# Patient Record
Sex: Male | Born: 1955 | Race: White | Hispanic: No | Marital: Married | Smoking: Never smoker
Health system: Southern US, Community
[De-identification: ages and names within clinical notes are randomized; demographics above are authoritative.]

## PROBLEM LIST (undated history)

## (undated) DIAGNOSIS — I10 Essential (primary) hypertension: Secondary | ICD-10-CM

## (undated) DIAGNOSIS — K219 Gastro-esophageal reflux disease without esophagitis: Secondary | ICD-10-CM

## (undated) DIAGNOSIS — L89154 Pressure ulcer of sacral region, stage 4: Secondary | ICD-10-CM

## (undated) DIAGNOSIS — N185 Chronic kidney disease, stage 5: Secondary | ICD-10-CM

## (undated) DIAGNOSIS — Z992 Dependence on renal dialysis: Secondary | ICD-10-CM

## (undated) DIAGNOSIS — R600 Localized edema: Secondary | ICD-10-CM

## (undated) DIAGNOSIS — E669 Obesity, unspecified: Secondary | ICD-10-CM

## (undated) DIAGNOSIS — L0390 Cellulitis, unspecified: Secondary | ICD-10-CM

## (undated) DIAGNOSIS — E119 Type 2 diabetes mellitus without complications: Secondary | ICD-10-CM

## (undated) DIAGNOSIS — I340 Nonrheumatic mitral (valve) insufficiency: Secondary | ICD-10-CM

## (undated) DIAGNOSIS — E785 Hyperlipidemia, unspecified: Secondary | ICD-10-CM

## (undated) DIAGNOSIS — F419 Anxiety disorder, unspecified: Secondary | ICD-10-CM

## (undated) DIAGNOSIS — I5189 Other ill-defined heart diseases: Secondary | ICD-10-CM

## (undated) DIAGNOSIS — I2510 Atherosclerotic heart disease of native coronary artery without angina pectoris: Secondary | ICD-10-CM

## (undated) DIAGNOSIS — R200 Anesthesia of skin: Secondary | ICD-10-CM

## (undated) DIAGNOSIS — D649 Anemia, unspecified: Secondary | ICD-10-CM

## (undated) HISTORY — DX: Type 2 diabetes mellitus without complications: E11.9

## (undated) HISTORY — PX: OTHER SURGICAL HISTORY: SHX169

## (undated) HISTORY — DX: Essential (primary) hypertension: I10

## (undated) HISTORY — DX: Cellulitis, unspecified: L03.90

## (undated) HISTORY — PX: COLOSTOMY REVERSAL: SHX5782

## (undated) HISTORY — PX: COLOSTOMY: SHX63

---

## 2010-07-28 ENCOUNTER — Ambulatory Visit: Payer: Self-pay | Admitting: Internal Medicine

## 2011-04-07 ENCOUNTER — Ambulatory Visit: Payer: Self-pay | Admitting: Internal Medicine

## 2011-04-30 ENCOUNTER — Ambulatory Visit: Payer: Self-pay | Admitting: Internal Medicine

## 2014-04-27 ENCOUNTER — Ambulatory Visit: Payer: Self-pay | Admitting: Gastroenterology

## 2014-04-30 LAB — PATHOLOGY REPORT

## 2016-10-07 ENCOUNTER — Encounter: Payer: BLUE CROSS/BLUE SHIELD | Attending: Internal Medicine | Admitting: Internal Medicine

## 2016-10-07 DIAGNOSIS — E781 Pure hyperglyceridemia: Secondary | ICD-10-CM | POA: Insufficient documentation

## 2016-10-07 DIAGNOSIS — L97211 Non-pressure chronic ulcer of right calf limited to breakdown of skin: Secondary | ICD-10-CM | POA: Diagnosis not present

## 2016-10-07 DIAGNOSIS — Z7984 Long term (current) use of oral hypoglycemic drugs: Secondary | ICD-10-CM | POA: Diagnosis not present

## 2016-10-07 DIAGNOSIS — E11622 Type 2 diabetes mellitus with other skin ulcer: Secondary | ICD-10-CM | POA: Diagnosis present

## 2016-10-07 DIAGNOSIS — I10 Essential (primary) hypertension: Secondary | ICD-10-CM | POA: Insufficient documentation

## 2016-10-07 DIAGNOSIS — I87311 Chronic venous hypertension (idiopathic) with ulcer of right lower extremity: Secondary | ICD-10-CM | POA: Diagnosis not present

## 2016-10-07 DIAGNOSIS — M792 Neuralgia and neuritis, unspecified: Secondary | ICD-10-CM | POA: Diagnosis not present

## 2016-10-07 DIAGNOSIS — L97311 Non-pressure chronic ulcer of right ankle limited to breakdown of skin: Secondary | ICD-10-CM | POA: Diagnosis not present

## 2016-10-08 NOTE — Progress Notes (Signed)
Lee Bryant, Lee Bryant ([id number]) Visit Report for 10/07/2016 Abuse/Suicide Risk Screen Details Lee Bryant Date of Service: 10/07/2016 9:00 AM Patient Name: G. Patient Account Number: [id number] Medical Record Treating RN: Baruch Gouty RN, BSN, [name] [id number] Number: Other Clinician: Date of Birth/Sex: Oct 23, 1955 (60 y.o. Male) Treating ROBSON, MICHAEL Primary Care Physician/Extender: Katherina Right, Vishwanath Physician: Referring Physician: Tracie Harrier Weeks in Treatment: 0 Abuse/Suicide Risk Screen Items Answer ABUSE/SUICIDE RISK SCREEN: Has anyone close to you tried to hurt or harm you recentlyo No Do you feel uncomfortable with anyone in your familyo No Has anyone forced you do things that you didnot want to doo No Do you have any thoughts of harming yourselfo No Patient displays signs or symptoms of abuse and/or neglect. No Electronic Signature(s) Signed: 10/07/2016 5:17:40 PM By: Regan Lemming BSN, RN Entered By: Regan Lemming on 10/07/2016 09:22:25 Lee Bryant ([id number]) -------------------------------------------------------------------------------- Activities of Daily Living Details Lee Bryant Date of Service: 10/07/2016 9:00 AM Patient Name: G. Patient Account Number: [id number] Medical Record Treating RN: Baruch Gouty, RN, BSN, [name] [id number] Number: Other Clinician: Date of Birth/Sex: August 01, 1956 (60 y.o. Male) Treating ROBSON, MICHAEL Primary Care Physician/Extender: Katherina Right, Vishwanath Physician: Referring Physician: Metta Clines in Treatment: 0 Activities of Daily Living Items Answer Activities of Daily Living (Please select one for each item) Drive Automobile Completely Able Take Medications Completely Able Use Telephone Completely Able Care for Appearance Completely Able Use Toilet Completely Able Bath / Shower Completely Able Dress Self Completely Able Feed Self Completely Able Walk Completely Able Get In / Out Bed Completely  Able Housework Completely Able Prepare Meals Completely Arial for Self Completely Able Electronic Signature(s) Signed: 10/07/2016 5:17:40 PM By: Regan Lemming BSN, RN Entered By: Regan Lemming on 10/07/2016 09:22:47 Lee Bryant ([id number]) -------------------------------------------------------------------------------- Education Assessment Details Lee Bryant Date of Service: 10/07/2016 9:00 AM Patient Name: G. Patient Account Number: [id number] Medical Record Treating RN: Baruch Gouty, RN, BSN, [name] [id number] Number: Other Clinician: Date of Birth/Sex: 07/20/1956 (60 y.o. Male) Treating ROBSON, MICHAEL Primary Care Physician/Extender: Katherina Right, Vishwanath Physician: Referring Physician: Metta Clines in Treatment: 0 Primary Learner Assessed: Patient Learning Preferences/Education Level/Primary Language Learning Preference: Explanation Highest Education Level: High School Preferred Language: English Cognitive Barrier Assessment/Beliefs Language Barrier: No Physical Barrier Assessment Impaired Vision: Yes Glasses Impaired Hearing: No Decreased Hand dexterity: No Knowledge/Comprehension Assessment Knowledge Level: High Comprehension Level: High Ability to understand written High instructions: Ability to understand verbal High instructions: Motivation Assessment Anxiety Level: Calm Cooperation: Cooperative Education Importance: Acknowledges Need Interest in Health Problems: Asks Questions Perception: Coherent Willingness to Engage in Self- High Management Activities: Readiness to Engage in Self- High Management Activities: Electronic Signature(s) Signed: 10/07/2016 5:17:40 PM By: Regan Lemming BSN, RN Lee Bryant ([id number]) Entered By: Regan Lemming on 10/07/2016 09:23:07 Lee Bryant ([id number]) -------------------------------------------------------------------------------- Fall Risk Assessment  Details Lee Bryant Date of Service: 10/07/2016 9:00 AM Patient Name: G. Patient Account Number: [id number] Medical Record Treating RN: Baruch Gouty, RN, BSN, [name] [id number] Number: Other Clinician: Date of Birth/Sex: 23-Oct-1955 (60 y.o. Male) Treating ROBSON, MICHAEL Primary Care Physician/Extender: Katherina Right, Vishwanath Physician: Referring Physician: Metta Clines in Treatment: 0 Fall Risk Assessment Items Have you had 2 or more falls in the last 12 monthso 0 No Have you had any fall that resulted in injury in the last 12 monthso 0 No FALL RISK ASSESSMENT: History of falling - immediate or within 3 months 0 No Secondary diagnosis 0 No Ambulatory aid None/bed rest/wheelchair/nurse 0 Yes Crutches/cane/walker 0  No Furniture 0 No IV Access/Saline Lock 0 No Gait/Training Normal/bed rest/immobile 0 Yes Weak 0 No Impaired 0 No Mental Status Oriented to own ability 0 Yes Electronic Signature(s) Signed: 10/07/2016 5:17:40 PM By: Regan Lemming BSN, RN Entered By: Regan Lemming on 10/07/2016 09:23:18 Lee Bryant ([id number]) -------------------------------------------------------------------------------- Foot Assessment Details Lee Bryant Date of Service: 10/07/2016 9:00 AM Patient Name: G. Patient Account Number: [id number] Medical Record Treating RN: Baruch Gouty, RN, BSN, [name] [id number] Number: Other Clinician: Date of Birth/Sex: 09/12/1956 (60 y.o. Male) Treating ROBSON, MICHAEL Primary Care Physician/Extender: Katherina Right, Hughson Physician: Referring Physician: Metta Clines in Treatment: 0 Foot Assessment Items Site Locations + = Sensation present, - = Sensation absent, C = Callus, U = Ulcer R = Redness, W = Warmth, M = Maceration, PU = Pre-ulcerative lesion F = Fissure, S = Swelling, D = Dryness Assessment Right: Left: Other Deformity: No No Prior Foot Ulcer: No No Prior Amputation: No No Charcot Joint: No No Ambulatory Status: Ambulatory  Without Help Gait: Steady Electronic Signature(s) Signed: 10/07/2016 5:17:40 PM By: Regan Lemming BSN, RN Entered By: Regan Lemming on 10/07/2016 09:23:59 Lee Bryant ([id number]) Sandra Cockayne, Cyndia Diver ([id number]) -------------------------------------------------------------------------------- Nutrition Risk Assessment Details Lee Bryant Date of Service: 10/07/2016 9:00 AM Patient Name: G. Patient Account Number: [id number] Medical Record Treating RN: Baruch Gouty, RN, BSN, [name] [id number] Number: Other Clinician: Date of Birth/Sex: 06-May-1956 (60 y.o. Male) Treating ROBSON, MICHAEL Primary Care Physician/Extender: Katherina Right, Vishwanath Physician: Referring Physician: Tracie Harrier Weeks in Treatment: 0 Height (in): Weight (lbs): 273 Body Mass Index (BMI): Nutrition Risk Assessment Items NUTRITION RISK SCREEN: I have an illness or condition that made me change the kind and/or 0 No amount of food I eat I eat fewer than two meals per day 0 No I eat few fruits and vegetables, or milk products 0 No I have three or more drinks of beer, liquor or wine almost every day 0 No I have tooth or mouth problems that make it hard for me to eat 0 No I don't always have enough money to buy the food I need 0 No I eat alone most of the time 0 No I take three or more different prescribed or over-the-counter drugs a 0 No day Without wanting to, I have lost or gained 10 pounds in the last six 0 No months I am not always physically able to shop, cook and/or feed myself 0 No Nutrition Protocols Good Risk Protocol 0 No interventions needed Moderate Risk Protocol Electronic Signature(s) Signed: 10/07/2016 5:17:40 PM By: Regan Lemming BSN, RN Entered By: Regan Lemming on 10/07/2016 22:48:25

## 2016-10-08 NOTE — Progress Notes (Signed)
HARLOW, CARRIZALES ([id number]) Visit Report for 10/07/2016 Chief Complaint Document Details EKANSH, SHERK Date of Service: 10/07/2016 9:00 AM Patient Name: G. Patient Account Number: [id number] Medical Record Treating RN: Baruch Gouty RN, BSN, [name] [id number] Number: Other Clinician: Date of Birth/Sex: 03-Aug-1956 (60 y.o. Male) Treating Linton Ham Primary Care Physician/Extender: Katherina Right, Vishwanath Physician: Referring Physician: Metta Clines in Treatment: 0 Information Obtained from: Patient Chief Complaint 10/07/16; the patient is here for review of 2 wounds on the right anterior lower leg and the right medial ankle. Electronic Signature(s) Signed: 10/07/2016 5:40:41 PM By: Linton Ham MD Entered By: Linton Ham on 10/07/2016 11:07:18 [name] ([id number]) -------------------------------------------------------------------------------- Debridement Details Lee Bryant Date of Service: 10/07/2016 9:00 AM Patient Name: G. Patient Account Number: [id number] Medical Record Treating RN: Baruch Gouty, RN, BSN, [name] [id number] Number: Other Clinician: Date of Birth/Sex: 11-20-1955 (60 y.o. Male) Treating Laurian Edrington Primary Care Physician/Extender: Katherina Right, Vishwanath Physician: Referring Physician: Metta Clines in Treatment: 0 Debridement Performed for Wound #1 Right,Anterior Lower Leg Assessment: Performed By: Physician Ricard Dillon, MD Debridement: Debridement Pre-procedure Yes - 10:10 Verification/Time Out Taken: Start Time: 10:10 Pain Control: Lidocaine 4% Topical Solution Level: Skin/Subcutaneous Tissue Total Area Debrided (L x 3 (cm) x 1.5 (cm) = 4.5 (cm) W): Tissue and other Non-Viable, Exudate, Fibrin/Slough, Subcutaneous material debrided: Instrument: Curette Bleeding: Minimum Hemostasis Achieved: Pressure End Time: 10:14 Procedural Pain: 0 Post Procedural Pain: 0 Response to Treatment: Procedure was  tolerated well Post Debridement Measurements of Total Wound Length: (cm) 3 Width: (cm) 1.5 Depth: (cm) 0.4 Volume: (cm) 1.414 Character of Wound/Ulcer Post Requires Further Debridement Debridement: Severity of Tissue Post Debridement: Fat layer exposed Post Procedure Diagnosis Same as Pre-procedure Electronic Signature(s) Signed: 10/07/2016 5:17:40 PM By: Regan Lemming BSN, RN [name] ([id number]) Signed: 10/07/2016 5:40:41 PM By: Linton Ham MD Entered By: Linton Ham on 10/07/2016 11:06:34 [name] ([id number]) -------------------------------------------------------------------------------- HPI Details Lee Bryant Date of Service: 10/07/2016 9:00 AM Patient Name: G. Patient Account Number: [id number] Medical Record Treating RN: Baruch Gouty RN, BSN, [name] [id number] Number: Other Clinician: Date of Birth/Sex: 1956/02/21 (60 y.o. Male) Treating Linton Ham Primary Care Physician/Extender: Katherina Right, Vishwanath Physician: Referring Physician: Metta Clines in Treatment: 0 History of Present Illness HPI Description: 10/07/16; patient is a 60 year old male type II diabetic on oral agents. He was apparently cleaning out his pond and traumatized his right anterior leg on since late roughly 3-4 months ago. 3 days later he noted an open area on the right anterior leg. Exact course of this is not completely clear or resolve his primary physician on 12/15. A culture was done and according to the patient a culture was done and Cipro was prescribed at 500 mg twice a day. Do not have the exact culture results. Also his primary physician arranged arterial studies for after the holidays which seems reasonable. Within the last week or 2 secondary wound has come up on the right medial malleoli area. Again the cause of this is not completely clear he does not complain of trauma. ABIs in this clinic was 0.9 bilaterally. He does not give a history  of claudication. He has a history of a traumatic wound on the left anterior leg many years ago which healed. For the Cipro he appears to bring given a course of clindamycin. Recently his has been treating this with an ointment called Terrasil which we googled to find it is a combination of multiple products including silver, zinc oxide. They claim that that  is done a lot for helping improve this wound. In reviewing his primary [facility] notes occur [facility] from 12/15 he is listed as having type 2 diabetes without complication, essential hypertension, hypertriglyceridemia and neuralgia/neuritis unspecified Electronic Signature(s) Signed: 10/07/2016 5:40:41 PM By: Linton Ham MD Entered By: Linton Ham on 10/07/2016 11:12:55 [name] ([id number]) -------------------------------------------------------------------------------- Physical Exam Details Lee Bryant Date of Service: 10/07/2016 9:00 AM Patient Name: G. Patient Account Number: [id number] Medical Record Treating RN: Baruch Gouty RN, BSN, [name] [id number] Number: Other Clinician: Date of Birth/Sex: [birth year] (60 y.o. Male) Treating Turhan Chill Primary Care Physician/Extender: Katherina Right, Vishwanath Physician: Referring Physician: Tracie Harrier Weeks in Treatment: 0 Constitutional Sitting or standing Blood Pressure is within target range for patient.. Pulse regular and within target range for patient.Marland Kitchen Respirations regular, non-labored and within target range.. Temperature is normal and within the target range for the patient.. Patient's appearance is neat and clean. Appears in no acute distress. Well nourished and well developed.. Eyes Conjunctivae clear. No discharge.Marland Kitchen Respiratory Respiratory effort is easy and symmetric bilaterally. Rate is normal at rest and on room air.. Bilateral breath sounds are clear and equal in all lobes with no wheezes, rales or rhonchi.. Cardiovascular Heart rhythm and  rate regular, without murmur or gallop.. Femoral arteries without bruits and pulses strong.. Pedal pulses palpable and strong bilaterally.. Edema present in both extremities. this is mild. Some degree of venous insufficiency. Gastrointestinal (GI) Abdomen is soft and non-distended without masses or tenderness. Bowel sounds active in all quadrants.. Lymphatic Nonpalpable in the popliteal or inguinal area. Notes Wound exam; the patient has 2 small wounds on the right anterior leg. Both of the areas are covered in an adherent yellow surface slough that requires debridement of this and nonviable subcutaneous tissue. The larger wound actually cleans up better than the smaller one above it. There is no evidence of surrounding infection. oIn the medial right ankle there is a small area again with the same adherent yellow surface slough. The patient reiterates that this is a more recent wound perhaps in the last week or 2. Electronic Signature(s) Signed: 10/07/2016 5:40:41 PM By: Linton Ham MD Entered By: Linton Ham on 10/07/2016 11:15:16 [name] ([id number]) -------------------------------------------------------------------------------- Physician Orders Details Lee Bryant Date of Service: 10/07/2016 9:00 AM Patient Name: G. Patient Account Number: [id number] Medical Record Treating RN: Baruch Gouty RN, BSN, [name] [id number] Number: Other Clinician: Date of Birth/Sex: December 03, 1955 (60 y.o. Male) Treating Aneesah Hernan Primary Care Physician/Extender: Katherina Right, Vishwanath Physician: Referring Physician: Metta Clines in Treatment: 0 Verbal / Phone Orders: Yes Clinician: Afful, RN, BSN, Rita Read Back and Verified: Yes Diagnosis Coding Wound Cleansing Wound #1 Right,Anterior Lower Leg o Cleanse wound with mild soap and water o May Shower, gently pat wound dry prior to applying new dressing. Wound #2 Right,Medial Malleolus o Cleanse wound with mild  soap and water o May Shower, gently pat wound dry prior to applying new dressing. Anesthetic Wound #1 Right,Anterior Lower Leg o Topical Lidocaine 4% cream applied to wound bed prior to debridement Wound #2 Right,Medial Malleolus o Topical Lidocaine 4% cream applied to wound bed prior to debridement Primary Wound Dressing Wound #1 Right,Anterior Lower Leg o Santyl Ointment Wound #2 Right,Medial Malleolus o Santyl Ointment Secondary Dressing Wound #1 Right,Anterior Lower Leg o Dry Gauze - 2x2 o Other - Coverlette Wound #2 Right,Medial Malleolus o Dry Gauze - 2x2 o Other - Coverlette Dressing Change Frequency Dillinger, Tarez G. ([id number]) Wound #1 Right,Anterior Lower Leg o Change dressing every  day. Wound #2 Right,Medial Malleolus o Change dressing every day. Follow-up Appointments Wound #1 Right,Anterior Lower Leg o Return Appointment in 1 week. Wound #2 Right,Medial Malleolus o Return Appointment in 1 week. Edema Control Wound #1 Right,Anterior Lower Leg o 3 Layer Compression System - Right Lower Extremity o Patient to wear own Juxtalite/Juzo compression garment. - order Wound #2 Right,Medial Malleolus o 3 Layer Compression System - Right Lower Extremity o Patient to wear own Juxtalite/Juzo compression garment. - order Additional Orders / Instructions Wound #1 Right,Anterior Lower Leg o Increase protein intake. o Activity as tolerated Wound #2 Right,Medial Malleolus o Increase protein intake. o Activity as tolerated Electronic Signature(s) Signed: 10/07/2016 5:17:40 PM By: Regan Lemming BSN, RN Signed: 10/07/2016 5:40:41 PM By: Linton Ham MD Entered By: Regan Lemming on 10/07/2016 10:15:55 [name] ([id number]) -------------------------------------------------------------------------------- Problem List Details Lee Bryant Date of Service: 10/07/2016 9:00 AM Patient Name: G. Patient Account Number:  [id number] Medical Record Treating RN: Baruch Gouty RN, BSN, [name] [id number] Number: Other Clinician: Date of Birth/Sex: 08/23/1956 (60 y.o. 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 Patient Account Number: [id number] Medical Record Treating RN: Baruch Gouty RN, BSN, [name] [id number] Number: Other Clinician: Date of Birth/Sex: 1955-10-23 (60 y.o. Male) Treating Linton Ham Primary Care Physician/Extender: Katherina Right, Vishwanath Physician: Referring Physician: Metta Clines in Treatment: 0 Subjective Chief Complaint Information obtained from Patient 10/07/16; the patient is here for review of 2 wounds on the right anterior lower leg and the right medial ankle. History of Present Illness (HPI) 10/07/16; patient is a 60 year old male type II diabetic on oral agents. He was apparently cleaning out his pond and traumatized his right anterior leg on since late roughly 3-4 months ago. 3 days later he noted an open area on the right anterior leg. Exact course of this is not completely  clear or resolve his primary physician on 12/15. A culture was done and according to the patient a culture was done and Cipro was prescribed at 500 mg twice a day. Do not have the exact culture results. Also his primary physician arranged arterial studies for after the holidays which seems reasonable. Within the last week or 2 secondary wound has come up on the right medial malleoli area. Again the cause of this is not completely clear he does not complain of trauma. ABIs in this clinic was 0.9 bilaterally. He does not give a history of claudication. He has a history of a traumatic wound on the left anterior leg many years ago which healed. For the Cipro he appears to bring given a course of clindamycin. Recently his has been treating this with an ointment called Terrasil which we googled to find it is a combination of multiple products including silver, zinc oxide. They claim that that is done a lot for helping improve this wound. In reviewing his primary 74  notes occur [facility] from 12/15 he is listed as having type 2 diabetes without complication, essential hypertension, hypertriglyceridemia and neuralgia/neuritis unspecified Wound History Patient presents with 2 open wounds that have been present for approximately 3-51months. Patient has been treating wounds in the following manner: terrasil. Laboratory tests have not been performed in the last month. Patient reportedly has not tested positive for an antibiotic resistant organism. Patient reportedly has not tested positive for osteomyelitis. Patient reportedly has not had testing performed to evaluate circulation in the legs. Patient experiences the following problems associated with their wounds: infection, swelling. Patient History Information obtained from Patient. KHYREE, CARILLO ([id number]) Allergies Keflex Family History Cancer - Mother, Father, Diabetes - Siblings, Heart Disease - Father, Hypertension - Mother, No  family history of Hereditary Spherocytosis, Kidney Disease, Lung Disease, Seizures, Stroke, Thyroid Problems, Tuberculosis. Social History Never smoker, Marital Status - Married, Alcohol Use - Rarely, Drug Use - No History, Caffeine Use - Moderate. Medical History Ear/Nose/Mouth/Throat Denies history of Chronic sinus problems/congestion, Middle ear problems Hematologic/Lymphatic Denies history of Anemia, Hemophilia, Human Immunodeficiency Virus, Lymphedema, Sickle Cell Disease Respiratory Denies history of Aspiration, Asthma, Chronic Obstructive Pulmonary Disease (COPD), Pneumothorax, Sleep Apnea, Tuberculosis Cardiovascular Patient has history of Hypertension Gastrointestinal Denies history of Cirrhosis , Colitis, Crohn s, Hepatitis A, Hepatitis B, Hepatitis C Endocrine Patient has history of Type II Diabetes - 5 years Genitourinary Denies history of End Stage Renal Disease Immunological Denies history of Lupus Erythematosus, Raynaud s, Scleroderma Integumentary (Skin) Denies history of History of Burn, History of pressure wounds Musculoskeletal Denies history of Gout, Rheumatoid Arthritis, Osteoarthritis, Osteomyelitis Neurologic Denies history of Dementia, Neuropathy, Quadriplegia, Paraplegia, Seizure Disorder Oncologic Denies history of Received Chemotherapy, Received Radiation Psychiatric Denies history of Anorexia/bulimia, Confinement Anxiety Patient is treated with Oral Agents. Blood sugar is not tested. Blood sugar results noted at the following times: Breakfast - 167. Review of Systems (ROS) Constitutional Symptoms (General Health) The patient has no complaints or symptoms. Eyes Complains or has symptoms of Glasses / Contacts. Ear/Nose/Mouth/Throat [name] ([id number]) The patient has no complaints or symptoms. Hematologic/Lymphatic The patient has no complaints or symptoms. Respiratory The patient has no complaints or  symptoms. Cardiovascular Complains or has symptoms of LE edema. Gastrointestinal The patient has no complaints or symptoms. Endocrine The patient has no complaints or symptoms. Genitourinary The patient has no complaints or symptoms. Immunological The patient has no complaints or symptoms. Integumentary (Skin) Complains or has symptoms of Wounds, Swelling. Musculoskeletal The patient has no complaints or symptoms. Neurologic Complains or has symptoms of Numbness/parasthesias - fingers. Denies complaints or symptoms of Focal/Weakness. Oncologic The patient has no complaints or symptoms. Psychiatric The patient has no complaints or symptoms. Objective Constitutional Sitting or standing Blood Pressure is within target range for patient.. Pulse regular and within target range for patient.Marland Kitchen Respirations regular, non-labored and within target range.. Temperature is normal and within the target range for the patient.. Patient's appearance is neat and clean. Appears in no acute distress. Well nourished and well developed.. Vitals Time Taken: 9:20 AM, Source: Stated, Weight: 273 lbs, Source: Stated, Temperature: 97.6 F, Pulse: 103 bpm, Respiratory Rate: 20 breaths/min, Blood Pressure: 167/86 mmHg. Eyes Conjunctivae clear. No discharge.Marland Kitchen Respiratory Respiratory effort is easy and symmetric bilaterally. Rate is normal at rest and on room air.. Bilateral breath sounds are clear and equal in all lobes with no wheezes, rales or rhonchi.Marland Kitchen DAROL, CUSH ([id number]) Cardiovascular Heart rhythm and rate regular, without murmur or gallop.. Femoral arteries without bruits  and pulses strong.. Pedal pulses palpable and strong bilaterally.. Edema present in both extremities. this is mild. Some degree of venous insufficiency. Gastrointestinal (GI) Abdomen is soft and non-distended without masses or tenderness. Bowel sounds active in all quadrants.. Lymphatic Nonpalpable in the popliteal or  inguinal area. General Notes: Wound exam; the patient has 2 small wounds on the right anterior leg. Both of the areas are covered in an adherent yellow surface slough that requires debridement of this and nonviable subcutaneous tissue. The larger wound actually cleans up better than the smaller one above it. There is no evidence of surrounding infection. In the medial right ankle there is a small area again with the same adherent yellow surface slough. The patient reiterates that this is a more recent wound perhaps in the last week or 2. Integumentary (Hair, Skin) Wound #1 status is Open. Original cause of wound was Gradually Appeared. The wound is located on the Right,Anterior Lower Leg. The wound measures 3cm length x 1.5cm width x 0.4cm depth; 3.534cm^2 area and 1.414cm^3 volume. The wound is limited to skin breakdown. There is no tunneling or undermining noted. There is a medium amount of serosanguineous drainage noted. The wound margin is distinct with the outline attached to the wound base. There is no granulation within the wound bed. There is a large (67- 100%) amount of necrotic tissue within the wound bed including Adherent Slough. The periwound skin appearance exhibited: Localized Edema, Moist, Mottled. The periwound skin appearance did not exhibit: Callus, Crepitus, Excoriation, Fluctuance, Friable, Induration, Rash, Scarring, Dry/Scaly, Maceration, Atrophie Blanche, Cyanosis, Ecchymosis, Hemosiderin Staining, Pallor, Rubor, Erythema. Periwound temperature was noted as No Abnormality. The periwound has tenderness on palpation. Wound #2 status is Open. Original cause of wound was Gradually Appeared. The wound is located on the Right,Medial Malleolus. The wound measures 0.5cm length x 0.5cm width x 0.1cm depth; 0.196cm^2 area and 0.02cm^3 volume. The wound is limited to skin breakdown. There is no tunneling or undermining noted. There is a small amount of serous drainage noted. The  wound margin is distinct with the outline attached to the wound base. There is small (1-33%) pink, pale granulation within the wound bed. There is a medium (34-66%) amount of necrotic tissue within the wound bed including Adherent Slough. The periwound skin appearance exhibited: Localized Edema, Moist, Mottled. The periwound skin appearance did not exhibit: Callus, Crepitus, Excoriation, Fluctuance, Friable, Induration, Rash, Scarring, Dry/Scaly, Maceration, Atrophie Blanche, Cyanosis, Ecchymosis, Hemosiderin Staining, Pallor, Rubor, Erythema. Periwound temperature was noted as No Abnormality. The periwound has tenderness on palpation. 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 There was a Skin/Subcutaneous Tissue Debridement ([id number]) debridement with total area of 4.5 sq cm performed by Ricard Dillon, MD. with the following instrument(s): Curette to remove Non- Viable tissue/material including Exudate, Fibrin/Slough, and Subcutaneous after achieving pain control using Lidocaine 4% Topical Solution. A time out was conducted at 10:10, prior to the start of the procedure. A Minimum amount of bleeding was controlled with Pressure. The procedure was tolerated well with a pain level of 0 throughout and a pain level of 0 following the  procedure. Post Debridement Measurements: 3cm length x 1.5cm width x 0.4cm depth; 1.414cm^3 volume. Character of Wound/Ulcer Post Debridement requires further debridement. Severity of Tissue Post Debridement is: Fat  layer exposed. Post procedure Diagnosis Wound #1: Same as Pre-Procedure Plan Wound Cleansing: Wound #1 Right,Anterior Lower Leg: Cleanse wound with mild soap and water May Shower, gently pat wound dry prior to applying new dressing. Wound #2 Right,Medial Malleolus: Cleanse wound with mild soap and water May Shower, gently pat wound dry prior to applying new dressing. Anesthetic: Wound #1 Right,Anterior Lower Leg: Topical Lidocaine 4% cream applied to wound bed prior to debridement Wound #2 Right,Medial Malleolus: Topical Lidocaine 4% cream applied to wound bed prior to debridement Primary Wound Dressing: Wound #1 Right,Anterior Lower Leg: Santyl Ointment Wound #2 Right,Medial Malleolus: Santyl Ointment Secondary Dressing: NACHUM, DEROSSETT ([id number]) Wound #1 Right,Anterior Lower Leg: Dry Gauze - 2x2 Other - Coverlette Wound #2 Right,Medial Malleolus: Dry Gauze - 2x2 Other - Coverlette Dressing Change Frequency: Wound #1 Right,Anterior Lower Leg: Change dressing every day. Wound #2 Right,Medial Malleolus: Change dressing every day. Follow-up Appointments: Wound #1 Right,Anterior Lower Leg: Return Appointment in 1 week. Wound #2 Right,Medial Malleolus: Return Appointment in 1 week. Edema Control: Wound #1 Right,Anterior Lower Leg: 3 Layer Compression System - Right Lower Extremity Patient to wear own Juxtalite/Juzo compression garment. - order Wound #2 Right,Medial Malleolus: 3 Layer Compression System - Right Lower Extremity Patient to wear own Juxtalite/Juzo compression garment. - order Additional Orders / Instructions: Wound #1 Right,Anterior Lower Leg: Increase protein intake. Activity as tolerated Wound #2 Right,Medial Malleolus: Increase protein intake. Activity as tolerated #1 after some discussion we elected to place Santyl and all the wounds, gauze with a Profore light wrap. #2 both arterial and venous studies seem reasonable in this patient apparently  his primary physician is already arranging days. #3 follow-up in one week #4 exact pathogenesis of these areas is not completely clear. These are small punched out areas which would be atypical for. Venous wounds. The screening for arterial insufficiency seems reasonable although his peripheral pulses are easily palpable and his capillary refill time seems normal. The patient gives a history of the areas on the right anterior leg being traumatic although they seem to actually occur 3-4 days after the actual injury ARLO, BUTT ([id number]) Electronic Signature(s) Signed: 10/07/2016 5:40:41 PM By: Linton Ham MD Entered By: Linton Ham on 10/07/2016 11:17:50 [name] ([id number]) -------------------------------------------------------------------------------- ROS/PFSH Details Lee Bryant Date of Service: 10/07/2016 9:00 AM Patient Name: G. Patient Account Number: [id number] Medical Record Treating RN: Baruch Gouty, RN, BSN, [name] [id number] Number: Other Clinician: Date of Birth/Sex: [birth year] (60 y.o. Male) Treating Marisa Hufstetler Primary Care Physician/Extender: Katherina Right, Vishwanath Physician: Referring Physician: Tracie Harrier Weeks in Treatment: 0 Information Obtained From Patient Wound History Do you currently have one or more open woundso Yes How many open wounds do you currently haveo 2 Approximately how long have you had your woundso 3-61months How have you been treating your wound(s) until nowo terrasil Has your wound(s) ever healed and then re-openedo No Have you had any lab work done in the past montho No Have you tested positive for an antibiotic resistant organism (MRSA, VRE)o No Have you tested positive for osteomyelitis (bone infection)o No Have you had any tests for circulation on your legso No Have you had other problems associated with your woundso Infection, Swelling Eyes Complaints and Symptoms: Positive for: Glasses /  Contacts Cardiovascular Complaints and Symptoms: Positive for: LE edema Medical History: Positive  for: Hypertension Integumentary (Skin) Complaints and Symptoms: Positive for: Wounds; Swelling Medical History: Negative for: History of Burn; History of pressure wounds Neurologic Complaints and Symptoms: Positive for: Numbness/parasthesias - fingers Lusty, Jejuan G. ([id number]) Negative for: Focal/Weakness Medical History: Negative for: Dementia; Neuropathy; Quadriplegia; Paraplegia; Seizure Disorder Constitutional Symptoms (General Health) Complaints and Symptoms: No Complaints or Symptoms Ear/Nose/Mouth/Throat Complaints and Symptoms: No Complaints or Symptoms Medical History: Negative for: Chronic sinus problems/congestion; Middle ear problems Hematologic/Lymphatic Complaints and Symptoms: No Complaints or Symptoms Medical History: Negative for: Anemia; Hemophilia; Human Immunodeficiency Virus; Lymphedema; Sickle Cell Disease Respiratory Complaints and Symptoms: No Complaints or Symptoms Medical History: Negative for: Aspiration; Asthma; Chronic Obstructive Pulmonary Disease (COPD); Pneumothorax; Sleep Apnea; Tuberculosis Gastrointestinal Complaints and Symptoms: No Complaints or Symptoms Medical History: Negative for: Cirrhosis ; Colitis; Crohnos; Hepatitis A; Hepatitis B; Hepatitis C Endocrine Complaints and Symptoms: No Complaints or Symptoms Medical History: Positive for: Type II Diabetes - 5 years Time with diabetes: 5 years AZUL, BRUMETT ([id number]) Treated with: Oral agents Blood sugar tested every day: No Blood sugar testing results: Breakfast: 167 Genitourinary Complaints and Symptoms: No Complaints or Symptoms Medical History: Negative for: End Stage Renal Disease Immunological Complaints and Symptoms: No Complaints or Symptoms Medical History: Negative for: Lupus Erythematosus; Raynaudos; Scleroderma Musculoskeletal Complaints and  Symptoms: No Complaints or Symptoms Medical History: Negative for: Gout; Rheumatoid Arthritis; Osteoarthritis; Osteomyelitis Oncologic Complaints and Symptoms: No Complaints or Symptoms Medical History: Negative for: Received Chemotherapy; Received Radiation Psychiatric Complaints and Symptoms: No Complaints or Symptoms Medical History: Negative for: Anorexia/bulimia; Confinement Anxiety Immunizations Pneumococcal Vaccine: Received Pneumococcal Vaccination: No Family and Social History LOYAL, HOLZHEIMER ([id number]) Cancer: Yes - Mother, Father; Diabetes: Yes - Siblings; Heart Disease: Yes - Father; Hereditary Spherocytosis: No; Hypertension: Yes - Mother; Kidney Disease: No; Lung Disease: No; Seizures: No; Stroke: No; Thyroid Problems: No; Tuberculosis: No; Never smoker; Marital Status - Married; Alcohol Use: Rarely; Drug Use: No History; Caffeine Use: Moderate; Financial Concerns: No; Food, Clothing or Shelter Needs: No; Support System Lacking: No; Transportation Concerns: No; Advanced Directives: No; Living Will: No Electronic Signature(s) Signed: 10/07/2016 5:17:40 PM By: Regan Lemming BSN, RN Signed: 10/07/2016 5:40:41 PM By: Linton Ham MD Entered By: Regan Lemming on 10/07/2016 09:31:27 [name] ([id number]) -------------------------------------------------------------------------------- SuperBill Details Lee Bryant Date of Service: 10/07/2016 Patient Name: G. Patient Account Number: [id number] Medical Record Treating RN: Baruch Gouty RN, BSN, [name] [id number] Number: Other Clinician: Date of Birth/Sex: 04/17/1956 (60 y.o. 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 (idiopathic) with ulcer of right lower extremity Facility Procedures CPT4 Code Description: [id number] 99213 - WOUND CARE VISIT-LEV 3 EST PT Modifier: Quantity: 1 CPT4 Code Description: [id number] 11042 - DEB SUBQ TISSUE 20 SQ CM/< ICD-10 Description Diagnosis L97.211 Non-pressure chronic ulcer of right calf limited to 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 to Modifier: breakdown of breakdown of Quantity: 1 skin skin Physician Procedures CPT4 Code Description: [id number] WC PHYS LEVEL 3 o NEW PT ICD-10 Description Diagnosis L97.211 Non-pressure chronic ulcer of right calf limited to E11.622 Type 2 diabetes mellitus with other skin ulcer Modifier: 25 breakdown of Quantity: 1 skin CPT4 Code Description: [id number] 11042 - WC PHYS SUBQ TISS  20 SQ CM ICD-10 Description Diagnosis L97.211 Non-pressure chronic ulcer of right calf limited to 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

## 2016-10-08 NOTE — Progress Notes (Signed)
TYLIN, FORCE ([id number]) Visit Report for 10/07/2016 Allergy List Details ALGIS, LEHENBAUER Date of Service: 10/07/2016 9:00 AM Patient Name: G. Patient Account Number: [id number] Medical Record Treating RN: Baruch Gouty RN, BSN, [name] [id number] Number: Other Clinician: Date of Birth/Sex: 1956/05/12 (60 y.o. Male) Treating Linton Ham Primary Care Physician: Tracie Harrier Physician/Extender: G Referring Physician: Tracie Harrier Weeks in Treatment: 0 Allergies Active Allergies Keflex Allergy Notes Electronic Signature(s) Signed: 10/07/2016 5:17:40 PM By: Regan Lemming BSN, RN Entered By: Regan Lemming on 10/07/2016 09:24:40 [name] ([id number]) -------------------------------------------------------------------------------- Arrival Information Details Lee Bryant Date of Service: 10/07/2016 9:00 AM Patient Name: G. Patient Account Number: [id number] Medical Record Treating RN: Baruch Gouty RN, BSN, [name] [id number] Number: Other Clinician: Date of Birth/Sex: August 10, 1956 (60 y.o. Male) Treating Linton Ham Primary Care Physician: Tracie Harrier Physician/Extender: G Referring Physician: Metta Clines in Treatment: 0 Visit Information Patient Arrived: Ambulatory Arrival Time: 09:18 Accompanied By: wife Transfer Assistance: None Patient Identification Verified: Yes Secondary Verification Process Yes Completed: Patient Requires Transmission-Based No Precautions: Patient Has Alerts: No Electronic Signature(s) Signed: 10/07/2016 5:17:40 PM By: Regan Lemming BSN, RN Entered By: Regan Lemming on 10/07/2016 09:19:48 [name] ([id number]) -------------------------------------------------------------------------------- Clinic Level of Care Assessment Details Lee Bryant Date of Service: 10/07/2016 9:00 AM Patient Name: G. Patient Account Number: [id number] Medical Record Treating RN: Baruch Gouty, RN, BSN, [name] [id number] Number: Other  Clinician: Date of Birth/Sex: 1956/06/07 (60 y.o. Male) Treating Linton Ham Primary Care Physician: Tracie Harrier Physician/Extender: G Referring Physician: Metta Clines in Treatment: 0 Clinic Level of Care Assessment Items TOOL 1 Quantity Score []  - Use when EandM and Procedure is performed on INITIAL visit 0 ASSESSMENTS - Nursing Assessment / Reassessment X - General Physical Exam (combine w/ comprehensive assessment (listed just 1 20 below) when performed on new pt. evals) X - Comprehensive Assessment (HX, ROS, Risk Assessments, Wounds Hx, etc.) 1 25 ASSESSMENTS - Wound and Skin Assessment / Reassessment []  - Dermatologic / Skin Assessment (not related to wound area) 0 ASSESSMENTS - Ostomy and/or Continence Assessment and Care []  - Incontinence Assessment and Management 0 []  - Ostomy Care Assessment and Management (repouching, etc.) 0 PROCESS - Coordination of Care X - Simple Patient / Family Education for ongoing care 1 15 []  - Complex (extensive) Patient / Family Education for ongoing care 0 X - Staff obtains Programmer, systems, Records, Test Results / Process Orders 1 10 []  - Staff telephones HHA, Nursing Homes / Clarify orders / etc 0 []  - Routine Transfer to another Facility (non-emergent condition) 0 []  - Routine Hospital Admission (non-emergent condition) 0 X - New Admissions / Biomedical engineer / Ordering NPWT, Apligraf, etc. 1 15 []  - Emergency Hospital Admission (emergent condition) 0 PROCESS - Special Needs []  - Pediatric / Minor Patient Management 0 JALIEN, WEAKLAND. ([id number]) []  - Isolation Patient Management 0 []  - Hearing / Language / Visual special needs 0 []  - Assessment of Community assistance (transportation, D/C planning, etc.) 0 []  - Additional assistance / Altered mentation 0 []  - Support Surface(s) Assessment (bed, cushion, seat, etc.) 0 INTERVENTIONS - Miscellaneous []  - External ear exam 0 []  - Patient Transfer (multiple staff /  Civil Service fast streamer / Similar devices) 0 []  - Simple Staple / Suture removal (25 or less) 0 []  - Complex Staple / Suture removal (26 or more) 0 []  - Hypo/Hyperglycemic Management (do not check if billed separately) 0 X - Ankle / Brachial Index (ABI) - do not check if billed separately 1 15 Has the patient been  seen at the hospital within the last three years: Yes Total Score: 100 Level Of Care: New/Established - Level 3 Electronic Signature(s) Signed: 10/07/2016 5:17:40 PM By: Regan Lemming BSN, RN Entered By: Regan Lemming on 10/07/2016 10:16:20 [name] ([id number]) -------------------------------------------------------------------------------- Encounter Discharge Information Details Lee Bryant Date of Service: 10/07/2016 9:00 AM Patient Name: G. Patient Account Number: [id number] Medical Record Treating RN: Baruch Gouty, RN, BSN, [name] [id number] Number: Other Clinician: Date of Birth/Sex: April 30, 1956 (60 y.o. Male) Treating Linton Ham Primary Care Physician: Tracie Harrier Physician/Extender: G Referring Physician: Metta Clines in Treatment: 0 Encounter Discharge Information Items Discharge Pain Level: 0 Discharge Condition: Stable Ambulatory Status: Ambulatory Discharge Destination: Home Private Transportation: Auto Accompanied By: wife Schedule Follow-up Appointment: No Medication Reconciliation completed and No provided to Patient/Care Reizel Calzada: Clinical Summary of Care: Electronic Signature(s) Signed: 10/07/2016 5:17:40 PM By: Regan Lemming BSN, RN Previous Signature: 10/07/2016 10:33:29 AM Version By: Ruthine Dose Entered By: Regan Lemming on 10/07/2016 10:36:48 [name] ([id number]) -------------------------------------------------------------------------------- Lower Extremity Assessment Details Lee Bryant Date of Service: 10/07/2016 9:00 AM Patient Name: G. Patient Account Number: [id number] Medical Record Treating RN: Baruch Gouty, RN,  BSN, [name] [id number] Number: Other Clinician: Date of Birth/Sex: [birth year] (60 y.o. Male) Treating ROBSON, [place] Primary Care Physician: Tracie Harrier Physician/Extender: G Referring Physician: Tracie Harrier Weeks in Treatment: 0 Edema Assessment Assessed: [Left: No] [Right: No] E[Left: dema] [Right: :] Calf Left: Right: Point of Measurement: 37 cm From Medial Instep 42.5 cm 42.6 cm Ankle Left: Right: Point of Measurement: 9 cm From Medial Instep 28.2 cm 28.6 cm Vascular Assessment Claudication: Claudication Assessment [Left:None] [Right:None] Pulses: Dorsalis Pedis Palpable: [Left:Yes] [Right:Yes] Posterior Tibial Extremity colors, hair growth, and conditions: Extremity Color: [Left:Mottled] [Right:Mottled] Hair Growth on Extremity: [Left:Yes] [Right:Yes] Temperature of Extremity: [Left:Warm] [Right:Warm] Capillary Refill: [Left:< 3 seconds] [Right:< 3 seconds] Blood Pressure: Brachial: [Left:168] [Right:167] Dorsalis Pedis: 160 [Left:Dorsalis Pedis: 158] Ankle: Posterior Tibial: [Left:Posterior Tibial: 0.95] [Right:0.94] Toe Nail Assessment Left: Right: Thick: No No Discolored: No No Deformed: No No Krasner, Von G. ([id number]) Improper Length and Hygiene: No No Electronic Signature(s) Signed: 10/07/2016 5:17:40 PM By: Regan Lemming BSN, RN Entered By: Regan Lemming on 10/07/2016 09:50:01 [name] ([id number]) -------------------------------------------------------------------------------- Multi Wound Chart Details Lee Bryant Date of Service: 10/07/2016 9:00 AM Patient Name: G. Patient Account Number: [id number] Medical Record Treating RN: Baruch Gouty, RN, BSN, [name] [id number] Number: Other Clinician: Date of Birth/Sex: [birth year] (60 y.o. 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] [2:the Lower Extremity] [N/A:N/A] Comorbid History: [1:Hypertension, Type II Diabetes] [2:Hypertension, Type II Diabetes] [N/A:N/A] Date Acquired: [1:08/17/2016] [2:08/17/2016] [N/A:N/A] Weeks of Treatment: [1:0] [2:0] [N/A:N/A] Wound Status: [1:Open] [2:Open] [N/A:N/A] Measurements L x W x D 3x1.5x0.4 [2:0.5x0.5x0.1] [N/A:N/A] (cm) Area (cm) : [1:3.534] [2:0.196] [N/A:N/A] Volume (cm) : [1:1.414] [2:0.02] [N/A:N/A] Classification: [1:Grade 1] [2:Grade 0] [N/A:N/A] Exudate Amount: [1:Medium] [2:Small] [N/A:N/A] Exudate Type: [1:Serosanguineous] [2:Serous] [N/A:N/A] Exudate Color: [1:red, brown] [2:amber] [N/A:N/A] Wound Margin: [1:Distinct, outline attached Distinct, outline attached] [N/A:N/A] Granulation Amount: [1:None Present (0%)] [2:Small (1-33%)] [N/A:N/A] Granulation Quality: [1:N/A] [2:Pink, Pale] [N/A:N/A] Necrotic Amount: [1:Large (67-100%)] [2:Medium (34-66%)] [N/A:N/A] Exposed Structures: [1:Fascia: No  Fat: No Tendon: No Muscle: No Joint: No Bone: No] [2:Fascia: No Fat: No Tendon: No Muscle: No Joint: No Bone: No] [N/A:N/A] Limited to Skin Limited to Skin Breakdown Breakdown Epithelialization: None Medium (34-66%) N/A Debridement: Debridement ([id number]- N/A N/A 11047) Pre-procedure 10:10 N/A N/A Verification/Time Out Taken: Pain Control: Lidocaine 4% Topical N/A N/A Solution Tissue Debrided: Fibrin/Slough, Exudates, N/A N/A Subcutaneous Level: Skin/Subcutaneous N/A N/A Tissue Debridement Area (sq 4.5 N/A N/A cm): Instrument: Curette N/A  N/A Bleeding: Minimum N/A N/A Hemostasis Achieved: Pressure N/A N/A Procedural Pain: 0 N/A N/A Post Procedural Pain: 0 N/A N/A Debridement Treatment Procedure was tolerated N/A N/A Response: well Post Debridement 3x1.5x0.4 N/A N/A Measurements L x W x D (cm) Post Debridement 1.414 N/A N/A Volume: (cm) Periwound Skin Texture: Edema: Yes Edema: Yes N/A Excoriation: No Excoriation: No Induration: No Induration: No Callus: No Callus: No Crepitus: No Crepitus: No Fluctuance: No Fluctuance: No Friable: No Friable: No Rash: No Rash: No Scarring: No Scarring: No Periwound Skin Moist: Yes Moist: Yes N/A Moisture: Maceration: No Maceration: No Dry/Scaly: No Dry/Scaly: No Periwound Skin Color: Mottled: Yes Mottled: Yes N/A Atrophie Blanche: No Atrophie Blanche: No Cyanosis: No Cyanosis: No Ecchymosis: No Ecchymosis: No Erythema: No Erythema: No Hemosiderin Staining: No Hemosiderin Staining: No Pallor: No Pallor: No Rubor: No Rubor: No Temperature: No Abnormality No Abnormality N/A Yes Yes N/A Bruneau, Ebrima G. ([id number]) Tenderness on Palpation: Wound Preparation: Ulcer Cleansing: Ulcer Cleansing: N/A Rinsed/Irrigated with Rinsed/Irrigated with Saline Saline Topical Anesthetic Topical Anesthetic Applied: Other: Lidocaine Applied: Other: Lidocaine 4% 4% Procedures Performed: Debridement N/A N/A Treatment Notes Wound #1 (Right, Anterior Lower Leg) 1. Cleansed with: Clean wound with Normal Saline 4. Dressing Applied: Santyl Ointment 5. Secondary Dressing Applied ABD Pad 7. Secured with 3 Layer Compression System - Right Lower Extremity Wound #2 (Right, Medial Malleolus) 1. Cleansed with: Clean wound with Normal Saline 4. Dressing Applied: Santyl Ointment 5. Secondary Dressing Applied ABD Pad 7. Secured with 3 Layer Compression System - Right Lower Extremity Electronic Signature(s) Signed: 10/07/2016 5:40:41 PM By: Linton Ham MD Entered  By: Linton Ham on 10/07/2016 11:06:18 [name] ([id number]) -------------------------------------------------------------------------------- [place] Details Lee Bryant Date of Service: 10/07/2016 9:00 AM Patient Name: G. Patient Account Number: [id number] Medical Record Treating RN: Baruch Gouty, RN, BSN, [name] [id number] Number: Other Clinician: Date of Birth/Sex: 28-Sep-1956 (60 y.o. Male) Treating Linton Ham Primary Care Physician: Tracie Harrier Physician/Extender: G Referring Physician: Metta Clines in Treatment: 0 Active Inactive Orientation to the Wound Care Program Nursing Diagnoses: Knowledge deficit related to the wound healing center program Goals: Patient/caregiver will verbalize understanding of the [place] Program Date Initiated: 10/07/2016 Goal Status: Active Interventions: Provide education on orientation to the wound center Notes: Wound/Skin Impairment Nursing Diagnoses: Impaired tissue integrity Knowledge deficit related to ulceration/compromised skin integrity Goals: Patient/caregiver will verbalize understanding of skin care regimen Date Initiated: 10/07/2016 Goal Status: Active Ulcer/skin breakdown will have a volume reduction of 30% by week 4 Date Initiated: 10/07/2016 Goal Status: Active Ulcer/skin breakdown will have a volume reduction of 50% by week 8 Date Initiated: 10/07/2016 Goal Status: Active Ulcer/skin breakdown will have a volume reduction of 80% by week 12 Date Initiated: 10/07/2016 Goal Status: Active Ulcer/skin breakdown will heal within 14 weeks XZAVIER, SWINGER ([id number]) Date Initiated: 10/07/2016 Goal Status: Active Interventions: Assess patient/caregiver ability to obtain necessary supplies Assess patient/caregiver ability to perform ulcer/skin care regimen upon admission and as needed Assess ulceration(s) every visit Provide education on ulcer and skin  care Treatment Activities: Referred to DME Anh Bigos for dressing supplies : 10/07/2016 Skin care regimen initiated : 10/07/2016 Topical wound management initiated : 10/07/2016 Notes: Electronic Signature(s) Signed: 10/07/2016 5:17:40 PM By: Regan Lemming BSN, RN Entered By: Regan Lemming on 10/07/2016 10:05:20 [name] ([id number]) -------------------------------------------------------------------------------- Pain Assessment Details Lee Bryant Date of Service: 10/07/2016 9:00 AM Patient Name: G. Patient Account Number: [id number] Medical Record Treating RN: Baruch Gouty, RN, BSN, [name] [id number] Number: Other Clinician: Date of Birth/Sex: [birth year] (60 y.o. Male) Treating ROBSON, MICHAEL Primary Care Physician: Tracie Harrier Physician/Extender: G Referring Physician: Metta Clines in Treatment: 0 Active Problems Location of Pain Severity and Description of Pain Patient Has Paino No Site Locations With Dressing Change: No Pain Management and Medication Current Pain Management: Electronic Signature(s) Signed: 10/07/2016 5:17:40 PM By: Regan Lemming BSN, RN Entered By: Regan Lemming on 10/07/2016 09:20:02 [name] ([id number]) -------------------------------------------------------------------------------- Patient/Caregiver Education Details Lee Bryant Date of Service: 10/07/2016 9:00 AM Patient Name: G. Patient Account Number: [id number] Medical Record Treating RN: Baruch Gouty, RN, BSN, [name] [id number] Number: Other Clinician: Date of Birth/Gender: Oct 29, 1955 (60 y.o. Male) Treating ROBSON, [place] Primary Care Physician: Tracie Harrier Physician/Extender: G Referring Physician: Metta Clines in Treatment: 0 Education Assessment Education Provided To: Patient Education Topics Provided Basic Hygiene: Methods: Explain/Verbal Responses: State content correctly Welcome To [facility] [place]: Methods: Explain/Verbal Responses:  State content correctly Wound/Skin Impairment: Methods: Explain/Verbal Responses: State content correctly Electronic Signature(s) Signed: 10/07/2016 5:17:40 PM By: Regan Lemming BSN, RN Entered By: Regan Lemming on 10/07/2016 10:37:03 [name] ([id number]) -------------------------------------------------------------------------------- Wound Assessment Details Lee Bryant Date of Service: 10/07/2016 9:00 AM Patient Name: G. Patient Account Number: [id number] Medical Record Treating RN: Baruch Gouty, RN, BSN, [name] [id number] Number: Other Clinician: Date of Birth/Sex: 1956/02/05 (60 y.o. Male) Treating ROBSON, [place] Primary Care Physician: Tracie Harrier Physician/Extender: G Referring Physician: Tracie Harrier Weeks in Treatment: 0 Wound Status Wound Number: 1 Primary Diabetic Wound/Ulcer of the Lower Etiology: Extremity Wound Location: Right Lower Leg - Anterior Wound Status: Open Wounding Event: Gradually Appeared Comorbid Hypertension, Type II Diabetes Date Acquired: 08/17/2016 History: Weeks Of Treatment: 0 Clustered Wound: No Photos Photo Uploaded By: Regan Lemming on 10/07/2016 16:43:44 Wound Measurements Length: (cm) 3 Width: (cm) 1.5 Depth: (cm) 0.4 Area: (cm) 3.534 Volume: (cm) 1.414 % Reduction in Area: % Reduction in Volume: Epithelialization: None Tunneling: No Undermining: No Wound Description Classification: Grade 1 Wound Margin: Distinct, outline attached Exudate Amount: Medium Cassell, Demontray G. ([id number]) Foul Odor After Cleansing: No Exudate Type: Serosanguineous Exudate Color: red, brown Wound Bed Granulation Amount: None Present (0%) Exposed Structure Necrotic Amount: Large (67-100%) Fascia Exposed: No Necrotic Quality: Adherent Slough Fat Layer Exposed: No Tendon Exposed: No Muscle Exposed: No Joint Exposed: No Bone Exposed: No Limited to Skin Breakdown Periwound Skin Texture Texture Color No Abnormalities Noted:  No No Abnormalities Noted: No Callus: No Atrophie Blanche: No Crepitus: No Cyanosis: No Excoriation: No Ecchymosis: No Fluctuance: No Erythema: No Friable: No Hemosiderin Staining: No Induration: No Mottled: Yes Localized Edema: Yes Pallor: No Rash: No Rubor: No Scarring: No Temperature / Pain Moisture Temperature: No Abnormality No Abnormalities Noted: No Tenderness on Palpation: Yes Dry / Scaly: No Maceration: No Moist: Yes Wound Preparation Ulcer Cleansing: Rinsed/Irrigated with Saline Topical Anesthetic Applied: Other: Lidocaine 4%, Treatment Notes Wound #1 (Right, Anterior Lower Leg) 1. Cleansed with: Clean wound with Normal Saline 4. Dressing Applied: Santyl Ointment 5. Secondary Dressing Applied ABD Pad 7. Secured with 3 Layer Compression System - Right Lower  Extremity Electronic Signature(s) FESTUS, PURSEL ([id number]) Signed: 10/07/2016 5:17:40 PM By: Regan Lemming BSN, RN Entered By: Regan Lemming on 10/07/2016 09:34:34 [name] ([id number]) -------------------------------------------------------------------------------- Wound Assessment Details Lee Bryant Date of Service: 10/07/2016 9:00 AM Patient Name: G. Patient Account Number: [id number] Medical Record Treating RN: Baruch Gouty, RN, BSN, [name] [id number] Number: Other Clinician: Date of Birth/Sex: 03-15-1956 (60 y.o. Male) Treating ROBSON, [place] Primary Care Physician: Tracie Harrier Physician/Extender: G Referring Physician: Tracie Harrier Weeks in Treatment: 0 Wound Status Wound Number: 2 Primary Diabetic Wound/Ulcer of the Lower Etiology: Extremity Wound Location: Right Malleolus - Medial Wound Status: Open Wounding Event: Gradually Appeared Comorbid Hypertension, Type II Diabetes Date Acquired: 08/17/2016 History: Weeks Of Treatment: 0 Clustered Wound: No Photos Photo Uploaded By: Regan Lemming on 10/07/2016 16:44:29 Wound Measurements Length: (cm) 0.5 Width: (cm)  0.5 Depth: (cm) 0.1 Area: (cm) 0.196 Volume: (cm) 0.02 % Reduction in Area: % Reduction in Volume: Epithelialization: Medium (34-66%) Tunneling: No Undermining: No Wound Description Classification: Grade 0 Wound Margin: Distinct, outline attached Exudate Amount: Small Storlie, Deangelo G. ([id number]) Foul Odor After Cleansing: No Exudate Type: Serous Exudate Color: amber Wound Bed Granulation Amount: Small (1-33%) Exposed Structure Granulation Quality: Pink, Pale Fascia Exposed: No Necrotic Amount: Medium (34-66%) Fat Layer Exposed: No Necrotic Quality: Adherent Slough Tendon Exposed: No Muscle Exposed: No Joint Exposed: No Bone Exposed: No Limited to Skin Breakdown Periwound Skin Texture Texture Color No Abnormalities Noted: No No Abnormalities Noted: No Callus: No Atrophie Blanche: No Crepitus: No Cyanosis: No Excoriation: No Ecchymosis: No Fluctuance: No Erythema: No Friable: No Hemosiderin Staining: No Induration: No Mottled: Yes Localized Edema: Yes Pallor: No Rash: No Rubor: No Scarring: No Temperature / Pain Moisture Temperature: No Abnormality No Abnormalities Noted: No Tenderness on Palpation: Yes Dry / Scaly: No Maceration: No Moist: Yes Wound Preparation Ulcer Cleansing: Rinsed/Irrigated with Saline Topical Anesthetic Applied: Other: Lidocaine 4%, Treatment Notes Wound #2 (Right, Medial Malleolus) 1. Cleansed with: Clean wound with Normal Saline 4. Dressing Applied: Santyl Ointment 5. Secondary Dressing Applied ABD Pad 7. Secured with 3 Layer Compression System - Right Lower Extremity Electronic Signature(s) CHIP, CANEPA ([id number]) Signed: 10/07/2016 5:17:40 PM By: Regan Lemming BSN, RN Entered By: Regan Lemming on 10/07/2016 09:37:00 [name] ([id number]) -------------------------------------------------------------------------------- Vitals Details Lee Bryant Date of Service: 10/07/2016 9:00 AM Patient  Name: G. Patient Account Number: [id number] Medical Record Treating RN: Baruch Gouty, RN, BSN, [name] [id number] Number: Other Clinician: Date of Birth/Sex: Jun 23, 1956 (60 y.o. Male) Treating ROBSON, [place] Primary Care Physician: Tracie Harrier Physician/Extender: G Referring Physician: Tracie Harrier Weeks in Treatment: 0 Vital Signs Time Taken: 09:20 Temperature (F): 97.6 Source: Stated Pulse (bpm): 103 Weight (lbs): 273 Respiratory Rate (breaths/min): 20 Source: Stated Blood Pressure (mmHg): 167/86 Reference Range: 80 - 120 mg / dl Electronic Signature(s) Signed: 10/07/2016 5:17:40 PM By: Regan Lemming BSN, RN Entered By: Regan Lemming on 10/07/2016 09:50:48

## 2016-10-14 ENCOUNTER — Encounter: Payer: BLUE CROSS/BLUE SHIELD | Admitting: Internal Medicine

## 2016-10-14 DIAGNOSIS — E11622 Type 2 diabetes mellitus with other skin ulcer: Secondary | ICD-10-CM | POA: Diagnosis not present

## 2016-10-15 ENCOUNTER — Other Ambulatory Visit: Payer: Self-pay | Admitting: Internal Medicine

## 2016-10-15 DIAGNOSIS — S81809A Unspecified open wound, unspecified lower leg, initial encounter: Secondary | ICD-10-CM

## 2016-10-15 NOTE — Progress Notes (Signed)
RIOT, WATERWORTH ([id number]) Visit Report for 10/14/2016 Chief Complaint Document Details CLEBURN, MAIOLO Date of Service: 10/14/2016 10:15 AM Patient Name: G. Patient Account Number: [id number] Medical Record Treating RN: Cornell Barman [id number] Number: Other Clinician: Date of Birth/Sex: August 01, 1956 (60 y.o. Male) Treating Linton Ham Primary Care Physician/Extender: Katherina Right, Vishwanath Physician: Referring Physician: Metta Clines in Treatment: 1 Information Obtained from: Patient Chief Complaint 10/07/16; the patient is here for review of 2 wounds on the right anterior lower leg and the right medial ankle. Electronic Signature(s) Signed: 10/14/2016 5:22:07 PM By: Linton Ham MD Entered By: Linton Ham on 10/14/2016 11:36:41 [name] ([id number]) -------------------------------------------------------------------------------- Debridement Details Lee Bryant Date of Service: 10/14/2016 10:15 AM Patient Name: G. Patient Account Number: [id number] Medical Record Treating RN: Cornell Barman [id number] Number: Other Clinician: Date of Birth/Sex: January 11, 1956 (60 y.o. Male) Treating Raizel Wesolowski Primary Care Physician/Extender: Katherina Right, Vishwanath Physician: Referring Physician: Metta Clines in Treatment: 1 Debridement Performed for Wound #1 Right,Anterior Lower Leg Assessment: Performed By: Physician Ricard Dillon, MD Debridement: Debridement Pre-procedure Yes - 11:13 Verification/Time Out Taken: Start Time: 11:14 Pain Control: Other : lidocaine 4% Level: Skin/Subcutaneous Tissue Total Area Debrided (L x 2.6 (cm) x 1.2 (cm) = 3.12 (cm) W): Tissue and other Viable, Fibrin/Slough, Subcutaneous material debrided: Instrument: Curette Bleeding: Minimum Hemostasis Achieved: Pressure End Time: 11:17 Procedural Pain: 5 Post Procedural Pain: 3 Response to Treatment: Procedure was tolerated well Post Debridement  Measurements of Total Wound Length: (cm) 2.6 Width: (cm) 1.2 Depth: (cm) 0.3 Volume: (cm) 0.735 Character of Wound/Ulcer Post Requires Further Debridement Debridement: Severity of Tissue Post Debridement: Fat layer exposed Post Procedure Diagnosis Same as Pre-procedure Electronic Signature(s) Signed: 10/14/2016 5:22:07 PM By: Linton Ham MD [name] ([id number]) Signed: 10/14/2016 6:06:27 PM By: Gretta Cool, RN, BSN, Kim RN, BSN Entered By: Gretta Cool, RN, BSN, Kim on 10/14/2016 11:15:54 [name] ([id number]) -------------------------------------------------------------------------------- HPI Details Lee Bryant Date of Service: 10/14/2016 10:15 AM Patient Name: G. Patient Account Number: [id number] Medical Record Treating RN: Cornell Barman [id number] Number: Other Clinician: Date of Birth/Sex: February 01, 1956 (60 y.o. Male) Treating Linton Ham Primary Care Physician/Extender: Katherina Right, Vishwanath Physician: Referring Physician: Metta Clines in Treatment: 1 History of Present Illness HPI Description: 10/07/16; patient is a 60 year old male type II diabetic on oral agents. He was apparently cleaning out his pond and traumatized his right anterior leg on since late roughly 3-4 months ago. 3 days later he noted an open area on the right anterior leg. Exact course of this is not completely clear or resolve his primary physician on 12/15. A culture was done and according to the patient a culture was done and Cipro was prescribed at 500 mg twice a day. Do not have the exact culture results. Also his primary physician arranged arterial studies for after the holidays which seems reasonable. Within the last week or 2 secondary wound has come up on the right medial malleoli area. Again the cause of this is not completely clear he does not complain of trauma. ABIs in this clinic was 0.9 bilaterally. He does not give a history of claudication. He has a history of  a traumatic wound on the left anterior leg many years ago which healed. For the Cipro he appears to bring given a course of clindamycin. Recently his has been treating this with an ointment called Terrasil which we googled to find it is a combination of multiple products including silver, zinc oxide. They claim that that is done a  lot for helping improve this wound. In reviewing his primary 73 notes occur [facility] from 12/15 he is listed as having type 2 diabetes without complication, essential hypertension, hypertriglyceridemia and neuralgia/neuritis unspecified 10/14/16; patient has 3 reasonably small punched out wounds on the right leg. 2 on the anterior part of the calf and one on the medial right ankle. He had told me that his primary physician have arranged for arterial studies last week. As it turns out I think these were studies that were going to be done in his clinic. The patient would not allow him to take the dressing off. I would rather formal arterial studies in this setting in any case. ABIs in our clinic were 0.9 bilaterally. I would like to see arterial Dopplers as well as ABIs Electronic Signature(s) Signed: 10/14/2016 5:22:07 PM By: Linton Ham MD Entered By: Linton Ham on 10/14/2016 11:38:02 [name] ([id number]) -------------------------------------------------------------------------------- Physical Exam Details Lee Bryant Date of Service: 10/14/2016 10:15 AM Patient Name: G. Patient Account Number: [id number] Medical Record Treating RN: Cornell Barman [id number] Number: Other Clinician: Date of Birth/Sex: 1956/03/01 (60 y.o. Male) Treating Kashlyn Salinas Primary Care Physician/Extender: Katherina Right, Vishwanath Physician: Referring Physician: Tracie Harrier Weeks in Treatment: 1 Constitutional Patient is hypertensive.. Pulse regular and within target range for patient.Marland Kitchen Respirations regular, non-labored and within target range..  Temperature is normal and within the target range for the patient.. Patient's appearance is neat and clean. Appears in no acute distress. Well nourished and well developed.Marland Kitchen Respiratory Respiratory effort is easy and symmetric bilaterally. Rate is normal at rest and on room air.. Cardiovascular Dorsalis pedis pulses are palpable. Notes Wound exam; the patient has 2 small wounds on the right anterior leg. The larger one is inferior. He also has an area on his right medial ankle just below the medial malleolus. All of these are covered in surface slough and require debridement with a #3 curet. There is no problem with bleeding here however the complains of a lot of pain. Electronic Signature(s) Signed: 10/14/2016 5:22:07 PM By: Linton Ham MD Entered By: Linton Ham on 10/14/2016 11:41:33 [name] ([id number]) -------------------------------------------------------------------------------- Physician Orders Details Lee Bryant Date of Service: 10/14/2016 10:15 AM Patient Name: G. Patient Account Number: [id number] Medical Record Treating RN: Cornell Barman [id number] Number: Other Clinician: Date of Birth/Sex: 07-28-1956 (60 y.o. Male) Treating Lanai Conlee Primary Care Physician/Extender: Katherina Right, Vishwanath Physician: Referring Physician: Metta Clines in Treatment: 1 Verbal / Phone Orders: Yes Clinician: Cornell Barman Read Back and Verified: Yes Diagnosis Coding Wound Cleansing Wound #1 Right,Anterior Lower Leg o Cleanse wound with mild soap and water o May Shower, gently pat wound dry prior to applying new dressing. Wound #2 Right,Medial Malleolus o Cleanse wound with mild soap and water o May Shower, gently pat wound dry prior to applying new dressing. Anesthetic Wound #1 Right,Anterior Lower Leg o Topical Lidocaine 4% cream applied to wound bed prior to debridement Wound #2 Right,Medial Malleolus o Topical Lidocaine 4% cream  applied to wound bed prior to debridement Primary Wound Dressing Wound #1 Right,Anterior Lower Leg o Santyl Ointment Wound #2 Right,Medial Malleolus o Santyl Ointment Secondary Dressing Wound #1 Right,Anterior Lower Leg o Dry Gauze - 2x2 Wound #2 Right,Medial Malleolus o Dry Gauze - 2x2 Dressing Change Frequency Wound #1 Right,Anterior Lower Leg o Change dressing every week Breunig, Tosh G. ([id number]) Wound #2 Right,Medial Malleolus o Change dressing every week Follow-up Appointments Wound #1 Right,Anterior Lower Leg o Return Appointment in 1 week. Wound #2  Right,Medial Malleolus o Return Appointment in 1 week. Edema Control Wound #1 Right,Anterior Lower Leg o 3 Layer Compression System - Right Lower Extremity o Patient to wear own Juxtalite/Juzo compression garment. - order Wound #2 Right,Medial Malleolus o 3 Layer Compression System - Right Lower Extremity o Patient to wear own Juxtalite/Juzo compression garment. - order Additional Orders / Instructions Wound #1 Right,Anterior Lower Leg o Increase protein intake. o Activity as tolerated Wound #2 Right,Medial Malleolus o Increase protein intake. o Activity as tolerated Services and Therapies o Arterial Studies- Unilateral - Right Electronic Signature(s) Signed: 10/14/2016 5:22:07 PM By: Linton Ham MD Signed: 10/14/2016 6:06:27 PM By: Gretta Cool RN, BSN, Kim RN, BSN Entered By: Gretta Cool, RN, BSN, Kim on 10/14/2016 11:28:09 [name] ([id number]) -------------------------------------------------------------------------------- Problem List Details Lee Bryant Date of Service: 10/14/2016 10:15 AM Patient Name: G. Patient Account Number: [id number] Medical Record Treating RN: Cornell Barman [id number] Number: Other Clinician: Date of Birth/Sex: 04-May-1956 (60 y.o. 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 Patient Account Number: [id number] Medical Record Treating RN: Cornell Barman [id number] Number: Other Clinician: Date of Birth/Sex: 1956/01/20 (60 y.o. Male) Treating Linton Ham Primary Care Physician/Extender: Katherina Right, Vishwanath Physician: Referring Physician: Metta Clines in Treatment: 1 Subjective Chief Complaint Information obtained from Patient 10/07/16; the patient is here for review of 2 wounds on the right anterior lower leg and the right medial ankle. History of Present Illness (HPI) 10/07/16; patient is a 60 year old male type II diabetic on oral agents. He was apparently cleaning out his pond and traumatized his right anterior leg on since late roughly 3-4 months ago. 3 days later he noted an open area on the right anterior leg. Exact course of this is not completely clear or resolve his primary physician on 12/15. A culture was done and according to the patient a culture was done and Cipro was prescribed at 500 mg twice a day. Do not have the exact culture results. Also his primary physician arranged arterial  studies for after the holidays which seems reasonable. Within the last week or 2 secondary wound has come up on the right medial malleoli area. Again the cause of this is not completely clear he does not complain of trauma. ABIs in this clinic was 0.9 bilaterally. He does not give a history of claudication. He has a history of a traumatic wound on the left anterior leg many years ago which healed. For the Cipro he appears to bring given a course of clindamycin. Recently his has been treating this with an ointment called Terrasil which we googled to find it is a combination of multiple products including silver, zinc oxide. They claim that that is done a lot for helping improve this wound. In reviewing his primary 62 notes occur [facility] from 12/15 he is listed as having type 2 diabetes without complication,  essential hypertension, hypertriglyceridemia and neuralgia/neuritis unspecified 10/14/16; patient has 3 reasonably small punched out wounds on the right leg. 2 on the anterior part of the calf and one on the medial right ankle. He had told me that his primary physician have arranged for arterial studies last week. As it turns out I think these were studies that were going to be done in his clinic. The patient would not allow him to take the dressing off. I would rather formal arterial studies in this setting in any case. ABIs in our clinic were 0.9 bilaterally. I would like to see arterial Dopplers as well as ABIs Objective Laroche, Erma G. ([id number]) Constitutional Patient is hypertensive.. Pulse regular and within target range for patient.Marland Kitchen Respirations regular, non-labored and within target range.. Temperature is normal and within the target range for the patient.. Patient's appearance is neat and clean. Appears in no acute distress. Well nourished and well developed.. Vitals Time Taken: 10:54 AM, Height: 73 in, Source: Stated, Weight: 273 lbs, Source: Measured, BMI:  36, Temperature: 98.2 F, Pulse: 95 bpm, Respiratory Rate: 16 breaths/min, Blood Pressure: 164/91 mmHg. Respiratory Respiratory effort is easy and symmetric bilaterally. Rate is normal at rest and on room air.. Cardiovascular Dorsalis pedis pulses are palpable. General Notes: Wound exam; the patient has 2 small wounds on the right anterior leg. The larger one is inferior. He also has an area on his right medial ankle just below the medial malleolus. All of these are covered in surface slough and require debridement with a #3 curet. There is no problem with bleeding here however the complains of a lot of pain. Integumentary (Hair, Skin) Wound #1 status is Open. Original cause of wound was Gradually Appeared. The wound is located on the Right,Anterior Lower Leg. The wound measures 2.6cm length x 1.2cm width x 0.3cm depth; 2.45cm^2 area and 0.735cm^3 volume. There is fat exposed. There is no tunneling or undermining noted. There is a medium amount of serosanguineous drainage noted. The wound margin is distinct with the outline attached to the wound base. There is no granulation within the wound bed. There is a large (67-100%) amount of necrotic tissue within the wound bed including Adherent Slough. The periwound skin appearance exhibited: Localized Edema, Scarring, Moist, Mottled. The periwound skin appearance did not exhibit: Callus, Crepitus, Excoriation, Fluctuance, Friable, Induration, Rash, Dry/Scaly, Maceration, Atrophie Blanche, Cyanosis, Ecchymosis, Hemosiderin Staining, Pallor, Rubor, Erythema. Periwound temperature was noted as No Abnormality. The periwound has tenderness on palpation. Wound #2 status is Open. Original cause of wound was Gradually Appeared. The wound is located on the Right,Medial Malleolus. The wound measures 0.3cm length x 0.5cm width x 0.1cm depth; 0.118cm^2 area and 0.012cm^3 volume. The wound is limited to skin breakdown. There is no tunneling or  undermining noted. There is a none present amount of drainage noted. The wound margin is distinct with the outline attached to the wound base. There is no granulation within the wound bed. There is a large (67-100%) amount of necrotic tissue within the wound bed including Eschar. The periwound skin appearance exhibited: Localized Edema, Moist, Mottled. The periwound skin appearance did not exhibit: Callus, Crepitus, Excoriation, Fluctuance, Friable, Induration, Rash, Scarring, Dry/Scaly, Maceration, Atrophie Blanche, Cyanosis, Ecchymosis, Hemosiderin Staining, Pallor, Rubor, Erythema. Periwound temperature was noted as No Abnormality. The periwound has tenderness on palpation. 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 There was a Skin/Subcutaneous Tissue Debridement ([id number]) debridement with total area of 3.12 sq cm performed by Ricard Dillon, MD. with the following instrument(s): Curette to remove Viable tissue/material including Fibrin/Slough and Subcutaneous after achieving pain control using Other (lidocaine 4%). A time out was conducted at 11:13, prior to the start of the procedure. A Minimum amount of bleeding was controlled with Pressure. The procedure was tolerated well with a pain level of 5 throughout and a pain level of 3 following the procedure. Post Debridement Measurements: 2.6cm length x 1.2cm width x 0.3cm depth; 0.735cm^3 volume. Character of Wound/Ulcer Post Debridement requires further debridement. Severity of Tissue  Post Debridement is: Fat layer exposed. Post procedure Diagnosis Wound #1: Same as Pre-Procedure Plan Wound Cleansing: Wound #1 Right,Anterior Lower Leg: Cleanse wound with mild soap and water May Shower, gently pat wound dry prior to applying new dressing. Wound #2 Right,Medial Malleolus: Cleanse wound with mild soap and water May Shower, gently pat wound dry prior to applying new dressing. Anesthetic: Wound #1 Right,Anterior Lower Leg: Topical Lidocaine 4% cream applied to wound bed prior to debridement Wound #2 Right,Medial Malleolus: Topical Lidocaine 4% cream applied to wound bed prior to debridement Primary Wound Dressing: Wound #1 Right,Anterior Lower Leg: Santyl Ointment ASLAN, HIMES. ([id number]) Wound #2 Right,Medial Malleolus: Santyl Ointment Secondary Dressing: Wound #1 Right,Anterior Lower Leg: Dry Gauze - 2x2 Wound #2 Right,Medial Malleolus: Dry Gauze - 2x2 Dressing Change Frequency: Wound #1 Right,Anterior Lower Leg: Change dressing every week Wound #2 Right,Medial Malleolus: Change dressing every week Follow-up Appointments: Wound #1 Right,Anterior Lower Leg: Return Appointment in 1 week. Wound #2 Right,Medial Malleolus: Return Appointment in 1 week. Edema Control: Wound #1 Right,Anterior Lower Leg: 3 Layer Compression System - Right Lower Extremity Patient to wear own Juxtalite/Juzo compression garment. - order Wound #2 Right,Medial Malleolus: 3 Layer Compression System - Right Lower Extremity Patient to wear own Juxtalite/Juzo compression garment. - order Additional Orders / Instructions: Wound #1 Right,Anterior Lower Leg: Increase protein intake. Activity as tolerated Wound #2 Right,Medial Malleolus: Increase protein intake. Activity as tolerated Services and Therapies ordered were: Arterial Studies- Unilateral - Right His right leg wounds will be continued to be dressed with Santyl I would like formal arterial studies including arterial  Dopplers. Although these wounds were initially trauma they are small punched-out areas that are not healing. Electronic Signature(s) Signed: 10/14/2016 5:22:07 PM By: Linton Ham MD Entered By: Linton Ham on 10/14/2016 11:45:06 [name] ([id number]) [name] ([id number]) -------------------------------------------------------------------------------- SuperBill Details Lee Bryant Date of Service: 10/14/2016 Patient Name: G. Patient Account Number: [id number] Medical Record Treating RN: Cornell Barman [id number] Number: Other Clinician: Date of Birth/Sex: [birth year] (60 y.o. 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 Chronic venous hypertension (idiopathic) with ulcer of right lower extremity Facility Procedures CPT4 Code Description: [id number] - DEB SUBQ TISSUE 20 SQ CM/< ICD-10 Description Diagnosis L97.211 Non-pressure chronic ulcer of right calf limited to L97.311 Non-pressure chronic  ulcer of right ankle limited to E11.622 Type 2 diabetes mellitus with  other skin ulcer Modifier: breakdown of breakdown o Quantity: 1 skin f skin Physician Procedures CPT4 Code Description: [id number] [id number] - WC PHYS SUBQ TISS 20 SQ CM ICD-10 Description Diagnosis L97.211 Non-pressure chronic ulcer of right calf limited to 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

## 2016-10-15 NOTE — Progress Notes (Signed)
Lee, Bryant ([id number]) Visit Report for 10/14/2016 Arrival Information Details Lee, Bryant Date of Service: 10/14/2016 10:15 AM Patient Name: G. Patient Account Number: [id number] Medical Record Treating RN: Cornell Barman [id number] Number: Other Clinician: Date of Birth/Sex: 1955-12-19 (60 y.o. Male) Treating Linton Ham Primary Care Physician: Tracie Harrier Physician/Extender: G Referring Physician: Metta Clines in Treatment: 1 Visit Information History Since Last Visit Added or deleted any medications: No Patient Arrived: Ambulatory Any new allergies or adverse reactions: No Arrival Time: 10:53 Had a fall or experienced change in No Accompanied By: wife activities of daily living that may affect Transfer Assistance: None risk of falls: Patient Identification Verified: Yes Signs or symptoms of abuse/neglect since last No Secondary Verification Process Yes visito Completed: Hospitalized since last visit: No Patient Requires Transmission- No Has Dressing in Place as Prescribed: Yes Based Precautions: Pain Present Now: No Patient Has Alerts: Yes Patient Alerts: Patient on Blood Thinner Type II Diabetic 81mg  Aspirin Electronic Signature(s) Signed: 10/14/2016 6:06:27 PM By: Gretta Cool, RN, BSN, Kim RN, BSN Entered By: Gretta Cool, RN, BSN, Kim on 10/14/2016 10:56:01 Lee Bryant ([id number]) -------------------------------------------------------------------------------- Encounter Discharge Information Details Lee Bryant Date of Service: 10/14/2016 10:15 AM Patient Name: G. Patient Account Number: [id number] Medical Record Treating RN: Cornell Barman [id number] Number: Other Clinician: Date of Birth/Sex: 1956-05-12 (61 y.o. Male) Treating Linton Ham Primary Care Physician: Tracie Harrier Physician/Extender: G Referring Physician: Metta Clines in Treatment: 1 Encounter Discharge Information Items Discharge Pain Level:  0 Discharge Condition: Stable Ambulatory Status: Ambulatory Discharge Destination: Home Transportation: Private Auto Accompanied By: wife Schedule Follow-up Appointment: Yes Medication Reconciliation completed and provided to Patient/Care Yes Lee Bryant: Provided on Clinical Summary of Care: 10/14/2016 Form Type Recipient Paper Patient EM Electronic Signature(s) Signed: 10/14/2016 6:06:27 PM By: Gretta Cool, RN, BSN, Kim RN, BSN Previous Signature: 10/14/2016 11:27:31 AM Version By: Ruthine Dose Entered By: Gretta Cool RN, BSN, Kim on 10/14/2016 11:31:42 Lee Bryant ([id number]) -------------------------------------------------------------------------------- Lower Extremity Assessment Details Lee Bryant Date of Service: 10/14/2016 10:15 AM Patient Name: G. Patient Account Number: [id number] Medical Record Treating RN: Cornell Barman [id number] Number: Other Clinician: Date of Birth/Sex: 02/25/1956 (60 y.o. Male) Treating Linton Ham Primary Care Physician: Tracie Harrier Physician/Extender: G Referring Physician: Tracie Harrier Weeks in Treatment: 1 Edema Assessment Assessed: [Left: No] [Right: No] E[Left: dema] [Right: :] Calf Left: Right: Point of Measurement: 37 cm From Medial Instep cm 41 cm Ankle Left: Right: Point of Measurement: 9 cm From Medial Instep cm 26.2 cm Vascular Assessment Claudication: Claudication Assessment [Right:None] Pulses: Dorsalis Pedis Palpable: [Right:Yes] Posterior Tibial Palpable: [Right:Yes] Extremity colors, hair growth, and conditions: Extremity Color: [Right:Hyperpigmented] Hair Growth on Extremity: [Right:No] Temperature of Extremity: [Right:Warm] Capillary Refill: [Right:< 3 seconds] Dependent Rubor: [Right:No] Blanched when Elevated: [Right:No] Lipodermatosclerosis: [Right:No] Toe Nail Assessment Left: Right: Thick: No Discolored: No Deformed: No Lee, Kharson Bryant Level ([id number]) Improper Length and Hygiene:  No Electronic Signature(s) Signed: 10/14/2016 6:06:27 PM By: Gretta Cool, RN, BSN, Kim RN, BSN Entered By: Gretta Cool, RN, BSN, Kim on 10/14/2016 11:04:31 Lee Bryant ([id number]) -------------------------------------------------------------------------------- Multi Wound Chart Details Lee Bryant Date of Service: 10/14/2016 10:15 AM Patient Name: G. Patient Account Number: [id number] Medical Record Treating RN: Cornell Barman [id number] Number: Other Clinician: Date of Birth/Sex: 06-26-1956 (60 y.o. Male) Treating Linton Ham Primary Care Physician: Tracie Harrier Physician/Extender: G Referring Physician: Tracie Harrier Weeks in Treatment: 1 Vital Signs Height(in): 73 Pulse(bpm): 95 Weight(lbs): 273 Blood Pressure 164/91 (mmHg): Body Mass Index(BMI): 36 Temperature(F): 98.2 Respiratory Rate 16 (breaths/min): Photos: [N/A:N/A] Wound Location:  Right Lower Leg - Anterior Right Malleolus - Medial N/A Wounding Event: Gradually Appeared Gradually Appeared N/A Primary Etiology: Diabetic Wound/Ulcer of Diabetic Wound/Ulcer of N/A the Lower Extremity the Lower Extremity Comorbid History: Hypertension, Type II Hypertension, Type II N/A Diabetes Diabetes Date Acquired: 08/17/2016 08/17/2016 N/A Weeks of Treatment: 1 1 N/A Wound Status: Open Open N/A Measurements L x W x D 2.6x1.2x0.3 0.3x0.5x0.1 N/A (cm) Area (cm) : 2.45 0.118 N/A Volume (cm) : 0.735 0.012 N/A % Reduction in Area: 30.70% 39.80% N/A % Reduction in Volume: 48.00% 40.00% N/A Classification: Grade 2 Grade 1 N/A Exudate Amount: Medium None Present N/A Exudate Type: Serosanguineous N/A N/A Exudate Color: red, brown N/A N/A Wound Margin: Distinct, outline attached Distinct, outline attached N/A Granulation Amount: None Present (0%) None Present (0%) N/A Necrotic Amount: Large (67-100%) Large (67-100%) N/A Lee Bryant, Lee G. ([id number]) Necrotic Tissue: Adherent Slough Eschar N/A Exposed  Structures: Fat: Yes Fascia: No N/A Fascia: No Fat: No Tendon: No Tendon: No Muscle: No Muscle: No Joint: No Joint: No Bone: No Bone: No Limited to Skin Breakdown Epithelialization: None None N/A Debridement: Debridement (14431- N/A N/A 11047) Pre-procedure 11:13 N/A N/A Verification/Time Out Taken: Pain Control: Other N/A N/A Tissue Debrided: Fibrin/Slough, N/A N/A Subcutaneous Level: Skin/Subcutaneous N/A N/A Tissue Debridement Area (sq 3.12 N/A N/A cm): Instrument: Curette N/A N/A Bleeding: Minimum N/A N/A Hemostasis Achieved: Pressure N/A N/A Procedural Pain: 5 N/A N/A Post Procedural Pain: 3 N/A N/A Debridement Treatment Procedure was tolerated N/A N/A Response: well Post Debridement 2.6x1.2x0.3 N/A N/A Measurements L x W x D (cm) Post Debridement 0.735 N/A N/A Volume: (cm) Periwound Skin Texture: Edema: Yes Edema: Yes N/A Scarring: Yes Excoriation: No Excoriation: No Induration: No Induration: No Callus: No Callus: No Crepitus: No Crepitus: No Fluctuance: No Fluctuance: No Friable: No Friable: No Rash: No Rash: No Scarring: No Periwound Skin Moist: Yes Moist: Yes N/A Moisture: Maceration: No Maceration: No Dry/Scaly: No Dry/Scaly: No Periwound Skin Color: Mottled: Yes Mottled: Yes N/A Atrophie Blanche: No Atrophie Blanche: No Cyanosis: No Cyanosis: No Ecchymosis: No Ecchymosis: No Lee Bryant, Lee G. ([id number]) Erythema: No Erythema: No Hemosiderin Staining: No Hemosiderin Staining: No Pallor: No Pallor: No Rubor: No Rubor: No Temperature: No Abnormality No Abnormality N/A Tenderness on Yes Yes N/A Palpation: Wound Preparation: Ulcer Cleansing: Ulcer Cleansing: N/A Rinsed/Irrigated with Rinsed/Irrigated with Saline Saline Topical Anesthetic Topical Anesthetic Applied: Other: Lidocaine Applied: Other: Lidocaine 4% 4% Procedures Performed: Debridement N/A N/A Treatment Notes Wound #1 (Right, Anterior Lower Leg) 1.  Cleansed with: Clean wound with Normal Saline 2. Anesthetic Topical Lidocaine 4% cream to wound bed prior to debridement 4. Dressing Applied: Santyl Ointment 5. Secondary Dressing Applied Dry Gauze 7. Secured with 3 Layer Compression System - Right Lower Extremity Wound #2 (Right, Medial Malleolus) 1. Cleansed with: Clean wound with Normal Saline 2. Anesthetic Topical Lidocaine 4% cream to wound bed prior to debridement 4. Dressing Applied: Santyl Ointment 5. Secondary Dressing Applied Dry Gauze 7. Secured with 3 Layer Compression System - Right Lower Extremity Electronic Signature(s) Signed: 10/14/2016 5:22:07 PM By: Linton Ham MD Entered By: Linton Ham on 10/14/2016 11:36:16 Lee Bryant ([id number]) -------------------------------------------------------------------------------- Multi-Disciplinary Care Plan Details Lee Bryant Date of Service: 10/14/2016 10:15 AM Patient Name: G. Patient Account Number: [id number] Medical Record Treating RN: Cornell Barman [id number] Number: Other Clinician: Date of Birth/Sex: 1956-05-22 (60 y.o. Male) Treating Linton Ham Primary Care Physician: Tracie Harrier Physician/Extender: G Referring Physician: Metta Clines in Treatment: 1 Active Inactive Orientation to the Wound Care  Program Nursing Diagnoses: Knowledge deficit related to the wound healing center program Goals: Patient/caregiver will verbalize understanding of the [facility] Program Date Initiated: 10/07/2016 Goal Status: Active Interventions: Provide education on orientation to the wound center Notes: Wound/Skin Impairment Nursing Diagnoses: Impaired tissue integrity Knowledge deficit related to ulceration/compromised skin integrity Goals: Patient/caregiver will verbalize understanding of skin care regimen Date Initiated: 10/07/2016 Goal Status: Active Ulcer/skin breakdown will have a volume reduction of 30% by week  4 Date Initiated: 10/07/2016 Goal Status: Active Ulcer/skin breakdown will have a volume reduction of 50% by week 8 Date Initiated: 10/07/2016 Goal Status: Active Ulcer/skin breakdown will have a volume reduction of 80% by week 12 Date Initiated: 10/07/2016 Goal Status: Active Ulcer/skin breakdown will heal within 14 weeks Lee Bryant, Lee Bryant ([id number]) Date Initiated: 10/07/2016 Goal Status: Active Interventions: Assess patient/caregiver ability to obtain necessary supplies Assess patient/caregiver ability to perform ulcer/skin care regimen upon admission and as needed Assess ulceration(s) every visit Provide education on ulcer and skin care Treatment Activities: Referred to DME Abdul [facility]irne for dressing supplies : 10/07/2016 Skin care regimen initiated : 10/07/2016 Topical wound management initiated : 10/07/2016 Notes: Electronic Signature(s) Signed: 10/14/2016 6:06:27 PM By: Gretta Cool, RN, BSN, Kim RN, BSN Entered By: Gretta Cool, RN, BSN, Kim on 10/14/2016 11:09:30 Lee Bryant ([id number]) -------------------------------------------------------------------------------- Pain Assessment Details Lee Bryant Date of Service: 10/14/2016 10:15 AM Patient Name: G. Patient Account Number: [id number] Medical Record Treating RN: Cornell Barman [id number] Number: Other Clinician: Date of Birth/Sex: 11-06-1955 (60 y.o. Male) Treating Linton Ham Primary Care Physician: Tracie Harrier Physician/Extender: G Referring Physician: Tracie Harrier Weeks in Treatment: 1 Active Problems Location of Pain Severity and Description of Pain Patient Has Paino No Site Locations With Dressing Change: No Pain Management and Medication Current Pain Management: Electronic Signature(s) Signed: 10/14/2016 6:06:27 PM By: Gretta Cool, RN, BSN, Kim RN, BSN Entered By: Gretta Cool, RN, BSN, Kim on 10/14/2016 10:54:40 Lee Bryant  ([id number]) -------------------------------------------------------------------------------- Patient/Caregiver Education Details Lee Bryant Date of Service: 10/14/2016 10:15 AM Patient Name: G. Patient Account Number: [id number] Medical Record Treating RN: Cornell Barman [id number] Number: Other Clinician: Date of Birth/Gender: 1955-11-25 (60 y.o. Male) Treating Linton Ham Primary Care Physician: Tracie Harrier Physician/Extender: G Referring Physician: Metta Clines in Treatment: 1 Education Assessment Education Provided To: Patient Education Topics Provided Venous: Handouts: Controlling Swelling with Multilayered Compression Wraps Methods: Demonstration, Explain/Verbal Responses: State content correctly Electronic Signature(s) Signed: 10/14/2016 6:06:27 PM By: Gretta Cool, RN, BSN, Kim RN, BSN Entered By: Gretta Cool, RN, BSN, Kim on 10/14/2016 11:32:03 Lee Bryant ([id number]) -------------------------------------------------------------------------------- Wound Assessment Details Lee Bryant Date of Service: 10/14/2016 10:15 AM Patient Name: G. Patient Account Number: [id number] Medical Record Treating RN: Cornell Barman [id number] Number: Other Clinician: Date of Birth/Sex: 05-Oct-1956 (60 y.o. Male) Treating ROBSON, [place] Primary Care Physician: Tracie Harrier Physician/Extender: G Referring Physician: Tracie Harrier Weeks in Treatment: 1 Wound Status Wound Number: 1 Primary Diabetic Wound/Ulcer of the Lower Etiology: Extremity Wound Location: Right Lower Leg - Anterior Wound Status: Open Wounding Event: Gradually Appeared Comorbid Hypertension, Type II Diabetes Date Acquired: 08/17/2016 History: Weeks Of Treatment: 1 Clustered Wound: No Photos Wound Measurements Length: (cm) 2.6 Width: (cm) 1.2 Depth: (cm) 0.3 Area: (cm) 2.45 Volume: (cm) 0.735 % Reduction in Area: 30.7% % Reduction in Volume: 48% Epithelialization:  None Tunneling: No Undermining: No Wound Description Classification: Grade 2 Wound Margin: Distinct, outline attached Exudate Amount: Medium Exudate Type: Serosanguineous Exudate Color: red, brown Foul Odor After Cleansing: No Wound Bed Granulation Amount: None Present (0%) Exposed Structure  Necrotic Amount: Large (67-100%) Fascia Exposed: No Necrotic Quality: Adherent Slough Fat Layer Exposed: Yes Tendon Exposed: No Muscle Exposed: No Joint Exposed: No Barrington, Talon G. ([id number]) Bone Exposed: No Periwound Skin Texture Texture Color No Abnormalities Noted: No No Abnormalities Noted: No Callus: No Atrophie Blanche: No Crepitus: No Cyanosis: No Excoriation: No Ecchymosis: No Fluctuance: No Erythema: No Friable: No Hemosiderin Staining: No Induration: No Mottled: Yes Localized Edema: Yes Pallor: No Rash: No Rubor: No Scarring: Yes Temperature / Pain Moisture Temperature: No Abnormality No Abnormalities Noted: No Tenderness on Palpation: Yes Dry / Scaly: No Maceration: No Moist: Yes Wound Preparation Ulcer Cleansing: Rinsed/Irrigated with Saline Topical Anesthetic Applied: Other: Lidocaine 4%, Treatment Notes Wound #1 (Right, Anterior Lower Leg) 1. Cleansed with: Clean wound with Normal Saline 2. Anesthetic Topical Lidocaine 4% cream to wound bed prior to debridement 4. Dressing Applied: Santyl Ointment 5. Secondary Dressing Applied Dry Gauze 7. Secured with 3 Layer Compression System - Right Lower Extremity Electronic Signature(s) Signed: 10/14/2016 6:06:27 PM By: Gretta Cool, RN, BSN, Kim RN, BSN Entered By: Gretta Cool, RN, BSN, Kim on 10/14/2016 11:08:15 Lee Bryant ([id number]) -------------------------------------------------------------------------------- Wound Assessment Details Lee Bryant Date of Service: 10/14/2016 10:15 AM Patient Name: G. Patient Account Number: [id number] Medical Record Treating RN: Cornell Barman [id number] Number: Other Clinician: Date of Birth/Sex: 08-May-1956 (60 y.o. Male) Treating ROBSON, [place] Primary Care Physician: Tracie Harrier Physician/Extender: G Referring Physician: Tracie Harrier Weeks in Treatment: 1 Wound Status Wound Number: 2 Primary Diabetic Wound/Ulcer of the Lower Etiology: Extremity Wound Location: Right Malleolus - Medial Wound Status: Open Wounding Event: Gradually Appeared Comorbid Hypertension, Type II Diabetes Date Acquired: 08/17/2016 History: Weeks Of Treatment: 1 Clustered Wound: No Photos Wound Measurements Length: (cm) 0.3 Width: (cm) 0.5 Depth: (cm) 0.1 Area: (cm) 0.118 Volume: (cm) 0.012 % Reduction in Area: 39.8% % Reduction in Volume: 40% Epithelialization: None Tunneling: No Undermining: No Wound Description Classification: Grade 1 Wound Margin: Distinct, outline attached Exudate Amount: None Present Foul Odor After Cleansing: No Wound Bed Granulation Amount: None Present (0%) Exposed Structure Necrotic Amount: Large (67-100%) Fascia Exposed: No Necrotic Quality: Eschar Fat Layer Exposed: No Tendon Exposed: No Muscle Exposed: No Joint Exposed: No Bone Exposed: No Limited to Skin Breakdown Ide, Marsha G. ([id number]) Periwound Skin Texture Texture Color No Abnormalities Noted: No No Abnormalities Noted: No Callus: No Atrophie Blanche: No Crepitus: No Cyanosis: No Excoriation: No Ecchymosis: No Fluctuance: No Erythema: No Friable: No Hemosiderin Staining: No Induration: No Mottled: Yes Localized Edema: Yes Pallor: No Rash: No Rubor: No Scarring: No Temperature / Pain Moisture Temperature: No Abnormality No Abnormalities Noted: No Tenderness on Palpation: Yes Dry / Scaly: No Maceration: No Moist: Yes Wound Preparation Ulcer Cleansing: Rinsed/Irrigated with Saline Topical Anesthetic Applied: Other: Lidocaine 4%, Treatment Notes Wound #2 (Right, Medial Malleolus) 1. Cleansed  with: Clean wound with Normal Saline 2. Anesthetic Topical Lidocaine 4% cream to wound bed prior to debridement 4. Dressing Applied: Santyl Ointment 5. Secondary Dressing Applied Dry Gauze 7. Secured with 3 Layer Compression System - Right Lower Extremity Electronic Signature(s) Signed: 10/14/2016 6:06:27 PM By: Gretta Cool, RN, BSN, Kim RN, BSN Entered By: Gretta Cool, RN, BSN, Kim on 10/14/2016 11:09:21 Lee Bryant ([id number]) -------------------------------------------------------------------------------- Vitals Details Lee Bryant Date of Service: 10/14/2016 10:15 AM Patient Name: G. Patient Account Number: [id number] Medical Record Treating RN: Cornell Barman [id number] Number: Other Clinician: Date of Birth/Sex: 08/16/1956 (60 y.o. Male) Treating Linton Ham Primary Care Physician: Tracie Harrier Physician/Extender: G Referring Physician: Tracie Harrier Weeks in Treatment:  1 Vital Signs Time Taken: 10:54 Temperature (F): 98.2 Height (in): 73 Pulse (bpm): 95 Source: Stated Respiratory Rate (breaths/min): 16 Weight (lbs): 273 Blood Pressure (mmHg): 164/91 Source: Measured Reference Range: 80 - 120 mg / dl Body Mass Index (BMI): 36 Electronic Signature(s) Signed: 10/14/2016 6:06:27 PM By: Gretta Cool, RN, BSN, Kim RN, BSN Entered By: Gretta Cool, RN, BSN, Kim on 10/14/2016 10:56:58

## 2016-10-19 HISTORY — PX: OTHER SURGICAL HISTORY: SHX169

## 2016-10-21 ENCOUNTER — Ambulatory Visit (INDEPENDENT_AMBULATORY_CARE_PROVIDER_SITE_OTHER): Payer: BLUE CROSS/BLUE SHIELD

## 2016-10-21 ENCOUNTER — Encounter: Payer: BLUE CROSS/BLUE SHIELD | Attending: Internal Medicine | Admitting: Internal Medicine

## 2016-10-21 DIAGNOSIS — E11622 Type 2 diabetes mellitus with other skin ulcer: Secondary | ICD-10-CM | POA: Insufficient documentation

## 2016-10-21 DIAGNOSIS — I739 Peripheral vascular disease, unspecified: Secondary | ICD-10-CM

## 2016-10-21 DIAGNOSIS — I10 Essential (primary) hypertension: Secondary | ICD-10-CM | POA: Diagnosis not present

## 2016-10-21 DIAGNOSIS — S81809A Unspecified open wound, unspecified lower leg, initial encounter: Secondary | ICD-10-CM

## 2016-10-21 DIAGNOSIS — L97311 Non-pressure chronic ulcer of right ankle limited to breakdown of skin: Secondary | ICD-10-CM | POA: Insufficient documentation

## 2016-10-21 DIAGNOSIS — E781 Pure hyperglyceridemia: Secondary | ICD-10-CM | POA: Diagnosis not present

## 2016-10-21 DIAGNOSIS — Z7984 Long term (current) use of oral hypoglycemic drugs: Secondary | ICD-10-CM | POA: Diagnosis not present

## 2016-10-21 DIAGNOSIS — M792 Neuralgia and neuritis, unspecified: Secondary | ICD-10-CM | POA: Insufficient documentation

## 2016-10-21 DIAGNOSIS — I87311 Chronic venous hypertension (idiopathic) with ulcer of right lower extremity: Secondary | ICD-10-CM | POA: Insufficient documentation

## 2016-10-21 DIAGNOSIS — L97211 Non-pressure chronic ulcer of right calf limited to breakdown of skin: Secondary | ICD-10-CM | POA: Diagnosis not present

## 2016-10-21 NOTE — Progress Notes (Addendum)
Lee Bryant, Lee Bryant ([id number]) Visit Report for 10/21/2016 Arrival Information Details Lee Bryant, Lee Bryant Date of Service: 10/21/2016 8:15 AM Patient Name: Lee Bryant. Patient Account Number: [id number] Medical Record Treating RN: Baruch Gouty, RN, BSN, [name] [id number] Number: Other Clinician: Date of Birth/Sex: [birth year] (61 y.o. Male) Treating Linton Ham Primary Care Physician: Tracie Harrier Physician/Extender: Lee Bryant Referring Physician: Metta Clines in Treatment: 2 Visit Information History Since Last Visit All ordered tests and consults were completed: No Patient Arrived: Ambulatory Added or deleted any medications: No Arrival Time: 08:12 Any new allergies or adverse reactions: No Accompanied By: wife Had a fall or experienced change in No Transfer Assistance: None activities of daily living that may affect Patient Identification Verified: Yes risk of falls: Secondary Verification Process Yes Signs or symptoms of abuse/neglect since last No Completed: visito Patient Requires Transmission- No Hospitalized since last visit: No Based Precautions: Has Dressing in Place as Prescribed: Yes Patient Has Alerts: Yes Has Compression in Place as Prescribed: Yes Patient Alerts: Patient on Blood Pain Present Now: No Thinner Type II Diabetic 81mg  Aspirin Electronic Signature(s) Signed: 10/21/2016 8:12:53 AM By: Regan Lemming BSN, RN Entered By: Regan Lemming on 10/21/2016 08:12:53 Lee Bryant ([id number]) -------------------------------------------------------------------------------- Clinic Level of Care Assessment Details [name] Date of Service: 10/21/2016 8:15 AM Patient Name: Lee Bryant. Patient Account Number: [id number] Medical Record Treating RN: Baruch Gouty, RN, BSN, [name] [id number] Number: Other Clinician: Date of Birth/Sex: [birth year] (61 y.o. Male) Treating ROBSON, [place] Primary Care Physician: Tracie Harrier Physician/Extender: Lee Bryant Referring Physician: Metta Clines in Treatment: 2 Clinic Level of Care Assessment Items TOOL 4 Quantity Score []  - Use when only an EandM is performed on FOLLOW-UP visit 0 ASSESSMENTS - Nursing Assessment / Reassessment X - Reassessment of Co-morbidities (includes updates in patient status) 1 10 X - Reassessment of Adherence to Treatment Plan 1 5 ASSESSMENTS - Wound and Skin Assessment / Reassessment []  - Simple Wound Assessment / Reassessment - one wound 0 X - Complex Wound Assessment / Reassessment - multiple wounds 2 5 []  - Dermatologic / Skin Assessment (not related to wound area) 0 ASSESSMENTS - Focused Assessment []  - Circumferential Edema Measurements - multi extremities 0 []  - Nutritional Assessment / Counseling / Intervention 0 X - Lower Extremity Assessment (monofilament, tuning fork, pulses) 1 5 []  - Peripheral Arterial Disease Assessment (using hand held doppler) 0 ASSESSMENTS - Ostomy and/or Continence Assessment and Care []  - Incontinence Assessment and Management 0 []  - Ostomy Care Assessment and Management (repouching, etc.) 0 PROCESS - Coordination of Care X - Simple Patient / Family Education for ongoing care 1 15 []  - Complex (extensive) Patient / Family Education for ongoing care 0 []  - Staff obtains Programmer, systems, Records, Test Results / Process Orders 0 []  - Staff telephones HHA, Nursing Homes / Clarify orders / etc 0 Lee Bryant, Lee Lee Bryant. ([id number]) []  - Routine Transfer to another Facility (non-emergent condition) 0 []  - Routine Hospital Admission (non-emergent condition) 0 []  - New Admissions / Biomedical engineer / Ordering NPWT, Apligraf, etc. 0 []  - Emergency Hospital Admission (emergent condition) 0 []  - Simple Discharge Coordination 0 []  - Complex (extensive) Discharge Coordination 0 PROCESS - Special Needs []  - Pediatric / Minor Patient Management 0 []  - Isolation Patient Management 0 []  - Hearing / Language / Visual special needs 0 []  - Assessment of Community  assistance (transportation, D/C planning, etc.) 0 []  - Additional assistance / Altered mentation 0 []  - Support Surface(s) Assessment (bed, cushion, seat, etc.) 0 INTERVENTIONS - Wound Cleansing /  Measurement []  - Simple Wound Cleansing - one wound 0 X - Complex Wound Cleansing - multiple wounds 2 5 X - Wound Imaging (photographs - any number of wounds) 1 5 []  - Wound Tracing (instead of photographs) 0 []  - Simple Wound Measurement - one wound 0 X - Complex Wound Measurement - multiple wounds 2 5 INTERVENTIONS - Wound Dressings X - Small Wound Dressing one or multiple wounds 2 10 []  - Medium Wound Dressing one or multiple wounds 0 []  - Large Wound Dressing one or multiple wounds 0 []  - Application of Medications - topical 0 []  - Application of Medications - injection 0 Lee Bryant, Lee Lee Bryant. ([id number]) INTERVENTIONS - Miscellaneous []  - External ear exam 0 []  - Specimen Collection (cultures, biopsies, blood, body fluids, etc.) 0 []  - Specimen(s) / Culture(s) sent or taken to Lab for analysis 0 []  - Patient Transfer (multiple staff / Harrel Lemon Lift / Similar devices) 0 []  - Simple Staple / Suture removal (25 or less) 0 []  - Complex Staple / Suture removal (26 or more) 0 []  - Hypo / Hyperglycemic Management (close monitor of Blood Glucose) 0 []  - Ankle / Brachial Index (ABI) - do not check if billed separately 0 X - Vital Signs 1 5 Has the patient been seen at the hospital within the last three years: Yes Total Score: 95 Level Of Care: New/Established - Level 3 Electronic Signature(s) Signed: 10/21/2016 4:04:09 PM By: Regan Lemming BSN, RN Entered By: Regan Lemming on 10/21/2016 15:56:35 Lee Bryant ([id number]) -------------------------------------------------------------------------------- Encounter Discharge Information Details [name] Date of Service: 10/21/2016 8:15 AM Patient Name: Lee Bryant. Patient Account Number: [id number] Medical Record Treating RN: Baruch Gouty, RN, BSN,  [name] [id number] Number: Other Clinician: Date of Birth/Sex: 09-20-1956 (60 y.o. Male) Treating Linton Ham Primary Care Physician: Tracie Harrier Physician/Extender: Lee Bryant Referring Physician: Metta Clines in Treatment: 2 Encounter Discharge Information Items Discharge Pain Level: 0 Discharge Condition: Stable Ambulatory Status: Ambulatory Discharge Destination: Home Transportation: Private Auto Accompanied By: self Schedule Follow-up Appointment: No Medication Reconciliation completed and provided to Patient/Care No Lee Bryant: Provided on Clinical Summary of Care: 10/21/2016 Form Type Recipient Paper Patient EM Electronic Signature(s) Signed: 10/21/2016 3:55:31 PM By: Regan Lemming BSN, RN Previous Signature: 10/21/2016 8:59:57 AM Version By: Ruthine Dose Entered By: Regan Lemming on 10/21/2016 15:55:31 Lee Bryant ([id number]) -------------------------------------------------------------------------------- Lower Extremity Assessment Details [name] Date of Service: 10/21/2016 8:15 AM Patient Name: Lee Bryant. Patient Account Number: [id number] Medical Record Treating RN: Baruch Gouty, RN, BSN, [name] [id number] Number: Other Clinician: Date of Birth/Sex: 08/15/1956 (60 y.o. Male) Treating Linton Ham Primary Care Physician: Tracie Harrier Physician/Extender: Lee Bryant Referring Physician: Tracie Harrier Weeks in Treatment: 2 Edema Assessment Assessed: [Left: No] [Right: No] E[Left: dema] [Right: :] Calf Left: Right: Point of Measurement: 37 cm From Medial Instep cm 40 cm Ankle Left: Right: Point of Measurement: 9 cm From Medial Instep cm 26 cm Vascular Assessment Claudication: Claudication Assessment [Right:None] Pulses: Dorsalis Pedis Palpable: [Right:Yes] Posterior Tibial Extremity colors, hair growth, and conditions: Extremity Color: [Right:Mottled] Hair Growth on Extremity: [Right:No] Temperature of Extremity: [Right:Warm] Capillary Refill:  [Right:< 3 seconds] Toe Nail Assessment Left: Right: Thick: No Discolored: No Deformed: No Improper Length and Hygiene: No Electronic Signature(s) Signed: 10/21/2016 4:04:09 PM By: Regan Lemming BSN, RN Lee Bryant ([id number]) Entered By: Regan Lemming on 10/21/2016 08:21:29 Lee Bryant ([id number]) -------------------------------------------------------------------------------- Multi Wound Chart Details [name] Date of Service: 10/21/2016 8:15 AM Patient Name: Lee Bryant. Patient Account Number: [id number] Medical Record Treating RN: Baruch Gouty, RN,  BSN, [name] [id number] Number: Other Clinician: Date of Birth/Sex: 29-Oct-1955 (61 y.o. 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] [2:the Lower Extremity] [N/A:N/A] Comorbid History: [1:Hypertension, Type II Diabetes] [2:Hypertension, Type II Diabetes] [N/A:N/A] Date Acquired: [1:08/17/2016] [2:08/17/2016] [N/A:N/A] Weeks of Treatment: [1:2] [2:2] [N/A:N/A] Wound Status: [1:Open] [2:Open] [N/A:N/A] Measurements L x W x D 3.5x1.5x0.3 [2:0.5x1x0.1] [N/A:N/A] (cm) Area (cm) : [1:4.123] [2:0.393] [N/A:N/A] Volume (cm) : [1:1.237] [2:0.039] [N/A:N/A] % Reduction in Area: [1:-16.70%] [2:-100.50%] [N/A:N/A] % Reduction in Volume: 12.50% [2:-95.00%] [N/A:N/A] Classification: [1:Grade 2] [2:Grade 1] [N/A:N/A] Exudate Amount: [1:Medium] [2:Medium]  [N/A:N/A] Exudate Type: [1:Serosanguineous] [2:Serosanguineous] [N/A:N/A] Exudate Color: [1:red, brown] [2:red, brown] [N/A:N/A] Wound Margin: [1:Distinct, outline attached Distinct, outline attached] [N/A:N/A] Granulation Amount: [1:None Present (0%)] [2:Medium (34-66%)] [N/A:N/A] Granulation Quality: [1:N/A] [2:Pink, Pale] [N/A:N/A] Necrotic Amount: [1:Large (67-100%)] [2:Medium (34-66%)] [N/A:N/A] Necrotic Tissue: [1:Adherent Slough] [2:Eschar] [N/A:N/A] Exposed Structures: [1:Fat: Yes Fascia: No Tendon: No] [2:Fascia: No Fat: No Tendon: No] [N/A:N/A] Muscle: No Muscle: No Joint: No Joint: No Bone: No Bone: No Limited to Skin Breakdown Epithelialization: None None N/A Periwound Skin Texture: Edema: Yes Edema: Yes N/A Scarring: Yes Excoriation: No Excoriation: No Induration: No Induration: No Callus: No Callus: No Crepitus: No Crepitus: No Fluctuance: No Fluctuance: No Friable: No Friable: No Rash: No Rash: No Scarring: No Periwound Skin Moist: Yes Moist: Yes N/A Moisture: Maceration: No Maceration: No Dry/Scaly: No Dry/Scaly: No Periwound Skin Color: Mottled: Yes Mottled: Yes N/A Atrophie Blanche: No Atrophie Blanche: No Cyanosis: No Cyanosis: No Ecchymosis: No Ecchymosis: No Erythema: No Erythema: No Hemosiderin Staining: No Hemosiderin Staining: No Pallor: No Pallor: No Rubor: No Rubor: No Temperature: No Abnormality No Abnormality N/A Tenderness on Yes Yes N/A Palpation: Wound Preparation: Ulcer Cleansing: Ulcer Cleansing: N/A Rinsed/Irrigated with Rinsed/Irrigated with Saline Saline, Other: surg scrub and water Topical Anesthetic Applied: Other: Lidocaine Topical Anesthetic 4% Applied: Other: Lidocaine 4% Treatment Notes Wound #1 (Right, Anterior Lower Leg) 1. Cleansed with: Cleanse wound with antibacterial soap and water 4. Dressing Applied: Santyl Ointment 5. Secondary [place] Wound #2 (Right, Medial  Malleolus) 1. Cleansed with: KORION, CUEVAS. ([id number]) Cleanse wound with antibacterial soap and water 4. Dressing Applied: Santyl Ointment 5. Secondary [place] Signature(s) Signed: 10/21/2016 5:37:12 PM By: Linton Ham MD Entered By: Linton Ham on 10/21/2016 08:55:33 Lee Bryant ([id number]) -------------------------------------------------------------------------------- Multi-Disciplinary Care Plan Details [name] Date of Service: 10/21/2016 8:15 AM Patient Name: Lee Bryant. Patient Account Number: [id number] Medical Record Treating RN: Baruch Gouty, RN, BSN, [name] [id number] Number: Other Clinician: Date of Birth/Sex: 09/16/1956 (61 y.o. Male) Treating Linton Ham Primary Care Physician: Tracie Harrier Physician/Extender: Lee Bryant Referring Physician: Metta Clines in Treatment: 2 Active Inactive Orientation to the Wound Care Program Nursing Diagnoses: Knowledge deficit related to the wound healing center program Goals: Patient/caregiver will verbalize understanding of the [place] Program Date Initiated: 10/07/2016 Goal Status: Active Interventions: Provide education on orientation to the wound center Notes: Wound/Skin Impairment Nursing Diagnoses: Impaired tissue integrity Knowledge deficit related to ulceration/compromised skin integrity Goals: Patient/caregiver will verbalize understanding of skin care regimen Date Initiated: 10/07/2016 Goal  Status: Active Ulcer/skin breakdown will have a volume reduction of 30% by week 4 Date Initiated: 10/07/2016 Goal Status: Active Ulcer/skin breakdown will have a volume reduction of 50% by week 8 Date Initiated: 10/07/2016 Goal Status: Active Ulcer/skin breakdown will have a volume reduction of 80% by week 12 Date Initiated: 10/07/2016 Goal Status: Active Ulcer/skin breakdown will heal within 14 weeks Lee Bryant, Lee Bryant ([id number]) Date Initiated:  10/07/2016 Goal Status: Active Interventions: Assess patient/caregiver ability to obtain necessary supplies Assess patient/caregiver ability to perform ulcer/skin care regimen upon admission and as needed Assess ulceration(s) every visit Provide education on ulcer and skin care Treatment Activities: Referred to DME Kasheena Sambrano for dressing supplies : 10/07/2016 Skin care regimen initiated : 10/07/2016 Topical wound management initiated : 10/07/2016 Notes: Electronic Signature(s) Signed: 10/21/2016 4:04:09 PM By: Regan Lemming BSN, RN Entered By: Regan Lemming on 10/21/2016 62:70:35 Lee Bryant ([id number]) -------------------------------------------------------------------------------- Pain Assessment Details [name] Date of Service: 10/21/2016 8:15 AM Patient Name: Lee Bryant. Patient Account Number: [id number] Medical Record Treating RN: Baruch Gouty, RN, BSN, [name] [id number] Number: Other Clinician: Date of Birth/Sex: 1956-02-04 (61 y.o. Male) Treating Linton Ham Primary Care Physician: Tracie Harrier Physician/Extender: Lee Bryant Referring Physician: Tracie Harrier Weeks in Treatment: 2 Active Problems Location of Pain Severity and Description of Pain Patient Has Paino No Site Locations With Dressing Change: No Pain Management and Medication Current Pain Management: Electronic Signature(s) Signed: 10/21/2016 4:04:09 PM By: Regan Lemming BSN, RN Entered By: Regan Lemming on 10/21/2016 08:17:20 Lee Bryant ([id number]) -------------------------------------------------------------------------------- Patient/Caregiver Education Details [name] Date of Service: 10/21/2016 8:15 AM Patient Name: Lee Bryant. Patient Account Number: [id number] Medical Record Treating RN: Baruch Gouty, RN, BSN, [name] [id number] Number: Other Clinician: Date of Birth/Gender: 09/25/1956 (60 y.o. Male) Treating ROBSON, [place] Primary Care Physician: Tracie Harrier Physician/Extender: Lee Bryant Referring Physician:  Metta Clines in Treatment: 2 Education Assessment Education Provided To: Patient Education Topics Provided Welcome To [facility]: Methods: Explain/Verbal Responses: State content correctly Wound/Skin Impairment: Methods: Explain/Verbal Responses: State content correctly Electronic Signature(s) Signed: 10/21/2016 4:04:09 PM By: Regan Lemming BSN, RN Entered By: Regan Lemming on 10/21/2016 15:55:44 Lee Bryant ([id number]) -------------------------------------------------------------------------------- Wound Assessment Details [name] Date of Service: 10/21/2016 8:15 AM Patient Name: Lee Bryant. Patient Account Number: [id number] Medical Record Treating RN: Baruch Gouty, RN, BSN, [name] [id number] Number: Other Clinician: Date of Birth/Sex: [birth year] (60 y.o. Male) Treating ROBSON, [place] Primary Care Physician: Tracie Harrier Physician/Extender: Lee Bryant Referring Physician: Tracie Harrier Weeks in Treatment: 2 Wound Status Wound Number: 1 Primary Diabetic Wound/Ulcer of the Lower Etiology: Extremity Wound Location: Right Lower Leg - Anterior Wound Status: Open Wounding Event: Gradually Appeared Comorbid Hypertension, Type II Diabetes Date Acquired: 08/17/2016 History: Weeks Of Treatment: 2 Clustered Wound: No Wound Measurements Length: (cm) 3.5 Width: (cm) 1.5 Depth: (cm) 0.3 Area: (cm) 4.123 Volume: (cm) 1.237 % Reduction in Area: -16.7% % Reduction in Volume: 12.5% Epithelialization: None Tunneling: No Undermining: No Wound Description Classification: Grade 2 Wound Margin: Distinct, outline attached Exudate Amount: Medium Exudate Type: Serosanguineous Exudate Color: red, brown Foul Odor After Cleansing: No Wound Bed Granulation Amount: None Present (0%) Exposed Structure Necrotic Amount: Large (67-100%) Fascia Exposed: No Necrotic Quality: Adherent Slough Fat Layer Exposed: Yes Tendon Exposed: No Muscle Exposed: No Joint Exposed:  No Bone Exposed: No Periwound Skin Texture Texture Color No Abnormalities Noted: No No Abnormalities Noted: No Callus: No Atrophie Blanche: No Crepitus: No Cyanosis: No Excoriation: No Ecchymosis: No Lee Bryant, Lee Lee Bryant. ([id number]) Fluctuance: No Erythema: No Friable: No Hemosiderin Staining: No Induration: No  Mottled: Yes Localized Edema: Yes Pallor: No Rash: No Rubor: No Scarring: Yes Temperature / Pain Moisture Temperature: No Abnormality No Abnormalities Noted: No Tenderness on Palpation: Yes Dry / Scaly: No Maceration: No Moist: Yes Wound Preparation Ulcer Cleansing: Rinsed/Irrigated with Saline Topical Anesthetic Applied: Other: Lidocaine 4%, Treatment Notes Wound #1 (Right, Anterior Lower Leg) 1. Cleansed with: Cleanse wound with antibacterial soap and water 4. Dressing Applied: Santyl Ointment 5. Secondary [place] Signature(s) Signed: 10/21/2016 4:04:09 PM By: Regan Lemming BSN, RN Entered By: Regan Lemming on 10/21/2016 08:25:56 Lee Bryant ([id number]) -------------------------------------------------------------------------------- Wound Assessment Details [name] Date of Service: 10/21/2016 8:15 AM Patient Name: Lee Bryant. Patient Account Number: [id number] Medical Record Treating RN: Baruch Gouty, RN, BSN, [name] [id number] Number: Other Clinician: Date of Birth/Sex: 1956-06-29 (60 y.o. Male) Treating ROBSON, [place] Primary Care Physician: Tracie Harrier Physician/Extender: Lee Bryant Referring Physician: Tracie Harrier Weeks in Treatment: 2 Wound Status Wound Number: 2 Primary Diabetic Wound/Ulcer of the Lower Etiology: Extremity Wound Location: Right Malleolus - Medial Wound Status: Open Wounding Event: Gradually Appeared Comorbid Hypertension, Type II Diabetes Date Acquired: 08/17/2016 History: Weeks Of Treatment: 2 Clustered Wound: No Wound Measurements Length: (cm) 0.5 Width: (cm) 1 Depth: (cm) 0.1 Area:  (cm) 0.393 Volume: (cm) 0.039 % Reduction in Area: -100.5% % Reduction in Volume: -95% Epithelialization: None Tunneling: No Undermining: No Wound Description Classification: Grade 1 Wound Margin: Distinct, outline attached Exudate Amount: Medium Exudate Type: Serosanguineous Exudate Color: red, brown Foul Odor After Cleansing: No Wound Bed Granulation Amount: Medium (34-66%) Exposed Structure Granulation Quality: Pink, Pale Fascia Exposed: No Necrotic Amount: Medium (34-66%) Fat Layer Exposed: No Necrotic Quality: Eschar Tendon Exposed: No Muscle Exposed: No Joint Exposed: No Bone Exposed: No Limited to Skin Breakdown Periwound Skin Texture Texture Color No Abnormalities Noted: No No Abnormalities Noted: No Callus: No Atrophie Blanche: No Crepitus: No Cyanosis: No Lee Bryant, Lee Lee Bryant. ([id number]) Excoriation: No Ecchymosis: No Fluctuance: No Erythema: No Friable: No Hemosiderin Staining: No Induration: No Mottled: Yes Localized Edema: Yes Pallor: No Rash: No Rubor: No Scarring: No Temperature / Pain Moisture Temperature: No Abnormality No Abnormalities Noted: No Tenderness on Palpation: Yes Dry / Scaly: No Maceration: No Moist: Yes Wound Preparation Ulcer Cleansing: Rinsed/Irrigated with Saline, Other: surg scrub and water, Topical Anesthetic Applied: Other: Lidocaine 4%, Treatment Notes Wound #2 (Right, Medial Malleolus) 1. Cleansed with: Cleanse wound with antibacterial soap and water 4. Dressing Applied: Santyl Ointment 5. [place] Signature(s) Signed: 10/21/2016 4:04:09 PM By: Regan Lemming BSN, RN Entered By: Regan Lemming on 10/21/2016 08:65:78 Lee Bryant ([id number]) -------------------------------------------------------------------------------- Vitals Details [name] Date of Service: 10/21/2016 8:15 AM Patient Name: Lee Bryant. Patient Account Number: [id number] Medical Record Treating  RN: Baruch Gouty, RN, BSN, [name] [id number] Number: Other Clinician: Date of Birth/Sex: 09/28/1956 (60 y.o. Male) Treating ROBSON, [place] Primary Care Physician: Tracie Harrier Physician/Extender: Lee Bryant Referring Physician: Tracie Harrier Weeks in Treatment: 2 Vital Signs Time Taken: 08:17 Temperature (F): 98.1 Height (in): 73 Pulse (bpm): 100 Weight (lbs): 273 Respiratory Rate (breaths/min): 16 Body Mass Index (BMI): 36 Blood Pressure (mmHg): 170/100 Reference Range: 80 - 120 mg / dl Electronic Signature(s) Signed: 10/21/2016 4:04:09 PM By: Regan Lemming BSN, RN Entered By: Regan Lemming on 10/21/2016 08:17:45

## 2016-10-22 NOTE — Progress Notes (Signed)
[name], [name] ([id number]) Visit Report for 10/21/2016 Chief Complaint Document Details Lee Bryant Date of Service: 10/21/2016 8:15 AM Patient Name: G. Patient Account Number: [id number] Medical Record Treating RN: Lee Gouty RN, BSN, [name] [id number] Number: Other Clinician: Date of Birth/Sex: [birth year] (60 y.o. Male) Treating Linton Ham Primary Care Physician/Extender: Lee Bryant, Lee Bryant Physician: Referring Physician: Metta Bryant in Treatment: 2 Information Obtained from: Patient Chief Complaint 10/07/16; the patient is here for review of 2 wounds on the Bryant anterior lower leg and the Bryant medial ankle. Electronic Signature(s) Signed: 10/21/2016 5:37:12 PM By: Linton Ham MD Entered By: Linton Ham on 10/21/2016 08:56:03 Lee Bryant ([id number]) -------------------------------------------------------------------------------- HPI Details Lee Bryant Date of Service: 10/21/2016 8:15 AM Patient Name: G. Patient Account Number: [id number] Medical Record Treating RN: Lee Gouty RN, BSN, [name] [id number] Number: Other Clinician: Date of Birth/Sex: 24-Jun-1956 (60 y.o. Male) Treating Linton Ham Primary Care Physician/Extender: Lee Bryant, Lee Bryant Physician: Referring Physician: Metta Bryant in Treatment: 2 History of Present Illness HPI Description: 10/07/16; patient is a 61 year old male type II diabetic on oral agents. He was apparently cleaning out his pond and traumatized his Bryant anterior leg on since late roughly 3-4 months ago. 3 days later he noted an open area on the Bryant anterior leg. Exact course of this is not completely clear or resolve his primary physician on 12/15. A culture was done and according to the patient a culture was done and Cipro was prescribed at 500 mg twice a day. Do not have the exact culture results. Also his primary physician arranged arterial studies for after the holidays which seems  reasonable. Within the last week or 2 secondary wound has come up on the Bryant medial malleoli area. Again the cause of this is not completely clear he does not complain of trauma. ABIs in this clinic was 0.9 bilaterally. He does not give a history of claudication. He has a history of a traumatic wound on the left anterior leg many years ago which healed. For the Cipro he appears to bring given a course of clindamycin. Recently his has been treating this with an ointment called Terrasil which we googled to find it is a combination of multiple products including silver, zinc oxide. They claim that that is done a lot for helping improve this wound. In reviewing his primary 81 notes occur [place] [facility] from 12/15 he is listed as having type 2 diabetes without complication, essential hypertension, hypertriglyceridemia and neuralgia/neuritis unspecified 10/14/16; patient has 3 reasonably small punched out wounds on the Bryant leg. 2 on the anterior part of the calf and one on the medial Bryant ankle. He had told me that his primary physician have arranged for arterial studies last week. As it turns out I think these were studies that were going to be done in his clinic. The patient would not allow him to take the dressing off. I would rather formal arterial studies in this setting in any case. ABIs in our clinic were 0.9 bilaterally. I would like to see arterial Dopplers as well as ABIs 10/21/16; the patient has 2 small punched out areas on the Bryant anterior lower leg and one on the Bryant medial ankle. As it turns out he took his wrap off over the weekend because there was too much pain at night. We're using Profore lites ABIs in this clinic were 0.9 bilaterally. He has formal arterial studies this afternoon Electronic Signature(s) Signed: 10/21/2016 5:37:12 PM By: Linton Ham MD Entered By: Linton Ham on 10/21/2016  08:57:41 Lee Bryant, Lee Bryant  ([id number]) -------------------------------------------------------------------------------- Physical Exam Details Lee Bryant Date of Service: 10/21/2016 8:15 AM Patient Name: G. Patient Account Number: [id number] Medical Record Treating RN: Lee Gouty RN, BSN, [name] [id number] Number: Other Clinician: Date of Birth/Sex: [birth year] (61 y.o. Male) Treating Linton Ham Primary Care Physician/Extender: Lee Bryant, Lee Bryant Physician: Referring Physician: Metta Bryant in Treatment: 2 Constitutional Patient is hypertensive.. Pulse regular and within target range for patient.Marland Kitchen Respirations regular, non-labored and within target range.. Temperature is normal and within the target range for the patient.. Patient's appearance is neat and clean. Appears in no acute distress. Well nourished and well developed.. Eyes Conjunctivae clear. No discharge.Marland Kitchen Respiratory Respiratory effort is easy and symmetric bilaterally. Rate is normal at rest and on room air.. Cardiovascular Heart rhythm and rate regular, without murmur or gallop.. Femoral pulses are reduced. Rosales pedis pulses are reduced. Minimal edema. Signs of chronic venous since insufficiency. Lymphatic None palpable in the popliteal or inguinal area. Psychiatric No evidence of depression, anxiety, or agitation. Calm, cooperative, and communicative. Appropriate interactions and affect.. Notes Wound exam; the patient has 2 small wounds on the Bryant anterior leg and one on the Bryant medial ankle below the Bryant medial malleolus. All of these have surface slough. No debridement was done today as the patient is going for his arterial studies this afternoon Electronic Signature(s) Signed: 10/21/2016 5:37:12 PM By: Linton Ham MD Entered By: Linton Ham on 10/21/2016 09:00:58 Lee Bryant ([id number]) -------------------------------------------------------------------------------- Physician Orders Details Lee Bryant Date of Service: 10/21/2016 8:15 AM Patient Name: G. Patient Account Number: [id number] Medical Record Treating RN: Lee Gouty RN, BSN, [name] [id number] Number: Other Clinician: Date of Birth/Sex: March 29, 1956 (60 y.o. Male) Treating Astin Sayre Primary Care Physician/Extender: Lee Bryant, Lee Bryant Physician: Referring Physician: Metta Bryant in Treatment: 2 Verbal / Phone Orders: Yes Clinician: Afful, RN, BSN, Rita Read Back and Verified: Yes Diagnosis Coding Wound Cleansing Wound #1 Bryant,Anterior Lower Leg o Cleanse wound with mild soap and water o May Shower, gently pat wound dry prior to applying new dressing. Wound #2 Bryant,Medial Malleolus o Cleanse wound with mild soap and water o May Shower, gently pat wound dry prior to applying new dressing. Anesthetic Wound #1 Bryant,Anterior Lower Leg o Topical Lidocaine 4% cream applied to wound bed prior to debridement Wound #2 Bryant,Medial Malleolus o Topical Lidocaine 4% cream applied to wound bed prior to debridement Primary Wound Dressing Wound #1 Bryant,Anterior Lower Leg o Santyl Ointment Wound #2 Bryant,Medial Malleolus o Santyl Ointment Secondary Dressing Wound #1 Bryant,Anterior Lower Leg o Dry Gauze - 2x2 Wound #2 Bryant,Medial Malleolus o Dry Gauze - 2x2 Dressing Change Frequency Wound #1 Bryant,Anterior Lower Leg o Change dressing every week Klingensmith, Candido G. ([id number]) Wound #2 Bryant,Medial Malleolus o Change dressing every week Follow-up Appointments Wound #1 Bryant,Anterior Lower Leg o Return Appointment in 1 week. Wound #2 Bryant,Medial Malleolus o Return Appointment in 1 week. Edema Control Wound #1 Bryant,Anterior Lower Leg o Patient to wear own Juxtalite/Juzo compression garment. - order/patient stated he has already ordered himself one Wound #2 Bryant,Medial Malleolus o Patient to wear own Juxtalite/Juzo compression garment. - order/patient stated he has  already ordered himself one Additional Orders / Instructions Wound #1 Bryant,Anterior Lower Leg o Increase protein intake. o Activity as tolerated Wound #2 Bryant,Medial Malleolus o Increase protein intake. o Activity as tolerated Electronic Signature(s) Signed: 10/21/2016 4:04:09 PM By: Regan Lemming BSN, RN Signed: 10/21/2016 5:37:12 PM By: Linton Ham MD Entered By: Regan Lemming on 10/21/2016 08:53:15 Salvo,  KAGE WILLMANN ([id number]) -------------------------------------------------------------------------------- Problem List Details Lee Bryant Date of Service: 10/21/2016 8:15 AM Patient Name: G. Patient Account Number: [id number] Medical Record Treating RN: Lee Gouty RN, BSN, [name] [id number] Number: Other Clinician: Date of Birth/Sex: 1956/10/09 (60 y.o. 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 Patient Account Number: [id number] Medical Record Treating RN: Lee Gouty RN, BSN, [name] [id number] Number: Other Clinician: Date of Birth/Sex: 07-Apr-1956 (60 y.o. Male) Treating  Linton Ham Primary Care Physician/Extender: Lee Bryant, Lee Bryant Physician: Referring Physician: Metta Bryant in Treatment: 2 Subjective Chief Complaint Information obtained from Patient 10/07/16; the patient is here for review of 2 wounds on the Bryant anterior lower leg and the Bryant medial ankle. History of Present Illness (HPI) 10/07/16; patient is a 61 year old male type II diabetic on oral agents. He was apparently cleaning out his pond and traumatized his Bryant anterior leg on since late roughly 3-4 months ago. 3 days later he noted an open area on the Bryant anterior leg. Exact course of this is not completely clear or resolve his primary physician on 12/15. A culture was done and according to the patient a culture was done and Cipro was prescribed at 500 mg twice a day. Do not have the exact culture results. Also his primary physician arranged arterial studies for after the holidays which seems reasonable. Within the last week or 2 secondary wound has come up on the Bryant medial malleoli area. Again the cause of this is not completely clear he does not complain of trauma. ABIs in this clinic was 0.9 bilaterally. He does not give a history of claudication. He has a history of a traumatic wound on the left anterior leg many years ago which healed. For the Cipro he appears to bring given a course of clindamycin. Recently his has been treating this with an ointment called Terrasil which we googled to find it is a combination of multiple products including silver, zinc oxide. They claim that that is done a lot for helping improve this wound. In reviewing his primary 70 notes occur [place] clinic from 12/15 he is listed as having type 2 diabetes without complication, essential hypertension, hypertriglyceridemia and neuralgia/neuritis unspecified 10/14/16; patient has 3 reasonably small punched out wounds on the Bryant leg. 2 on the anterior part of the calf and one on  the medial Bryant ankle. He had told me that his primary physician have arranged for arterial studies last week. As it turns out I think these were studies that were going to be done in his clinic. The patient would not allow him to take the dressing off. I would rather formal arterial studies in this setting in any case. ABIs in our clinic were 0.9 bilaterally. I would like to see arterial Dopplers as well as ABIs 10/21/16; the patient has 2 small punched out areas on the Bryant anterior lower  leg and one on the Bryant medial ankle. As it turns out he took his wrap off over the weekend because there was too much pain at night. We're using Profore lites ABIs in this clinic were 0.9 bilaterally. He has formal arterial studies this afternoon Lee Bryant, Lee G. ([id number]) Objective Constitutional Patient is hypertensive.. Pulse regular and within target range for patient.Marland Kitchen Respirations regular, non-labored and within target range.. Temperature is normal and within the target range for the patient.. Patient's appearance is neat and clean. Appears in no acute distress. Well nourished and well developed.. Vitals Time Taken: 8:17 AM, Height: 73 in, Weight: 273 lbs, BMI: 36, Temperature: 98.1 F, Pulse: 100 bpm, Respiratory Rate: 16 breaths/min, Blood Pressure: 170/100 mmHg. Eyes Conjunctivae clear. No discharge.Marland Kitchen Respiratory Respiratory effort is easy and symmetric bilaterally. Rate is normal at rest and on room air.. Cardiovascular Heart rhythm and rate regular, without murmur or gallop.. Femoral pulses are reduced. Rosales pedis pulses are reduced. Minimal edema. Signs of chronic venous since insufficiency. Lymphatic None palpable in the popliteal or inguinal area. Psychiatric No evidence of depression, anxiety, or agitation. Calm, cooperative, and communicative. Appropriate interactions and affect.. General Notes: Wound exam; the patient has 2 small wounds on the Bryant anterior leg and one on  the Bryant medial ankle below the Bryant medial malleolus. All of these have surface slough. No debridement was done today as the patient is going for his arterial studies this afternoon Integumentary (Hair, Skin) Wound #1 status is Open. Original cause of wound was Gradually Appeared. The wound is located on the Bryant,Anterior Lower Leg. The wound measures 3.5cm length x 1.5cm width x 0.3cm depth; 4.123cm^2 area and 1.237cm^3 volume. There is fat exposed. There is no tunneling or undermining noted. There is a medium amount of serosanguineous drainage noted. The wound margin is distinct with the outline attached to the wound base. There is no granulation within the wound bed. There is a large (67-100%) amount of necrotic tissue within the wound bed including Adherent Slough. The periwound skin appearance exhibited: Localized Edema, Scarring, Moist, Mottled. The periwound skin appearance did not exhibit: Callus, Crepitus, Excoriation, Fluctuance, Friable, Induration, Rash, Dry/Scaly, Maceration, Atrophie Blanche, Cyanosis, Ecchymosis, Hemosiderin Staining, Pallor, Rubor, Erythema. Periwound temperature was noted as No Abnormality. The periwound has tenderness on palpation. Wound #2 status is Open. Original cause of wound was Gradually Appeared. The wound is located on the Bryant,Medial Malleolus. The wound measures 0.5cm length x 1cm width x 0.1cm depth; 0.393cm^2 area Boatman, Tremar G. ([id number]) and 0.039cm^3 volume. The wound is limited to skin breakdown. There is no tunneling or undermining noted. There is a medium amount of serosanguineous drainage noted. The wound margin is distinct with the outline attached to the wound base. There is medium (34-66%) pink, pale granulation within the wound bed. There is a medium (34-66%) amount of necrotic tissue within the wound bed including Eschar. The periwound skin appearance exhibited: Localized Edema, Moist, Mottled. The periwound skin  appearance did not exhibit: Callus, Crepitus, Excoriation, Fluctuance, Friable, Induration, Rash, Scarring, Dry/Scaly, Maceration, Atrophie Blanche, Cyanosis, Ecchymosis, Hemosiderin Staining, Pallor, Rubor, Erythema. Periwound temperature was noted as No Abnormality. The periwound has tenderness on palpation. 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 Wound #2 Bryant,Medial Malleolus: Cleanse wound with mild soap and water May Shower, gently pat wound dry prior to applying new dressing. Anesthetic: Wound #1 Bryant,Anterior Lower Leg: Topical Lidocaine 4% cream applied to wound bed prior to debridement Wound #2 Bryant,Medial Malleolus: Topical Lidocaine 4% cream applied to wound bed prior to debridement Primary Wound Dressing: Wound #1 Bryant,Anterior Lower Leg: Santyl Ointment Wound #2 Bryant,Medial Malleolus: Santyl Ointment Secondary Dressing: Wound #1 Bryant,Anterior Lower Leg: Dry Gauze - 2x2 Wound #2 Bryant,Medial Malleolus: KRITHIK, MAPEL. ([id number]) Dry Gauze - 2x2 Dressing Change Frequency: Wound #1 Bryant,Anterior Lower Leg: Change dressing every week Wound #2 Bryant,Medial Malleolus: Change dressing every week Follow-up Appointments: Wound #1 Bryant,Anterior Lower Leg: Return Appointment in 1 week. Wound #2 Bryant,Medial Malleolus: Return Appointment in 1 week. Edema Control: Wound #1 Bryant,Anterior Lower Leg: Patient to wear own Juxtalite/Juzo compression garment. - order/patient stated he has already  ordered himself one Wound #2 Bryant,Medial Malleolus: Patient to wear own Juxtalite/Juzo compression garment. - order/patient stated he has already ordered himself one Additional Orders / Instructions: Wound #1 Bryant,Anterior Lower Leg: Increase protein intake. Activity as tolerated Wound #2 Bryant,Medial Malleolus: Increase protein intake. Activity as tolerated #1 it doesn't appear that the patient actually had anything on the wound since last weekend. He found the Profore light wraps painful. It is possible that he has underlying significant arterial disease as his wounds actually look more arterial and venous. He is going for formal vascular/arterial studies this afternoon. I have written him a prescription for Santyl and asked him to use a thick Band-Aid to cover the wounds change daily. #2 he has purchased compression stockings online however I'm not exactly sure what he is purchased and exactly how much compression. We have asked them to bring them in next week. #3 I written a prescription for Santyl with a thick Band-Aid or foam-based cover he is to change this daily. We should have the arterial studies to review next week Electronic Signature(s) Signed: 10/21/2016 5:37:12 PM By: Linton Ham MD Entered By: Linton Ham on 10/21/2016 09:04:48 Lee Bryant ([id number]) -------------------------------------------------------------------------------- SuperBill Details Lee Bryant Date of Service: 10/21/2016 Patient Name: G. Patient Account Number: [id number] Medical Record Treating RN: Lee Gouty, RN, BSN, [name] [id number] Number: Other Clinician: Date of Birth/Sex: 04/23/1956 (60 y.o. 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 I87.311 Chronic venous hypertension (idiopathic) with ulcer of Bryant lower extremity Facility Procedures CPT4 Code: [id number] Description: 99213 - WOUND CARE VISIT-LEV 3 EST PT Modifier: Quantity: 1 Physician Procedures CPT4 Code Description: [id number] 73532 - WC PHYS LEVEL 3 - EST PT ICD-10 Description Diagnosis E11.622 Type 2 diabetes mellitus with other skin ulcer 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

## 2016-10-27 ENCOUNTER — Encounter: Payer: Self-pay | Admitting: Cardiovascular Disease

## 2016-10-27 ENCOUNTER — Ambulatory Visit (INDEPENDENT_AMBULATORY_CARE_PROVIDER_SITE_OTHER): Payer: BLUE CROSS/BLUE SHIELD | Admitting: Cardiovascular Disease

## 2016-10-27 VITALS — BP 166/90 | HR 94 | Ht 73.0 in | Wt 281.5 lb

## 2016-10-27 DIAGNOSIS — I10 Essential (primary) hypertension: Secondary | ICD-10-CM | POA: Diagnosis not present

## 2016-10-27 DIAGNOSIS — Z136 Encounter for screening for cardiovascular disorders: Secondary | ICD-10-CM

## 2016-10-27 DIAGNOSIS — E785 Hyperlipidemia, unspecified: Secondary | ICD-10-CM

## 2016-10-27 NOTE — Patient Instructions (Signed)
Medication Instructions: No change.   Labwork: None.   Procedures/Testing: None.   Follow-Up: As needed with Dr. Arida.   Any Additional Special Instructions Will Be Listed Below (If Applicable).     If you need a refill on your cardiac medications before your next appointment, please call your pharmacy.   

## 2016-10-27 NOTE — Progress Notes (Signed)
Cardiology Office Note   Date:  10/27/2016   ID:  Lee Bryant., DOB 1956/08/05, MRN [id number]  PCP:  Tracie Harrier, MD  Cardiologist:   Kathlyn Sacramento, MD   Chief Complaint  Patient presents with  . other     New Patient. Referred by Dr.Hande for wound care fu. Pt states he is doing well other than his wound. Reviewed meds with pt verbally.      History of Present Illness: Lee Bryant. is a 61 y.o. male who was referred by Dr. Dellia Nims for evaluation of possible peripheral gel disease given the slow healing of right leg wound. The patient has no prior cardiac history and no prior history of peripheral arterial disease. He has known history of type 2 diabetes, hypertension, hyperlipidemia and obesity. He is not a smoker. He does have family history of coronary artery disease. He injured his right shin on a rock about 5 months ago and developed an open wound in that area. It has not healed since then. His blood sugar was running high which might have contributed. He reports significant improvement in the wound over the last 2-3 weeks. He has no leg claudication. He underwent noninvasive vascular evaluation in our office which showed normal ABI on the right with triphasic waveforms throughout. Left ABI was borderline at 0.94. The patient has no cardiac symptoms. He denies any chest pain or significant dyspnea.    Past Medical History:  Diagnosis Date  . Diabetes mellitus ([place])    type II    History reviewed. No pertinent surgical history.   Current Outpatient Prescriptions  Medication Sig Dispense Refill  . amLODipine (NORVASC) 10 MG tablet Take 10 mg by mouth daily.    Marland Kitchen aspirin EC 81 MG tablet Take 81 mg by mouth daily.    . diclofenac sodium (VOLTAREN) 1 % GEL Apply topically daily as needed.    . fenofibrate (TRICOR) 48 MG tablet Take 48 mg by mouth daily.    Marland Kitchen gabapentin (NEURONTIN) 100 MG capsule Take 100 mg by mouth at bedtime.    Marland Kitchen glimepiride  (AMARYL) 4 MG tablet Take 4 mg by mouth 2 (two) times daily.    . hydrALAZINE (APRESOLINE) 25 MG tablet Take 25 mg by mouth daily.    Marland Kitchen HYDROcodone-acetaminophen (NORCO/VICODIN) 5-325 MG tablet Take 5-325 tablets by mouth daily as needed.    Marland Kitchen ibuprofen (ADVIL,MOTRIN) 800 MG tablet Take 800 mg by mouth daily as needed.    . metFORMIN (GLUCOPHAGE) 1000 MG tablet Take 1,000 mg by mouth 2 (two) times daily.    . valsartan-hydrochlorothiazide (DIOVAN-HCT) 320-12.5 MG tablet TAKE ONE (1) TABLET EACH DAY     No current facility-administered medications for this visit.     Allergies:   Cephalexin    Social History:  The patient  reports that he has quit smoking. He has never used smokeless tobacco. He reports that he drinks alcohol. He reports that he uses drugs, including Marijuana.   Family History:  The patient's family history includes Cancer in his father and mother; Diabetes in his brother; Heart attack in his father; Hypertension in his mother; Kidney failure in his brother; Stroke in his sister.    ROS:  Please see the history of present illness.   Otherwise, review of systems are positive for none.   All other systems are reviewed and negative.    PHYSICAL EXAM: VS:  BP (!) 166/90 (BP Location: Right Arm, Patient Position: Sitting, Cuff Size:  Large)   Pulse 94   Ht 6\' 1"  (1.854 m)   Wt 281 lb 8 oz (127.7 kg)   BMI 37.14 kg/m  , BMI Body mass index is 37.14 kg/m. GEN: Well nourished, well developed, in no acute distress  HEENT: normal  Neck: no JVD, carotid bruits, or masses Cardiac: RRR; no murmurs, rubs, or gallops, mild leg edema  Respiratory:  clear to auscultation bilaterally, normal work of breathing GI: soft, nontender, nondistended, + BS MS: no deformity or atrophy  Skin: warm and dry, no rash Neuro:  Strength and sensation are intact Psych: euthymic mood, full affect Vascular: Femoral pulses are normal. Distal pulses are diminished but palpable with edema likely  interfering with the exam.   EKG:  EKG is ordered today. The ekg ordered today demonstrates normal sinus rhythm with borderline first-degree AV block, PACs and nonspecific T wave changes.   Recent Labs: No results found for requested labs within last 8760 hours.    Lipid Panel No results found for: CHOL, TRIG, HDL, CHOLHDL, VLDL, LDLCALC, LDLDIRECT    Wt Readings from Last 3 Encounters:  10/27/16 281 lb 8 oz (127.7 kg)       PAD Screen 10/27/2016  Previous PAD dx? No  Previous surgical procedure? No  Pain with walking? No  Feet/toe relief with dangling? No  Painful, non-healing ulcers? Yes  Extremities discolored? No      ASSESSMENT AND PLAN:  1.  Mild peripheral arterial disease: His PAD is overall mild and I do not think this is interfering with slowly healing of his right leg wounds. His ABI on the right was normal with triphasic waveforms. Duplex showed mild diffuse nonobstructive disease. The patient has no claudication . Continue wound care and optimal diabetes control. Recommend treatment of risk factors of atherosclerosis.  2. Essential hypertension: Blood pressure is mildly elevated today continue to monitor and consider increasing hydrochlorothiazide.  3. Hyperlipidemia: He is currently on TriCor and fish oil. Treatment with a statin should be considered based on his lipid profile given that he is diabetic and has mild peripheral arterial disease.    Disposition:   FU with me as needed  Signed,  Kathlyn Sacramento, MD  10/27/2016 2:26 PM    [place]

## 2016-10-28 ENCOUNTER — Encounter: Payer: BLUE CROSS/BLUE SHIELD | Admitting: Internal Medicine

## 2016-10-28 DIAGNOSIS — E11622 Type 2 diabetes mellitus with other skin ulcer: Secondary | ICD-10-CM | POA: Diagnosis not present

## 2016-10-29 NOTE — Progress Notes (Addendum)
Lee, Bryant ([id number]) Visit Report for 10/28/2016 Arrival Information Details Lee, Bryant Date of Service: 10/28/2016 10:15 AM Patient Name: G. Patient Account Number: [id number] Medical Record Treating RN: Baruch Gouty, RN, BSN, [name] [id number] Number: Other Clinician: Date of Birth/Sex: June 22, 1956 (61 y.o. Male) Treating Linton Ham Primary Care Physician: Tracie Harrier Physician/Extender: G Referring Physician: Metta Clines in Treatment: 3 Visit Information History Since Last Visit All ordered tests and consults were completed: No Patient Arrived: Ambulatory Added or deleted any medications: No Arrival Time: 10:18 Any new allergies or adverse reactions: No Accompanied By: SELF Had a fall or experienced change in No Transfer Assistance: None activities of daily living that may affect Patient Identification Verified: Yes risk of falls: Secondary Verification Process Yes Signs or symptoms of abuse/neglect since last No Completed: visito Patient Requires Transmission- No Hospitalized since last visit: No Based Precautions: Has Dressing in Place as Prescribed: Yes Patient Has Alerts: Yes Has Compression in Place as Prescribed: Yes Patient Alerts: Patient on Blood Pain Present Now: No Thinner Type II Diabetic 81mg  Aspirin Electronic Signature(s) Signed: 10/28/2016 4:54:43 PM By: Regan Lemming BSN, RN Entered By: Regan Lemming on 10/28/2016 10:18:38 [name] ([id number]) -------------------------------------------------------------------------------- Encounter Discharge Information Details Lee Bryant Date of Service: 10/28/2016 10:15 AM Patient Name: G. Patient Account Number: [id number] Medical Record Treating RN: Baruch Gouty, RN, BSN, [name] [id number] Number: Other Clinician: Date of Birth/Sex: 1956/09/29 (61 y.o. Male) Treating Linton Ham Primary Care Physician: Tracie Harrier Physician/Extender: G Referring Physician: Metta Clines in Treatment: 3 Encounter Discharge Information Items Discharge Pain Level: 0 Discharge Condition: Stable Ambulatory Status: Ambulatory Discharge Destination: Home Transportation: Private Auto Accompanied By: self Schedule Follow-up Appointment: No Medication Reconciliation completed and provided to Patient/Care No Crystalyn Delia: Provided on Clinical Summary of Care: 10/28/2016 Form Type Recipient Paper Patient EM Electronic Signature(s) Signed: 10/28/2016 11:05:11 AM By: Ruthine Dose Entered By: Ruthine Dose on 10/28/2016 11:05:10 [name] ([id number]) -------------------------------------------------------------------------------- Lower Extremity Assessment Details Lee Bryant Date of Service: 10/28/2016 10:15 AM Patient Name: G. Patient Account Number: [id number] Medical Record Treating RN: Baruch Gouty, RN, BSN, [name] [id number] Number: Other Clinician: Date of Birth/Sex: Aug 15, 1956 (60 y.o. Male) Treating Linton Ham Primary Care Physician: Tracie Harrier Physician/Extender: G Referring Physician: Tracie Harrier Weeks in Treatment: 3 Edema Assessment Assessed: [Left: No] [Right: No] E[Left: dema] [Right: :] Calf Left: Right: Point of Measurement: 37 cm From Medial Instep cm 40.1 cm Ankle Left: Right: Point of Measurement: 9 cm From Medial Instep cm 26.1 cm Vascular Assessment Claudication: Claudication Assessment [Right:None] Pulses: Dorsalis Pedis Palpable: [Right:Yes] Posterior Tibial Extremity colors, hair growth, and conditions: Extremity Color: [Right:Mottled] Hair Growth on Extremity: [Right:Yes] Temperature of Extremity: [Right:Warm] Capillary Refill: [Right:< 3 seconds] Electronic Signature(s) Signed: 10/28/2016 4:54:43 PM By: Regan Lemming BSN, RN Entered By: Regan Lemming on 10/28/2016 10:21:42 [name] ([id number]) -------------------------------------------------------------------------------- Multi Wound  Chart Details Lee Bryant Date of Service: 10/28/2016 10:15 AM Patient Name: G. Patient Account Number: [id number] Medical Record Treating RN: Baruch Gouty, RN, BSN, [name] [id number] Number: Other Clinician: Date of Birth/Sex: [birth year] (60 y.o. 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] [2:the Lower Extremity] [N/A:N/A] Comorbid History: [1:Hypertension, Type II Diabetes] [2:Hypertension, Type II Diabetes] [N/A:N/A] Date Acquired: [1:08/17/2016] [2:08/17/2016] [N/A:N/A] Weeks of Treatment: [1:3] [2:3] [N/A:N/A] Wound Status: [1:Open] [2:Open] [N/A:N/A] Measurements L x W x D 3.4x1.3x0.3 [2:0.5x1x0.2] [N/A:N/A] (cm) Area (cm) : [1:3.471] [2:0.393] [N/A:N/A] Volume (cm) : [1:1.041] [2:0.079] [N/A:N/A] % Reduction in Area: [1:1.80%] [2:-100.50%] [N/A:N/A] % Reduction in Volume: 26.40% [2:-295.00%] [N/A:N/A] Classification: [1:Grade 2] [2:Grade 1] [N/A:N/A] Exudate Amount: [1:Medium] [2:Medium] [N/A:N/A] Exudate Type: [1:Serosanguineous] [2:Serosanguineous] [N/A:N/A] Exudate Color: [1:red, brown] [2:red, brown] [N/A:N/A] Wound Margin: [1:Distinct, outline attached Distinct, outline attached] [N/A:N/A] Granulation Amount: [1:None Present (0%)] [2:Medium (34-66%)] [N/A:N/A] Granulation Quality: [1:N/A] [2:Pink, Pale] [N/A:N/A] Necrotic Amount: [1:Large (67-100%)] [2:Medium (34-66%)] [N/A:N/A] Necrotic Tissue:  [1:Adherent Slough] [2:Eschar] [N/A:N/A] Exposed Structures: [1:Fat: Yes Fascia: No Tendon: No] [2:Fascia: No Fat: No Tendon: No] [N/A:N/A] Muscle: No Muscle: No Joint: No Joint: No Bone: No Bone: No Limited to Skin Breakdown Epithelialization: None None N/A Debridement: Debridement (25366- N/A N/A 11047) Pre-procedure 10:47 N/A N/A Verification/Time Out Taken: Pain Control: Lidocaine 4% Topical N/A N/A Solution Tissue Debrided: Fibrin/Slough, N/A N/A Subcutaneous Level: Skin/Subcutaneous N/A N/A Tissue Debridement Area (sq 4.42 N/A N/A cm): Instrument: Curette N/A N/A Bleeding: Minimum N/A N/A Hemostasis Achieved: Pressure N/A N/A Procedural Pain: 0 N/A N/A Post Procedural Pain: 0 N/A N/A Debridement Treatment Procedure was tolerated N/A N/A Response: well Post Debridement 3.4x1.3x0.3 N/A N/A Measurements L x W x D (cm) Post Debridement 1.041 N/A N/A Volume: (cm) Periwound Skin Texture: Edema: Yes Edema: Yes N/A Scarring: Yes Excoriation: No Excoriation: No Induration: No Induration: No Callus: No Callus: No Crepitus: No Crepitus: No Fluctuance: No Fluctuance: No Friable: No Friable: No Rash: No Rash: No Scarring: No Periwound Skin Moist: Yes Moist: Yes N/A Moisture: Maceration: No Maceration: No Dry/Scaly: No Dry/Scaly: No Periwound Skin Color: Mottled: Yes Mottled: Yes N/A Atrophie Blanche: No Atrophie Blanche: No Cyanosis: No Cyanosis: No Ecchymosis: No Ecchymosis: No Erythema: No Erythema: No Hemosiderin Staining: No Hemosiderin Staining: No Salsbury, Janoah G. ([id number]) Pallor: No Pallor: No Rubor: No Rubor: No Temperature: No Abnormality No Abnormality N/A Tenderness on Yes Yes N/A Palpation: Wound Preparation: Ulcer Cleansing: Ulcer Cleansing: N/A Rinsed/Irrigated with Rinsed/Irrigated with Saline Saline, Other: surg scrub and water Topical Anesthetic Applied: Other: Lidocaine Topical Anesthetic 4% Applied: Other:  Lidocaine 4% Procedures Performed: Debridement N/A N/A Treatment Notes Wound #1 (Right, Anterior Lower Leg) 4. Dressing Applied: Santyl Ointment 5. Secondary Dressing Applied ABD Pad 7. Secured with 3 Layer Compression System - Right Lower Extremity Wound #2 (Right, Medial Malleolus) 4. Dressing Applied: Santyl Ointment 5. Secondary Dressing Applied ABD Pad 7. Secured with 3 Layer Compression System - Right Lower Extremity Electronic Signature(s) Signed: 10/28/2016 6:05:53 PM By: Linton Ham MD Entered By: Linton Ham on 10/28/2016 12:25:31 [name] ([id number]) -------------------------------------------------------------------------------- Multi-Disciplinary Care Plan Details Lee Bryant Date of Service: 10/28/2016 10:15 AM Patient Name: G. Patient Account Number: [id number] Medical Record Treating RN: Baruch Gouty RN, BSN, [name] [id number] Number: Other Clinician: Date of Birth/Sex: [birth year] (60 y.o. Male) Treating Linton Ham Primary Care Robert Sperl: Tracie Harrier Sharon Rubis/Extender: G Referring Mackie Goon: Metta Clines in Treatment: 3 Active Inactive Electronic Signature(s) Signed: 11/18/2016 1:35:07 PM By: Gretta Cool RN, BSN, Kim RN, BSN Signed: 02/16/2017 4:23:42 PM By: Regan Lemming BSN, RN Previous Signature: 10/28/2016 4:54:43 PM Version By: Regan Lemming BSN, RN Entered By: Gretta Cool, RN, BSN, Kim on 11/18/2016 13:35:07 [name] ([id number]) -------------------------------------------------------------------------------- Pain Assessment Details Lee Bryant Date of Service: 10/28/2016 10:15 AM Patient Name: G.  Patient Account Number: [id number] Medical Record Treating RN: Baruch Gouty, RN, BSN, [name] [id number] Number: Other Clinician: Date of Birth/Sex: Nov 22, 1955 (61 y.o. Male) Treating ROBSON, [place] Primary Care Physician: Tracie Harrier Physician/Extender: G Referring Physician: Metta Clines in Treatment: 3 Active  Problems Location of Pain Severity and Description of Pain Patient Has Paino No Site Locations With Dressing Change: No Pain Management and Medication Current Pain Management: Electronic Signature(s) Signed: 10/28/2016 4:54:43 PM By: Regan Lemming BSN, RN Entered By: Regan Lemming on 10/28/2016 10:18:46 [name] ([id number]) -------------------------------------------------------------------------------- Patient/Caregiver Education Details Lee Bryant Date of Service: 10/28/2016 10:15 AM Patient Name: G. Patient Account Number: [id number] Medical Record Treating RN: Baruch Gouty, RN, BSN, [name] [id number] Number: Other Clinician: Date of Birth/Gender: 1956-10-06 (60 y.o. Male) Treating ROBSON, [place] Primary Care Physician: Tracie Harrier Physician/Extender: G Referring Physician: Metta Clines in Treatment: 3 Education Assessment Education Provided To: Patient Education Topics Provided Welcome To [facility]: Methods: Explain/Verbal Responses: State content correctly Wound/Skin Impairment: Methods: Explain/Verbal Responses: State content correctly Electronic Signature(s) Signed: 10/28/2016 4:54:43 PM By: Regan Lemming BSN, RN Entered By: Regan Lemming on 10/28/2016 10:52:00 [name] ([id number]) -------------------------------------------------------------------------------- Wound Assessment Details Lee Bryant Date of Service: 10/28/2016 10:15 AM Patient Name: G. Patient Account Number: [id number] Medical Record Treating RN: Baruch Gouty, RN, BSN, [name] [id number] Number: Other Clinician: Date of Birth/Sex: 1955-11-15 (61 y.o. Male) Treating ROBSON, [place] Primary Care Physician: Tracie Harrier Physician/Extender: G Referring Physician: Tracie Harrier Weeks in Treatment: 3 Wound Status Wound Number: 1 Primary Diabetic Wound/Ulcer of the Lower Etiology: Extremity Wound Location: Right Lower Leg - Anterior Wound Status:  Open Wounding Event: Gradually Appeared Comorbid Hypertension, Type II Diabetes Date Acquired: 08/17/2016 History: Weeks Of Treatment: 3 Clustered Wound: No Photos Photo Uploaded By: Regan Lemming on 10/28/2016 17:12:23 Wound Measurements Length: (cm) 3.4 Width: (cm) 1.3 Depth: (cm) 0.3 Area: (cm) 3.471 Volume: (cm) 1.041 % Reduction in Area: 1.8% % Reduction in Volume: 26.4% Epithelialization: None Tunneling: No Undermining: No Wound Description Classification: Grade 2 Wound Margin: Distinct, outline attached Exudate Amount: Medium Exudate Type: Serosanguineous Exudate Color: red, brown Foul Odor After Cleansing: No Wound Bed Granulation Amount: None Present (0%) Exposed Structure Necrotic Amount: Large (67-100%) Fascia Exposed: No Necrotic Quality: Adherent Slough Fat Layer Exposed: Yes Valverde, Airon G. ([id number]) Tendon Exposed: No Muscle Exposed: No Joint Exposed: No Bone Exposed: No Periwound Skin Texture Texture Color No Abnormalities Noted: No No Abnormalities Noted: No Callus: No Atrophie Blanche: No Crepitus: No Cyanosis: No Excoriation: No Ecchymosis: No Fluctuance: No Erythema: No Friable: No Hemosiderin Staining: No Induration: No Mottled: Yes Localized Edema: Yes Pallor: No Rash: No Rubor: No Scarring: Yes Temperature / Pain Moisture Temperature: No Abnormality No Abnormalities Noted: No Tenderness on Palpation: Yes Dry / Scaly: No Maceration: No Moist: Yes Wound Preparation Ulcer Cleansing: Rinsed/Irrigated with Saline Topical Anesthetic Applied: Other: Lidocaine 4%, Electronic Signature(s) Signed: 10/28/2016 4:54:43 PM By: Regan Lemming BSN, RN Entered By: Regan Lemming on 10/28/2016 10:29:09 [name] ([id number]) -------------------------------------------------------------------------------- Wound Assessment Details Lee Bryant Date of Service: 10/28/2016 10:15 AM Patient Name: G. Patient Account Number:  [id number] Medical Record Treating RN: Baruch Gouty RN, BSN, [name] [id number] Number: Other Clinician: Date of Birth/Sex: [birth year] (60 y.o. Male) Treating Linton Ham Primary Care Physician: Tracie Harrier Physician/Extender: G Referring Physician: Tracie Harrier Weeks in Treatment: 3 Wound Status Wound Number: 2 Primary Diabetic Wound/Ulcer of the Lower Etiology: Extremity Wound Location: Right Malleolus - Medial Wound Status: Open Wounding Event: Gradually Appeared Comorbid Hypertension, Type II  Diabetes Date Acquired: 08/17/2016 History: Weeks Of Treatment: 3 Clustered Wound: No Photos Photo Uploaded By: Regan Lemming on 10/28/2016 17:12:24 Wound Measurements Length: (cm) 0.5 Width: (cm) 1 Depth: (cm) 0.2 Area: (cm) 0.393 Volume: (cm) 0.079 % Reduction in Area: -100.5% % Reduction in Volume: -295% Epithelialization: None Tunneling: No Undermining: No Wound Description Classification: Grade 1 Foul Odor Aft Wound Margin: Distinct, outline attached Exudate Amount: Medium QUANTE, PETTRY ([id number]) er Cleansing: No Exudate Type: Serosanguineous Exudate Color: red, brown Wound Bed Granulation Amount: Medium (34-66%) Exposed Structure Granulation Quality: Pink, Pale Fascia Exposed: No Necrotic Amount: Medium (34-66%) Fat Layer Exposed: No Necrotic Quality: Eschar Tendon Exposed: No Muscle Exposed: No Joint Exposed: No Bone Exposed: No Limited to Skin Breakdown Periwound Skin Texture Texture Color No Abnormalities Noted: No No Abnormalities Noted: No Callus: No Atrophie Blanche: No Crepitus: No Cyanosis: No Excoriation: No Ecchymosis: No Fluctuance: No Erythema: No Friable: No Hemosiderin Staining: No Induration: No Mottled: Yes Localized Edema: Yes Pallor: No Rash: No Rubor: No Scarring: No Temperature / Pain Moisture Temperature: No Abnormality No Abnormalities Noted: No Tenderness on Palpation: Yes Dry / Scaly: No Maceration:  No Moist: Yes Wound Preparation Ulcer Cleansing: Rinsed/Irrigated with Saline, Other: surg scrub and water, Topical Anesthetic Applied: Other: Lidocaine 4%, Electronic Signature(s) Signed: 10/28/2016 4:54:43 PM By: Regan Lemming BSN, RN Entered By: Regan Lemming on 10/28/2016 10:29:18 [name] ([id number]) -------------------------------------------------------------------------------- Vitals Details Lee Bryant Date of Service: 10/28/2016 10:15 AM Patient Name: G. Patient Account Number: [id number] Medical Record Treating RN: Baruch Gouty, RN, BSN, [name] [id number] Number: Other Clinician: Date of Birth/Sex: 01-31-1956 (60 y.o. Male) Treating ROBSON, [place] Primary Care Physician: Tracie Harrier Physician/Extender: G Referring Physician: Tracie Harrier Weeks in Treatment: 3 Vital Signs Time Taken: 10:22 Temperature (F): 98.1 Height (in): 73 Pulse (bpm): 94 Weight (lbs): 273 Respiratory Rate (breaths/min): 16 Body Mass Index (BMI): 36 Blood Pressure (mmHg): 156/100 Reference Range: 80 - 120 mg / dl Electronic Signature(s) Signed: 10/28/2016 4:54:43 PM By: Regan Lemming BSN, RN Entered By: Regan Lemming on 10/28/2016 10:22:39

## 2016-10-29 NOTE — Progress Notes (Signed)
ESAUL, DORWART ([id number]) Visit Report for 10/28/2016 Chief Complaint Document Details HARRISON, ZETINA Date of Service: 10/28/2016 10:15 AM Patient Name: G. Patient Account Number: [id number] Medical Record Treating RN: Baruch Gouty RN, BSN, [name] [id number] Number: Other Clinician: Date of Birth/Sex: 07-14-1956 (60 y.o. Male) Treating Linton Ham Primary Care Physician/Extender: Katherina Right, Vishwanath Physician: Referring Physician: Metta Clines in Treatment: 3 Information Obtained from: Patient Chief Complaint 10/07/16; the patient is here for review of 2 wounds on the right anterior lower leg and the right medial ankle. Electronic Signature(s) Signed: 10/28/2016 6:05:53 PM By: Linton Ham MD Entered By: Linton Ham on 10/28/2016 12:26:55 [name] ([id number]) -------------------------------------------------------------------------------- Debridement Details Lee Bryant Date of Service: 10/28/2016 10:15 AM Patient Name: G. Patient Account Number: [id number] Medical Record Treating RN: Baruch Gouty, RN, BSN, [name] [id number] Number: Other Clinician: Date of Birth/Sex: 07/24/1956 (60 y.o. Male) Treating Brock Mokry Primary Care Physician/Extender: Katherina Right, Vishwanath Physician: Referring Physician: Metta Clines in Treatment: 3 Debridement Performed for Wound #1 Right,Anterior Lower Leg Assessment: Performed By: Physician Ricard Dillon, MD Debridement: Debridement Pre-procedure Yes - 10:47 Verification/Time Out Taken: Start Time: 10:47 Pain Control: Lidocaine 4% Topical Solution Level: Skin/Subcutaneous Tissue Total Area Debrided (L x 3.4 (cm) x 1.3 (cm) = 4.42 (cm) W): Tissue and other Non-Viable, Fibrin/Slough, Subcutaneous material debrided: Instrument: Curette Bleeding: Minimum Hemostasis Achieved: Pressure End Time: 10:50 Procedural Pain: 0 Post Procedural Pain: 0 Response to Treatment: Procedure was tolerated  well Post Debridement Measurements of Total Wound Length: (cm) 3.4 Width: (cm) 1.3 Depth: (cm) 0.3 Volume: (cm) 1.041 Character of Wound/Ulcer Post Requires Further Debridement Debridement: Severity of Tissue Post Debridement: Fat layer exposed Post Procedure Diagnosis Same as Pre-procedure Electronic Signature(s) Signed: 10/28/2016 4:54:43 PM By: Regan Lemming BSN, RN [name] ([id number]) Signed: 10/28/2016 6:05:53 PM By: Linton Ham MD Entered By: Linton Ham on 10/28/2016 12:26:46 [name] ([id number]) -------------------------------------------------------------------------------- HPI Details Lee Bryant Date of Service: 10/28/2016 10:15 AM Patient Name: G. Patient Account Number: [id number] Medical Record Treating RN: Baruch Gouty RN, BSN, [name] [id number] Number: Other Clinician: Date of Birth/Sex: 10-03-1956 (60 y.o. Male) Treating Linton Ham Primary Care Physician/Extender: Katherina Right, Vishwanath Physician: Referring Physician: Metta Clines in Treatment: 3 History of Present Illness HPI Description: 10/07/16; patient is a 61 year old male type II diabetic on oral agents. He was apparently cleaning out his pond and traumatized his right anterior leg on since late roughly 3-4 months ago. 3 days later he noted an open area on the right anterior leg. Exact course of this is not completely clear or resolve his primary physician on 12/15. A culture was done and according to the patient a culture was done and Cipro was prescribed at 500 mg twice a day. Do not have the exact culture results. Also his primary physician arranged arterial studies for after the holidays which seems reasonable. Within the last week or 2 secondary wound has come up on the right medial malleoli area. Again the cause of this is not completely clear he does not complain of trauma. ABIs in this clinic was 0.9 bilaterally. He does not give a history of claudication.  He has a history of a traumatic wound on the left anterior leg many years ago which healed. For the Cipro he appears to bring given a course of clindamycin. Recently his has been treating this with an ointment called Terrasil which we googled to find it is a combination of multiple products including silver, zinc oxide. They claim that that is  done a lot for helping improve this wound. In reviewing his primary 13 notes occur [name] from 12/15 he is listed as having type 2 diabetes without complication, essential hypertension, hypertriglyceridemia and neuralgia/neuritis unspecified 10/14/16; patient has 3 reasonably small punched out wounds on the right leg. 2 on the anterior part of the calf and one on the medial right ankle. He had told me that his primary physician have arranged for arterial studies last week. As it turns out I think these were studies that were going to be done in his clinic. The patient would not allow him to take the dressing off. I would rather formal arterial studies in this setting in any case. ABIs in our clinic were 0.9 bilaterally. I would like to see arterial Dopplers as well as ABIs 10/21/16; the patient has 2 small punched out areas on the right anterior lower leg and one on the right medial ankle. As it turns out he took his wrap off over the weekend because there was too much pain at night. We're using Profore lites ABIs in this clinic were 0.9 bilaterally. He has formal arterial studies this afternoon 10/28/16; the patient has 2 small punched out areas on the right anterior lower leg. Both of these require debridement of tightly adherent surface slough and nonviable subcutaneous tissue. There is also a small wound on the right medial ankle in a similar state. Surprisingly the patient's arterial studies are really quite good with ABIs on the right of 1 and on the left at 0.94. The TBI on the right is 1.01 and on the left at 1.06 waveforms are triphasic  bilaterally. Her was elevated velocities in the right SFA and the left CFA and PFA but 3 vessel runoff bilaterally. This does not suggest significant arterial insufficiency as causation and these wounds although they certainly look arterial rather than venous. Electronic Signature(s) Signed: 10/28/2016 6:05:53 PM By: Linton Ham MD [name] ([id number]) Entered By: Linton Ham on 10/28/2016 12:28:54 [name] ([id number]) -------------------------------------------------------------------------------- Physical Exam Details Lee Bryant Date of Service: 10/28/2016 10:15 AM Patient Name: G. Patient Account Number: [id number] Medical Record Treating RN: Baruch Gouty RN, BSN, [name] [id number] Number: Other Clinician: Date of Birth/Sex: [birth year] (60 y.o. Male) Treating Abrial Arrighi Primary Care Physician/Extender: Katherina Right, Vishwanath Physician: Referring Physician: Metta Clines in Treatment: 3 Constitutional Patient is hypertensive.. Pulse regular and within target range for patient.Marland Kitchen Respirations regular, non-labored and within target range.. Temperature is normal and within the target range for the patient.. Patient's appearance is neat and clean. Appears in no acute distress. Well nourished and well developed.. Cardiovascular Edema present in both extremities. Changes of chronic venous insufficiency.. Notes Wound exam; the patient's 2 small areas on the right anterior leg required debridement with a #3 curet he tolerates this well. Hemostasis with direct pressure. On the right medial ankle debridement also. No bleeding was encountered. There is no evidence of infection Electronic Signature(s) Signed: 10/28/2016 6:05:53 PM By: Linton Ham MD Entered By: Linton Ham on 10/28/2016 12:30:15 [name] ([id number]) -------------------------------------------------------------------------------- Physician Orders Details Lee Bryant Date of Service: 10/28/2016 10:15 AM Patient Name: G. Patient Account Number: [id number] Medical Record Treating RN: Baruch Gouty RN, BSN, [name] [id number] Number: Other Clinician: Date of Birth/Sex: 1956/05/10 (60 y.o. Male) Treating Calahan Pak Primary Care Physician/Extender: Katherina Right, Vishwanath Physician: Referring Physician: Metta Clines in Treatment: 3 Verbal / Phone Orders: Yes Clinician: Afful, RN, BSN, Rita Read Back and Verified: Yes Diagnosis Coding Wound Cleansing Wound #  1 Right,Anterior Lower Leg o Cleanse wound with mild soap and water o May Shower, gently pat wound dry prior to applying new dressing. Wound #2 Right,Medial Malleolus o Cleanse wound with mild soap and water o May Shower, gently pat wound dry prior to applying new dressing. Anesthetic Wound #1 Right,Anterior Lower Leg o Topical Lidocaine 4% cream applied to wound bed prior to debridement Wound #2 Right,Medial Malleolus o Topical Lidocaine 4% cream applied to wound bed prior to debridement Primary Wound Dressing Wound #1 Right,Anterior Lower Leg o Santyl Ointment Wound #2 Right,Medial Malleolus o Santyl Ointment Secondary Dressing Wound #1 Right,Anterior Lower Leg o ABD pad Wound #2 Right,Medial Malleolus o Dry Gauze Dressing Change Frequency Wound #1 Right,Anterior Lower Leg o Change dressing every week Peaden, Lee G. ([id number]) Wound #2 Right,Medial Malleolus o Change dressing every week Follow-up Appointments Wound #1 Right,Anterior Lower Leg o Return Appointment in 1 week. Wound #2 Right,Medial Malleolus o Return Appointment in 1 week. Edema Control Wound #1 Right,Anterior Lower Leg o 3 Layer Compression System - Right Lower Extremity o Elevate legs to the level of the heart and pump ankles as often as possible Wound #2 Right,Medial Malleolus o 3 Layer Compression System - Right Lower Extremity Additional Orders /  Instructions Wound #1 Right,Anterior Lower Leg o Increase protein intake. o Activity as tolerated Wound #2 Right,Medial Malleolus o Increase protein intake. o Activity as tolerated Electronic Signature(s) Signed: 10/28/2016 4:54:43 PM By: Regan Lemming BSN, RN Signed: 10/28/2016 6:05:53 PM By: Linton Ham MD Entered By: Regan Lemming on 10/28/2016 10:49:47 [name] ([id number]) -------------------------------------------------------------------------------- Problem List Details Lee Bryant Date of Service: 10/28/2016 10:15 AM Patient Name: G. Patient Account Number: [id number] Medical Record Treating RN: Baruch Gouty RN, BSN, [name] [id number] Number: Other Clinician: Date of Birth/Sex: 1955/11/04 (60 y.o. 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 Patient Account Number: [id number] Medical Record Treating RN: Baruch Gouty RN, BSN, [name] [id number] Number: Other Clinician: Date of Birth/Sex: 10-09-1956 (60 y.o. Male) Treating Linton Ham Primary Care  Physician/Extender: Katherina Right, Vishwanath Physician: Referring Physician: Metta Clines in Treatment: 3 Subjective Chief Complaint Information obtained from Patient 10/07/16; the patient is here for review of 2 wounds on the right anterior lower leg and the right medial ankle. History of Present Illness (HPI) 10/07/16; patient is a 61 year old male type II diabetic on oral agents. He was apparently cleaning out his pond and traumatized his right anterior leg on since late roughly 3-4 months ago. 3 days later he noted an open area on the right anterior leg. Exact course of this is not completely clear or resolve his primary physician on 12/15. A culture was done and according to the patient a culture was done and Cipro was prescribed at 500 mg twice a day. Do not have the exact culture results. Also his primary physician arranged arterial studies  for after the holidays which seems reasonable. Within the last week or 2 secondary wound has come up on the right medial malleoli area. Again the cause of this is not completely clear he does not complain of trauma. ABIs in this clinic was 0.9 bilaterally. He does not give a history of claudication. He has a history of a traumatic wound on the left anterior leg many years ago which healed. For the Cipro he appears to bring given a course of clindamycin. Recently his has been treating this with an ointment called Terrasil which we googled to find it is a combination of multiple products including silver, zinc oxide. They claim that that is done a lot for helping improve this wound. In reviewing his primary 29 notes occur [place] clinic from 12/15 he is listed as having type 2 diabetes without complication, essential hypertension, hypertriglyceridemia and neuralgia/neuritis unspecified 10/14/16; patient has 3 reasonably small punched out wounds on the right leg. 2 on the anterior part of the calf and one on the medial right ankle. He  had told me that his primary physician have arranged for arterial studies last week. As it turns out I think these were studies that were going to be done in his clinic. The patient would not allow him to take the dressing off. I would rather formal arterial studies in this setting in any case. ABIs in our clinic were 0.9 bilaterally. I would like to see arterial Dopplers as well as ABIs 10/21/16; the patient has 2 small punched out areas on the right anterior lower leg and one on the right medial ankle. As it turns out he took his wrap off over the weekend because there was too much pain at night. We're using Profore lites ABIs in this clinic were 0.9 bilaterally. He has formal arterial studies this afternoon 10/28/16; the patient has 2 small punched out areas on the right anterior lower leg. Both of these require debridement of tightly adherent surface slough and nonviable subcutaneous tissue. There is also a small wound on the right medial ankle in a similar state. Surprisingly the patient's arterial studies are really quite good with ABIs on the right of 1 and on the left at 0.94. The TBI on the right is 1.01 and on the left at 1.06 [facility], Lee G. ([id number]) waveforms are triphasic bilaterally. Her was elevated velocities in the right SFA and the left CFA and PFA but 3 vessel runoff bilaterally. This does not suggest significant arterial insufficiency as causation and these wounds although they certainly look arterial rather than venous. Objective Constitutional Patient is hypertensive.. Pulse regular and within target range for patient.Marland Kitchen Respirations regular, non-labored and within target range.. Temperature is normal and within the target range for the patient.. Patient's appearance is neat and clean. Appears in no acute distress. Well nourished and well developed.. Vitals Time Taken: 10:22 AM, Height: 73 in, Weight: 273 lbs, BMI: 36, Temperature: 98.1 F, Pulse: 94 bpm, Respiratory  Rate: 16 breaths/min, Blood Pressure: 156/100 mmHg. Cardiovascular Edema present in both extremities. Changes of chronic venous insufficiency.. General Notes: Wound exam; the patient's 2 small areas on the right anterior leg required debridement with a #3 curet he tolerates this well. Hemostasis with direct pressure. On the right medial ankle debridement also. No bleeding was encountered. There is no evidence of infection Integumentary (Hair, Skin) Wound #1 status is Open. Original cause of wound was Gradually Appeared. The wound is located on the Right,Anterior Lower Leg. The wound measures 3.4cm length  x 1.3cm width x 0.3cm depth; 3.471cm^2 area and 1.041cm^3 volume. There is fat exposed. There is no tunneling or undermining noted. There is a medium amount of serosanguineous drainage noted. The wound margin is distinct with the outline attached to the wound base. There is no granulation within the wound bed. There is a large (67-100%) amount of necrotic tissue within the wound bed including Adherent Slough. The periwound skin appearance exhibited: Localized Edema, Scarring, Moist, Mottled. The periwound skin appearance did not exhibit: Callus, Crepitus, Excoriation, Fluctuance, Friable, Induration, Rash, Dry/Scaly, Maceration, Atrophie Blanche, Cyanosis, Ecchymosis, Hemosiderin Staining, Pallor, Rubor, Erythema. Periwound temperature was noted as No Abnormality. The periwound has tenderness on palpation. Wound #2 status is Open. Original cause of wound was Gradually Appeared. The wound is located on the Right,Medial Malleolus. The wound measures 0.5cm length x 1cm width x 0.2cm depth; 0.393cm^2 area and 0.079cm^3 volume. The wound is limited to skin breakdown. There is no tunneling or undermining noted. There is a medium amount of serosanguineous drainage noted. The wound margin is distinct with the outline attached to the wound base. There is medium (34-66%) pink, pale granulation within the  wound bed. There is a medium (34-66%) amount of necrotic tissue within the wound bed including Eschar. The periwound skin appearance exhibited: Localized Edema, Moist, Mottled. The periwound skin appearance did not exhibit: Callus, Crepitus, Excoriation, Fluctuance, Friable, Induration, Rash, Scarring, Dry/Scaly, Maceration, Atrophie Blanche, Cyanosis, Ecchymosis, Hemosiderin Staining, Pallor, Rubor, Erythema. Lee Bryant ([id number]) Periwound temperature was noted as No Abnormality. The periwound has tenderness on palpation. 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 There was a Skin/Subcutaneous Tissue Debridement ([id number]) debridement with total area of 4.42 sq cm performed by Ricard Dillon, MD. with the following instrument(s): Curette to remove Non- Viable tissue/material including Fibrin/Slough and Subcutaneous after achieving pain control using Lidocaine 4% Topical Solution. A time out was conducted at 10:47, prior to the start of the procedure. A Minimum amount of bleeding was controlled with Pressure. The procedure was tolerated well with a pain level of 0 throughout and a pain level of 0 following the procedure. Post Debridement Measurements: 3.4cm length x 1.3cm width x 0.3cm depth; 1.041cm^3 volume. Character of Wound/Ulcer Post Debridement requires further debridement. Severity of Tissue Post Debridement is: Fat layer exposed. Post procedure Diagnosis Wound #1: Same as Pre-Procedure Plan Wound Cleansing: Wound #1 Right,Anterior Lower Leg: Cleanse  wound with mild soap and water May Shower, gently pat wound dry prior to applying new dressing. Wound #2 Right,Medial Malleolus: Cleanse wound with mild soap and water May Shower, gently pat wound dry prior to applying new dressing. Lee Bryant, Lee Bryant ([id number]) Anesthetic: Wound #1 Right,Anterior Lower Leg: Topical Lidocaine 4% cream applied to wound bed prior to debridement Wound #2 Right,Medial Malleolus: Topical Lidocaine 4% cream applied to wound bed prior to debridement Primary Wound Dressing: Wound #1 Right,Anterior Lower Leg: Santyl Ointment Wound #2 Right,Medial Malleolus: Santyl Ointment Secondary Dressing: Wound #1 Right,Anterior Lower Leg: ABD pad Wound #2 Right,Medial Malleolus: Dry Gauze Dressing Change Frequency: Wound #1 Right,Anterior Lower Leg: Change dressing every week Wound #2 Right,Medial Malleolus: Change dressing every  week Follow-up Appointments: Wound #1 Right,Anterior Lower Leg: Return Appointment in 1 week. Wound #2 Right,Medial Malleolus: Return Appointment in 1 week. Edema Control: Wound #1 Right,Anterior Lower Leg: 3 Layer Compression System - Right Lower Extremity Elevate legs to the level of the heart and pump ankles as often as possible Wound #2 Right,Medial Malleolus: 3 Layer Compression System - Right Lower Extremity Additional Orders / Instructions: Wound #1 Right,Anterior Lower Leg: Increase protein intake. Activity as tolerated Wound #2 Right,Medial Malleolus: Increase protein intake. Activity as tolerated #1 the patient's wounds really look arterial although his arterial studies did not support arterial insufficiency as a cause of this wound presentation. He also has venous insufficiency based on clinical exam. #2 I'm going to continue Santyl to the wounds but increase the compression to 3 layer to control the swelling. If these wounds deteriorate or do not heal all order reflux studies and referred to vascular  for evaluation Lee Bryant, Lee Bryant ([id number]) Electronic Signature(s) Signed: 10/28/2016 6:05:53 PM By: Linton Ham MD Entered By: Linton Ham on 10/28/2016 12:31:23 [name] ([id number]) -------------------------------------------------------------------------------- SuperBill Details Lee Bryant Date of Service: 10/28/2016 Patient Name: G. Patient Account Number: [id number] Medical Record Treating RN: Baruch Gouty, RN, BSN, [name] [id number] Number: Other Clinician: Date of Birth/Sex: 11-12-1955 (60 y.o. 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 Chronic venous hypertension (idiopathic) with ulcer of right lower extremity Facility Procedures CPT4 Code Description: [id number] 11042 - DEB SUBQ TISSUE 20 SQ CM/< ICD-10 Description Diagnosis L97.211 Non-pressure chronic ulcer of right calf limited to 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

## 2016-11-04 ENCOUNTER — Ambulatory Visit: Payer: BLUE CROSS/BLUE SHIELD | Admitting: Internal Medicine

## 2017-03-01 ENCOUNTER — Encounter (INDEPENDENT_AMBULATORY_CARE_PROVIDER_SITE_OTHER): Payer: Self-pay | Admitting: Vascular Surgery

## 2017-03-01 ENCOUNTER — Ambulatory Visit (INDEPENDENT_AMBULATORY_CARE_PROVIDER_SITE_OTHER): Payer: BLUE CROSS/BLUE SHIELD | Admitting: Vascular Surgery

## 2017-03-01 VITALS — BP 145/83 | HR 103 | Resp 16 | Ht 73.0 in | Wt 271.0 lb

## 2017-03-01 DIAGNOSIS — R600 Localized edema: Secondary | ICD-10-CM | POA: Insufficient documentation

## 2017-03-01 DIAGNOSIS — L97909 Non-pressure chronic ulcer of unspecified part of unspecified lower leg with unspecified severity: Secondary | ICD-10-CM | POA: Insufficient documentation

## 2017-03-01 DIAGNOSIS — L97312 Non-pressure chronic ulcer of right ankle with fat layer exposed: Secondary | ICD-10-CM | POA: Diagnosis not present

## 2017-03-01 NOTE — Progress Notes (Signed)
Subjective:    Patient ID: Lee Bryant., male    DOB: 1956/06/15, 61 y.o.   MRN: [id number] Chief Complaint  Patient presents with  . New Evaluation    Non healing wound   Presents as a new patient referred by Dr. Ola Spurr for a slow healing wound. Patient endorses a history of a right lower extremity ankle and shin wound. States the wound has been present for about a month. There has been some improvement to the wounds however he states they are slow to heal. He is receiving wound care from Dr. Dellia Nims from the wound center. Wound care consists of debridements, unna wraps and Santyl. The patient underwent an ABI and lower extremity arterial duplex in 10/2016 - which I reviewed and were notable for good waveforms and no significant bilateral lower extremity peripheral artery diease. He has not undergone a venous workup yet. 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 No other trauma or surgery to the lower extremity. Has been on ABX. Denies any fever, nausea or vomiting. The patient will be without insurance come the end of this month. Unna wraps have been minimally effective. He does have a long standing history of bilateral lower extremity edema. Edema is worse at night. He is currently taking OTC anti-inflammatories and is being prescribed narcotics by Dr. Ginette Pitman for pain control.    Review of Systems  Constitutional: Negative.   HENT: Negative.   Eyes: Negative.   Respiratory: Negative.   Cardiovascular: Positive for leg swelling.  Gastrointestinal: Negative.   Endocrine: Negative.   Genitourinary: Negative.   Musculoskeletal: Negative.   Skin: Positive for wound.  Allergic/Immunologic: Negative.   Neurological: Negative.   Hematological: Negative.   Psychiatric/Behavioral: Negative.       Objective:   Physical Exam  Constitutional: He is oriented to person, place, and time. He appears well-developed and well-nourished. No distress.  HENT:  Head: Normocephalic and  atraumatic.  Eyes: Conjunctivae are normal. Pupils are equal, round, and reactive to light.  Neck: Normal range of motion.  Cardiovascular: Normal rate, regular rhythm, normal heart sounds and intact distal pulses.   Pulses:      Radial pulses are 2+ on the right side, and 2+ on the left side.  Hard to appreciate pedal pulses due to edema. Feet warm.   Pulmonary/Chest: Effort normal.  Musculoskeletal: Normal range of motion. He exhibits edema (Moderate 1+ Pitting Edema).  Neurological: He is alert and oriented to person, place, and time.  Skin: He is not diaphoretic.     Right Ankle: 4cm x 4cm round ulceration with fat exposed to medial right ankle. Granulation tissue noted. No drainage. No infection or cellulitis noted. Right distal skin: 1cm x 1cm ulceration with fat exposed to medial right ankle. Granulation tissue noted. No drainage. No infection or cellulitis noted. Stasis dermatitis noted on the bilateral lower extremity.   Vitals reviewed.  BP (!) 145/83 (BP Location: Right Arm)   Pulse (!) 103   Resp 16   Ht 6\' 1"  (1.854 m)   Wt 271 lb (122.9 kg)   BMI 35.75 kg/m   Past Medical History:  Diagnosis Date  . Cellulitis   . Diabetes mellitus ([place])    type II  . Hypertension    Social History   Social History  . Marital status: Married    Spouse name: N/A  . Number of children: N/A  . Years of education: N/A   Occupational History  . Not on file.  Social History Main Topics  . Smoking status: Former Research scientist (life sciences)  . Smokeless tobacco: Never Used     Comment: when he was 61yrs old.  . Alcohol use Yes     Comment: occassionally.  . Drug use: Yes    Types: Marijuana  . Sexual activity: Not on file   Other Topics Concern  . Not on file   Social History Narrative  . No narrative on file   No past surgical history on file.  Family History  Problem Relation Age of Onset  . Cancer Mother   . Hypertension Mother   . Heart attack Father   . Cancer Father   .  Stroke Sister   . Diabetes Brother   . Kidney failure Brother    Allergies  Allergen Reactions  . Cephalexin Rash      Assessment & Plan:  Presents as a new patient referred by Dr. Ola Spurr for a slow healing wound. Patient endorses a history of a right lower extremity ankle and shin wound. States the wound has been present for about a month. There has been some improvement to the wounds however he states they are slow to heal. He is receiving wound care from Dr. Dellia Nims from the wound center. Wound care consists of debridements, unna wraps and Santyl. The patient underwent an ABI and lower extremity arterial duplex in 10/2016 - which I reviewed and were notable for good waveforms and no significant bilateral lower extremity peripheral artery diease. He has not undergone a venous workup yet. 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 No other trauma or surgery to the lower extremity. Has been on ABX. Denies any fever, nausea or vomiting. The patient will be without insurance come the end of this month. Unna wraps have been minimally effective. He does have a long standing history of bilateral lower extremity edema. Edema is worse at night. He is currently taking OTC anti-inflammatories and is being prescribed narcotics by Dr. Ginette Pitman for pain control.   1. Skin ulcer of right ankle with fat layer exposed ([place]) - New Patient with active ulceration to the right lower extremity. ABI and LE ultrasound in 10/2016 without PAD - no indicated for arterial intervention indicated at this time.  Patient will need venous workup in future however is in need of immediate edema control. Will order venous duplex when patient has new insurance as his insurance will run out end of the month.  Recommend unna wraps for four weeks. This will control edema and allow for ulcerations to heal.  In addition, behavioral modification including elevation during the day will be initiated. Continue anti-inflammatories for pain  control. We discussed a lymphedema pump if conventional therapy goes not work. Patient to follow up in one month.   2. Bilateral lower extremity edema - New As above.  3. Diabetes Mellitus Type Two - Stable Encouraged good control as its slows the progression of atherosclerotic disease and impedes wound healing.   Current Outpatient Prescriptions on File Prior to Visit  Medication Sig Dispense Refill  . amLODipine (NORVASC) 10 MG tablet Take 10 mg by mouth daily.    Marland Kitchen aspirin EC 81 MG tablet Take 81 mg by mouth daily.    . fenofibrate (TRICOR) 48 MG tablet Take 48 mg by mouth daily.    Marland Kitchen gabapentin (NEURONTIN) 100 MG capsule Take 100 mg by mouth at bedtime.    Marland Kitchen glimepiride (AMARYL) 4 MG tablet Take 4 mg by mouth 2 (two) times daily.    Marland Kitchen  HYDROcodone-acetaminophen (NORCO/VICODIN) 5-325 MG tablet Take 5-325 tablets by mouth daily as needed.    Marland Kitchen ibuprofen (ADVIL,MOTRIN) 800 MG tablet Take 800 mg by mouth daily as needed.    . metFORMIN (GLUCOPHAGE) 1000 MG tablet Take 1,000 mg by mouth 2 (two) times daily.    . valsartan-hydrochlorothiazide (DIOVAN-HCT) 320-12.5 MG tablet TAKE ONE (1) TABLET EACH DAY    . hydrALAZINE (APRESOLINE) 25 MG tablet Take 25 mg by mouth daily.     No current facility-administered medications on file prior to visit.    There are no Patient Instructions on file for this visit. No Follow-up on file.  Juanette Urizar A Itay Mella, PA-C

## 2017-03-05 ENCOUNTER — Telehealth (INDEPENDENT_AMBULATORY_CARE_PROVIDER_SITE_OTHER): Payer: Self-pay | Admitting: Vascular Surgery

## 2017-03-05 NOTE — Telephone Encounter (Signed)
Dr. Blane Ohara office called to cancel all of Lee Bryant appointments. He has been complaining that he cannot wear the unna boots, it is too painful. He really does want the lymphedema pump. Is there another course of action we can take?

## 2017-03-08 ENCOUNTER — Encounter (INDEPENDENT_AMBULATORY_CARE_PROVIDER_SITE_OTHER): Payer: BLUE CROSS/BLUE SHIELD

## 2017-03-08 NOTE — Telephone Encounter (Signed)
As I explained to the patient, I need a month of either conservative therapy or treatment with unna boots before I can apply for the lymphedema pump. As explained to the patient, he needs to engage in compression and elevation to help heal his ulceration.

## 2017-03-12 ENCOUNTER — Inpatient Hospital Stay: Payer: BLUE CROSS/BLUE SHIELD

## 2017-03-12 ENCOUNTER — Inpatient Hospital Stay
Admission: AD | Admit: 2017-03-12 | Discharge: 2017-03-14 | DRG: 638 | Disposition: A | Discharge status: Home / Self Care | Payer: BLUE CROSS/BLUE SHIELD | Source: Ambulatory Visit | Attending: Internal Medicine | Admitting: Internal Medicine

## 2017-03-12 ENCOUNTER — Encounter (INDEPENDENT_AMBULATORY_CARE_PROVIDER_SITE_OTHER): Payer: BLUE CROSS/BLUE SHIELD

## 2017-03-12 DIAGNOSIS — Z86718 Personal history of other venous thrombosis and embolism: Secondary | ICD-10-CM

## 2017-03-12 DIAGNOSIS — I890 Lymphedema, not elsewhere classified: Secondary | ICD-10-CM | POA: Diagnosis present

## 2017-03-12 DIAGNOSIS — M79605 Pain in left leg: Secondary | ICD-10-CM | POA: Diagnosis present

## 2017-03-12 DIAGNOSIS — L97312 Non-pressure chronic ulcer of right ankle with fat layer exposed: Secondary | ICD-10-CM | POA: Diagnosis present

## 2017-03-12 DIAGNOSIS — L03115 Cellulitis of right lower limb: Secondary | ICD-10-CM | POA: Diagnosis present

## 2017-03-12 DIAGNOSIS — M1990 Unspecified osteoarthritis, unspecified site: Secondary | ICD-10-CM | POA: Diagnosis present

## 2017-03-12 DIAGNOSIS — I872 Venous insufficiency (chronic) (peripheral): Secondary | ICD-10-CM | POA: Diagnosis present

## 2017-03-12 DIAGNOSIS — M549 Dorsalgia, unspecified: Secondary | ICD-10-CM | POA: Diagnosis present

## 2017-03-12 DIAGNOSIS — Z7984 Long term (current) use of oral hypoglycemic drugs: Secondary | ICD-10-CM | POA: Diagnosis not present

## 2017-03-12 DIAGNOSIS — L97909 Non-pressure chronic ulcer of unspecified part of unspecified lower leg with unspecified severity: Secondary | ICD-10-CM | POA: Diagnosis present

## 2017-03-12 DIAGNOSIS — E119 Type 2 diabetes mellitus without complications: Secondary | ICD-10-CM | POA: Diagnosis not present

## 2017-03-12 DIAGNOSIS — I10 Essential (primary) hypertension: Secondary | ICD-10-CM | POA: Diagnosis present

## 2017-03-12 DIAGNOSIS — Z7982 Long term (current) use of aspirin: Secondary | ICD-10-CM

## 2017-03-12 DIAGNOSIS — Z6835 Body mass index (BMI) 35.0-35.9, adult: Secondary | ICD-10-CM | POA: Diagnosis not present

## 2017-03-12 DIAGNOSIS — M25519 Pain in unspecified shoulder: Secondary | ICD-10-CM | POA: Diagnosis present

## 2017-03-12 DIAGNOSIS — L089 Local infection of the skin and subcutaneous tissue, unspecified: Secondary | ICD-10-CM | POA: Diagnosis present

## 2017-03-12 DIAGNOSIS — R600 Localized edema: Secondary | ICD-10-CM | POA: Diagnosis not present

## 2017-03-12 DIAGNOSIS — Z87891 Personal history of nicotine dependence: Secondary | ICD-10-CM | POA: Diagnosis not present

## 2017-03-12 DIAGNOSIS — Z881 Allergy status to other antibiotic agents status: Secondary | ICD-10-CM

## 2017-03-12 DIAGNOSIS — E11628 Type 2 diabetes mellitus with other skin complications: Secondary | ICD-10-CM | POA: Diagnosis present

## 2017-03-12 DIAGNOSIS — Z79899 Other long term (current) drug therapy: Secondary | ICD-10-CM | POA: Diagnosis not present

## 2017-03-12 DIAGNOSIS — E11622 Type 2 diabetes mellitus with other skin ulcer: Secondary | ICD-10-CM | POA: Diagnosis present

## 2017-03-12 DIAGNOSIS — M79604 Pain in right leg: Secondary | ICD-10-CM | POA: Diagnosis present

## 2017-03-12 DIAGNOSIS — R609 Edema, unspecified: Secondary | ICD-10-CM

## 2017-03-12 LAB — MRSA PCR SCREENING: MRSA BY PCR: NEGATIVE

## 2017-03-12 LAB — COMPREHENSIVE METABOLIC PANEL WITH GFR
ALT: 17 U/L (ref 17–63)
AST: 19 U/L (ref 15–41)
Albumin: 3.6 g/dL (ref 3.5–5.0)
Alkaline Phosphatase: 41 U/L (ref 38–126)
Anion gap: 8 (ref 5–15)
BUN: 21 mg/dL — ABNORMAL HIGH (ref 6–20)
CO2: 27 mmol/L (ref 22–32)
Calcium: 10.2 mg/dL (ref 8.9–10.3)
Chloride: 100 mmol/L — ABNORMAL LOW (ref 101–111)
Creatinine, Ser: 1.08 mg/dL (ref 0.61–1.24)
GFR calc Af Amer: >60 mL/min (ref >60)
GFR calc non Af Amer: >60 mL/min (ref >60)
Glucose, Bld: 75 mg/dL (ref 65–99)
Potassium: 3.7 mmol/L (ref 3.5–5.1)
Sodium: 135 mmol/L (ref 135–145)
Total Bilirubin: 0.4 mg/dL (ref 0.3–1.2)
Total Protein: 7.4 g/dL (ref 6.5–8.1)

## 2017-03-12 LAB — CBC
HCT: 33.8 % — ABNORMAL LOW (ref 40.0–52.0)
Hemoglobin: 11.5 g/dL — ABNORMAL LOW (ref 13.0–18.0)
MCH: 28.4 pg (ref 26.0–34.0)
MCHC: 34.1 g/dL (ref 32.0–36.0)
MCV: 83.3 fL (ref 80.0–100.0)
Platelets: 358 K/uL (ref 150–440)
RBC: 4.06 MIL/uL — ABNORMAL LOW (ref 4.40–5.90)
RDW: 14.5 % (ref 11.5–14.5)
WBC: 13.4 K/uL — ABNORMAL HIGH (ref 3.8–10.6)

## 2017-03-12 LAB — GLUCOSE, CAPILLARY
GLUCOSE-CAPILLARY: 76 mg/dL (ref 65–99)
Glucose-Capillary: 160 mg/dL — ABNORMAL HIGH (ref 65–99)

## 2017-03-12 MED ORDER — COLLAGENASE 250 UNIT/GM EX OINT
TOPICAL_OINTMENT | Freq: Every day | CUTANEOUS | Status: DC
  Administered 2017-03-13: 08:00:00 via TOPICAL
  Filled 2017-03-12: qty 30

## 2017-03-12 MED ORDER — DEXTROSE 5 % IV SOLN
4.5000 g | Freq: Three times a day (TID) | INTRAVENOUS | Status: DC
  Administered 2017-03-12 – 2017-03-14 (×5): 4.5 g via INTRAVENOUS
  Filled 2017-03-12 (×8): qty 4.5

## 2017-03-12 MED ORDER — PIPERACILLIN-TAZOBACTAM 4.5 G IVPB
4.5000 g | Freq: Three times a day (TID) | INTRAVENOUS | Status: DC
  Filled 2017-03-12 (×3): qty 100

## 2017-03-12 MED ORDER — SODIUM CHLORIDE 0.9 % IV SOLN
1500.0000 mg | Freq: Once | INTRAVENOUS | Status: AC
  Administered 2017-03-12: 1500 mg via INTRAVENOUS
  Filled 2017-03-12: qty 1500

## 2017-03-12 MED ORDER — FENOFIBRATE 160 MG PO TABS
160.0000 mg | ORAL_TABLET | Freq: Every day | ORAL | Status: DC
  Administered 2017-03-13 – 2017-03-14 (×2): 160 mg via ORAL
  Filled 2017-03-12 (×2): qty 1

## 2017-03-12 MED ORDER — HYDROCODONE-ACETAMINOPHEN 7.5-325 MG PO TABS
1.0000 | ORAL_TABLET | Freq: Four times a day (QID) | ORAL | Status: DC | PRN
  Administered 2017-03-12 – 2017-03-13 (×6): 1 via ORAL
  Filled 2017-03-12 (×7): qty 1

## 2017-03-12 MED ORDER — ASPIRIN EC 81 MG PO TBEC
81.0000 mg | DELAYED_RELEASE_TABLET | Freq: Every day | ORAL | Status: DC
  Administered 2017-03-13 – 2017-03-14 (×2): 81 mg via ORAL
  Filled 2017-03-12 (×2): qty 1

## 2017-03-12 MED ORDER — MORPHINE SULFATE (PF) 2 MG/ML IV SOLN
2.0000 mg | INTRAVENOUS | Status: DC | PRN
  Administered 2017-03-12 (×2): 2 mg via INTRAVENOUS
  Filled 2017-03-12 (×2): qty 1

## 2017-03-12 MED ORDER — CYCLOBENZAPRINE HCL 10 MG PO TABS
10.0000 mg | ORAL_TABLET | Freq: Three times a day (TID) | ORAL | Status: DC | PRN
  Administered 2017-03-12: 10 mg via ORAL
  Filled 2017-03-12: qty 2

## 2017-03-12 MED ORDER — PIPERACILLIN-TAZOBACTAM 3.375 G IVPB
3.3750 g | Freq: Once | INTRAVENOUS | Status: DC
  Administered 2017-03-12: 3.375 g via INTRAVENOUS
  Filled 2017-03-12: qty 50

## 2017-03-12 MED ORDER — INSULIN ASPART 100 UNIT/ML ~~LOC~~ SOLN
0.0000 [IU] | Freq: Three times a day (TID) | SUBCUTANEOUS | Status: DC
Start: 2017-03-12 — End: 2017-03-14
  Administered 2017-03-12 – 2017-03-13 (×2): 2 [IU] via SUBCUTANEOUS
  Administered 2017-03-14: 1 [IU] via SUBCUTANEOUS
  Administered 2017-03-14: 2 [IU] via SUBCUTANEOUS
  Filled 2017-03-12: qty 1
  Filled 2017-03-12 (×3): qty 2

## 2017-03-12 MED ORDER — PIPERACILLIN-TAZOBACTAM 4.5 G IVPB: 4.5000 g | Freq: Three times a day (TID) | INTRAVENOUS | Status: DC

## 2017-03-12 MED ORDER — AMLODIPINE BESYLATE 10 MG PO TABS
10.0000 mg | ORAL_TABLET | Freq: Every day | ORAL | Status: DC
  Administered 2017-03-13: 10 mg via ORAL
  Filled 2017-03-12: qty 1

## 2017-03-12 MED ORDER — DOCUSATE SODIUM 100 MG PO CAPS: 100.0000 mg | ORAL_CAPSULE | Freq: Two times a day (BID) | ORAL | Status: DC | PRN

## 2017-03-12 MED ORDER — HEPARIN SODIUM (PORCINE) 5000 UNIT/ML IJ SOLN
5000.0000 [IU] | Freq: Three times a day (TID) | INTRAMUSCULAR | Status: DC
  Administered 2017-03-12 – 2017-03-14 (×6): 5000 [IU] via SUBCUTANEOUS
  Filled 2017-03-12 (×6): qty 1

## 2017-03-12 MED ORDER — GABAPENTIN 300 MG PO CAPS
300.0000 mg | ORAL_CAPSULE | Freq: Three times a day (TID) | ORAL | Status: DC
  Administered 2017-03-12 – 2017-03-13 (×5): 300 mg via ORAL
  Filled 2017-03-12 (×5): qty 1

## 2017-03-12 MED ORDER — METFORMIN HCL 500 MG PO TABS
1000.0000 mg | ORAL_TABLET | Freq: Two times a day (BID) | ORAL | Status: DC
  Administered 2017-03-12 – 2017-03-14 (×4): 1000 mg via ORAL
  Filled 2017-03-12 (×4): qty 2

## 2017-03-12 MED ORDER — METRONIDAZOLE 500 MG PO TABS: 500.0000 mg | ORAL_TABLET | Freq: Three times a day (TID) | ORAL | Status: DC

## 2017-03-12 MED ORDER — PIPERACILLIN-TAZOBACTAM 3.375 G IVPB
3.3750 g | Freq: Three times a day (TID) | INTRAVENOUS | Status: DC
  Filled 2017-03-12: qty 50

## 2017-03-12 MED ORDER — MORPHINE SULFATE (PF) 2 MG/ML IV SOLN
2.0000 mg | INTRAVENOUS | Status: DC | PRN
  Administered 2017-03-12 – 2017-03-14 (×5): 2 mg via INTRAVENOUS
  Filled 2017-03-12 (×5): qty 1

## 2017-03-12 MED ORDER — VANCOMYCIN HCL 10 G IV SOLR
1250.0000 mg | Freq: Two times a day (BID) | INTRAVENOUS | Status: DC
  Administered 2017-03-12 – 2017-03-13 (×3): 1250 mg via INTRAVENOUS
  Filled 2017-03-12 (×5): qty 1250

## 2017-03-12 NOTE — Consult Note (Signed)
[facility] Nurse wound consult note Reason for Consult: Nonhealing ulcers to right lower leg.  Has been wearing Unnas boots for compression and states legs have progressively gotten worse.  Refusing all compression and debridement at this time.  ABI results were 0.9 but patient reports symptoms of claudication including leg pain that has kept him from sleeping from 4 days.  Has erythema to bilateral lower legs, consistent with dependent rubor.   Wound type: ulcers of unclear etiology.  Has been going to wound care center.  Pressure Injury POA: N/A Measurement: Right transmetatarsal area with denuded skin with adherent slough noted to wound bed.  Surrounding erythema and pain. Right malleolus:  3.6 cm x 4 cm with 100% thin adherent slough to wound bed with periwound erythema.  Well demarcated.  Right lateral lower leg near malleolus 2 areas, 1.5 cm x 1 cm and 1 cm x 1 cm 1005 adherent slough Wound bed:1005 adherent slough Drainage (amount, consistency, odor) minimal serosanguinous  Musty odor.  Periwound:edema, erythema and appearance consistent with dependent rubor.  Leg pain causing patient to be unable to lie down, per son at bedside, he has not slept in 4 days.  States the pain is not new with onset of cellulitis symptoms.   Dressing procedure/placement/frequency: patient refusing compression at this time.  Cleanse right leg with soap and water and pat gently dry.  Apply Santyl enzymatic debrider to wounds.  Cover with NS moist gauze.  Cover with 4x4 gauze, kerlix and tape.  Change daily. Awaiting vascular consult.  Will defer to vascular orders if given.  [facility] team will follow.   [name] RN BSN [place] Pager [phone]

## 2017-03-12 NOTE — H&P (Signed)
[facility] NAME: Lee Bryant    MR#:  [id number]  DATE OF BIRTH:  June 17, 1956  DATE OF ADMISSION:  03/12/2017  PRIMARY CARE PHYSICIAN: Tracie Harrier, MD   REQUESTING/REFERRING PHYSICIAN: hande  CHIEF COMPLAINT:  No chief complaint on file.   HISTORY OF PRESENT ILLNESS: Lee Bryant  is a 61 y.o. male with a known history of cellulitis, diabetes, hypertension-has chronic bilateral leg swelling and which is getting worse he had some injuries and ulcers in the past on his legs which is not healing he received antibiotic treatments frequently as outpatient and follow with infectious disease specialist and primary care physician, had also seen wound care clinic and vascular clinic in the past. His ulcers and his pain keep getting worse on his both legs and mostly in the right lower extremity so finally when he was in to see Dr. Ginette Pitman in office today he after speaking with infectious disease specialist suggested to send patient for direct admission to the hospital as he may need IV antibiotics and evaluation by vascular surgeon. Patient said he had tried multiple things he also have applied Unna boots in past as advised by wound care and vascular but it did not help but actually make the swelling worse.   PAST MEDICAL HISTORY:   Past Medical History:  Diagnosis Date  . Cellulitis   . Diabetes mellitus ([place])    type II  . Hypertension     PAST SURGICAL HISTORY: No past surgical history on file.  SOCIAL HISTORY:  Social History  Substance Use Topics  . Smoking status: Former Smoker    Types: Cigarettes  . Smokeless tobacco: Never Used     Comment: when he was 61yrs old.  . Alcohol use Yes     Comment: occassionally.    FAMILY HISTORY:  Family History  Problem Relation Age of Onset  . Cancer Mother   . Hypertension Mother   . Heart attack Father   . Cancer Father   . Stroke Sister   . Diabetes Brother   . Kidney  failure Brother     DRUG ALLERGIES:  Allergies  Allergen Reactions  . Cephalexin Rash    REVIEW OF SYSTEMS:   CONSTITUTIONAL: No fever, fatigue or weakness.  EYES: No blurred or double vision.  EARS, NOSE, AND THROAT: No tinnitus or ear pain.  RESPIRATORY: No cough, shortness of breath, wheezing or hemoptysis.  CARDIOVASCULAR: No chest pain, orthopnea, edema.  GASTROINTESTINAL: No nausea, vomiting, diarrhea or abdominal pain.  GENITOURINARY: No dysuria, hematuria.  ENDOCRINE: No polyuria, nocturia,  HEMATOLOGY: No anemia, easy bruising or bleeding SKIN: No rash , he is ulcers on his right leg and some on his left leg also.  MUSCULOSKELETAL: No joint pain or arthritis.   NEUROLOGIC: No tingling, numbness, weakness.  PSYCHIATRY: No anxiety or depression.   MEDICATIONS AT HOME:  Prior to Admission medications   Medication Sig Start Date End Date Taking? Authorizing Provider  aspirin EC 81 MG tablet Take 81 mg by mouth daily.   Yes [provider]  collagenase (SANTYL) ointment Apply topically. 02/12/17  Yes [provider]  cyclobenzaprine (FLEXERIL) 10 MG tablet  02/12/17  Yes [provider]  fenofibrate (TRICOR) 48 MG tablet Take 48 mg by mouth daily. 09/07/16  Yes [provider]  gabapentin (NEURONTIN) 300 MG capsule  01/11/17  Yes [provider]  glimepiride (AMARYL) 4 MG tablet Take 4 mg by mouth 2 (two)  times daily. 10/02/16 10/02/17 Yes [provider]  HYDROcodone-acetaminophen (NORCO/VICODIN) 5-325 MG tablet Take 5-325 tablets by mouth daily as needed. 10/02/16  Yes [provider]  ibuprofen (ADVIL,MOTRIN) 800 MG tablet Take 800 mg by mouth daily as needed. 09/24/16  Yes [provider]  metFORMIN (GLUCOPHAGE) 1000 MG tablet Take 1,000 mg by mouth 2 (two) times daily. 08/17/16 08/17/17 Yes [provider]  valsartan-hydrochlorothiazide (DIOVAN-HCT) 320-12.5 MG tablet TAKE ONE (1) TABLET EACH  DAY 07/02/16  Yes [provider]  amLODipine (NORVASC) 10 MG tablet Take 10 mg by mouth daily. 09/07/16   [provider]  ciprofloxacin (CIPRO) 750 MG tablet  02/19/17   [provider]  gabapentin (NEURONTIN) 100 MG capsule Take 600 mg by mouth 3 (three) times daily.  10/02/16 10/02/17  [provider]  hydrALAZINE (APRESOLINE) 25 MG tablet Take 25 mg by mouth daily. 02/14/16 02/13/17  [provider]  lidocaine (XYLOCAINE) 2 % jelly  01/14/17   [provider]  metroNIDAZOLE (FLAGYL) 500 MG tablet  02/25/17   [provider]      PHYSICAL EXAMINATION:   VITAL SIGNS: Blood pressure (!) 146/86, pulse (!) 109, temperature 98 F (36.7 C), temperature source Oral, resp. rate 18, height 6\' 1"  (1.854 m), weight 122.9 kg (271 lb), SpO2 94 %.  GENERAL:  61 y.o.-year-old patient lying in the bed with  acute distressDue to pain in his leg.  EYES: Pupils equal, round, reactive to light and accommodation. No scleral icterus. Extraocular muscles intact.  HEENT: Head atraumatic, normocephalic. Oropharynx and nasopharynx clear.  NECK:  Supple, no jugular venous distention. No thyroid enlargement, no tenderness.  LUNGS: Normal breath sounds bilaterally, no wheezing, rales,rhonchi or crepitation. No use of accessory muscles of respiration.  CARDIOVASCULAR: S1, S2 normal. No murmurs, rubs, or gallops.  ABDOMEN: Soft, nontender, nondistended. Bowel sounds present. No organomegaly or mass.  EXTREMITIES:Bilateral  pedal edema,no cyanosis or redness, right leg and feet have multiple ulcers.  NEUROLOGIC: Cranial nerves II through XII are intact. Muscle strength 5/5 in all extremities. Sensation intact. Gait not checked.  PSYCHIATRIC: The patient is alert and oriented x 3.  SKIN: No obvious rash, lesion, or ulcer.   LABORATORY PANEL:   CBC  Recent Labs Lab 03/12/17 1354  WBC 13.4*  HGB 11.5*  HCT 33.8*  PLT 358  MCV 83.3  MCH 28.4  MCHC 34.1   RDW 14.5   ------------------------------------------------------------------------------------------------------------------  Chemistries   Recent Labs Lab 03/12/17 1354  NA 135  K 3.7  CL 100*  CO2 27  GLUCOSE 75  BUN 21*  CREATININE 1.08  CALCIUM 10.2  AST 19  ALT 17  ALKPHOS 41  BILITOT 0.4   ------------------------------------------------------------------------------------------------------------------ estimated creatinine clearance is 97.4 mL/min (by C-G formula based on SCr of 1.08 mg/dL). ------------------------------------------------------------------------------------------------------------------ No results for input(s): TSH, T4TOTAL, T3FREE, THYROIDAB in the last 72 hours.  Invalid input(s): FREET3   Coagulation profile No results for input(s): INR, PROTIME in the last 168 hours. ------------------------------------------------------------------------------------------------------------------- No results for input(s): DDIMER in the last 72 hours. -------------------------------------------------------------------------------------------------------------------  Cardiac Enzymes No results for input(s): CKMB, TROPONINI, MYOGLOBIN in the last 168 hours.  Invalid input(s): CK ------------------------------------------------------------------------------------------------------------------ Invalid input(s): POCBNP  ---------------------------------------------------------------------------------------------------------------  Urinalysis No results found for: COLORURINE, APPEARANCEUR, LABSPEC, PHURINE, GLUCOSEU, HGBUR, BILIRUBINUR, KETONESUR, PROTEINUR, UROBILINOGEN, NITRITE, LEUKOCYTESUR   RADIOLOGY: US Venous Img Lower Bilateral  Result Date: 03/12/2017 CLINICAL DATA:  Chronic four-month history of bilateral lower extremity edema which is temporally related to an injury to  the right lower extremity. EXAM: BILATERAL LOWER EXTREMITY VENOUS DOPPLER  ULTRASOUND TECHNIQUE: Gray-scale sonography with graded compression, as well as color Doppler and duplex ultrasound were performed to evaluate the lower extremity deep venous systems from the level of the common femoral vein and including the common femoral, femoral, profunda femoral, popliteal and calf veins including the posterior tibial, peroneal and gastrocnemius veins when visible. The superficial great saphenous vein was also interrogated. Spectral Doppler was utilized to evaluate flow at rest and with distal augmentation maneuvers in the common femoral, femoral and popliteal veins. COMPARISON:  None. FINDINGS: RIGHT LOWER EXTREMITY Common Femoral Vein: No evidence of thrombus. Normal compressibility, respiratory phasicity and response to augmentation. Saphenofemoral Junction: No evidence of thrombus. Normal compressibility and flow on color Doppler imaging. Profunda Femoral Vein: No evidence of thrombus. Normal compressibility and flow on color Doppler imaging. Femoral Vein: No evidence of thrombus. Normal compressibility, respiratory phasicity and response to augmentation. Popliteal Vein: No evidence of thrombus. Normal compressibility, respiratory phasicity and response to augmentation. Calf Veins: No evidence of thrombus. Normal compressibility and flow on color Doppler imaging. Superficial Great Saphenous Vein: Not evaluated. Venous Reflux:  Not evaluated. Other Findings:  None. LEFT LOWER EXTREMITY Common Femoral Vein: No evidence of thrombus. Normal compressibility, respiratory phasicity and response to augmentation. Saphenofemoral Junction: No evidence of thrombus. Normal compressibility and flow on color Doppler imaging. Profunda Femoral Vein: No evidence of thrombus. Normal compressibility and flow on color Doppler imaging. Femoral Vein: No evidence of thrombus. Normal compressibility, respiratory phasicity and response to augmentation. Popliteal Vein: No evidence of thrombus. Normal compressibility,  respiratory phasicity and response to augmentation. Calf Veins: No evidence of thrombus. Normal compressibility and flow on color Doppler imaging. Superficial Great Saphenous Vein: Not evaluated. Venous Reflux:  Not evaluated. Other Findings:  None. IMPRESSION: No evidence of DVT involving either the right or left lower extremity. Electronically Signed   By: Evangeline Dakin M.D.   On: 03/12/2017 17:10    EKG: Orders placed or performed in visit on 10/27/16  . EKG 12-Lead    IMPRESSION AND PLAN:  * Cellulitis and ulcers with diabetes on leg   IV vancomycin and Zosyn for now, get MRSA screening and ID consult.   As he has severe edema and pain I will also get vascular consult.   There may be a component of neurological issue undergoing, we may need to consider if vascular has nothing to add.   We'll also get echocardiogram. Doppler studies of lower extremities.  * Diabetes   Continue baseline home medicine and keep on sliding scale coverage.  * Hypertension   Continue home medicines and monitor.  * Pain   Management with hydrocodone and morphine.  All the records are reviewed and case discussed with ED provider. Management plans discussed with the patient, family and they are in agreement.  CODE STATUS:Full code.    Code Status Orders        Start     Ordered   03/12/17 1327  Full code  Continuous     03/12/17 1326    Code Status History    Date Active Date Inactive Code Status Order ID Comments User Context   This patient has a current code status but no historical code status.       TOTAL TIME TAKING CARE OF THIS PATIENT: 50 minutes.    Vaughan Basta M.D on 03/12/2017   Between 7am to 6pm - Pager - [phone]  After 6pm go to [email or web address] -  password [email or web address] [facility]  Office  [phone]  CC: Primary care physician; Tracie Harrier, MD   Note: This dictation was prepared with Dragon dictation along with smaller phrase  technology. Any transcriptional errors that result from this process are unintentional.

## 2017-03-12 NOTE — Consult Note (Addendum)
[facility] Infectious Disease     Reason for Consult: LE wounds    Referring Physician: Dolores Frame  Date of Admission:  03/12/2017   Principal Problem:   Skin ulcer of right ankle with fat layer exposed ([place]) Active Problems:   Diabetic foot infection ([place])   HPI: Lee Bryant. is a 61 y.o. male well known to me as outpatient with chronic non healing ulcers of RLE for over over 5-6 months following an injury to his anterior shin with a scrape by a concrete block. Since then he has had wound care fu and had debridement of the ant shin would and a newer medial ankle wound, however the wounds have markedly progressed and seemed to worsen with debridement.  He had pseudomonas noted on cx in nov but fu cultures have all been negative since he was treated with ciprofloxacin at that time.  He has had eval as well by dermatology with bxp due to concern for pyoderma gangrenosum but this was not conclusive. He has seen vascular as otpt and had nml ABI which were normal. He has been treated with unawraps but did not tolerate them, he has also been unable to elevate his leg due to severe pain with elevation. He was seen by Dr Ginette Pitman today and the wounds were progressive and necrotic and admitted for further evaluation and treatment.   Past Medical History:  Diagnosis Date  . Cellulitis   . Diabetes mellitus ([place])    type II  . Hypertension    No past surgical history on file. Social History  Substance Use Topics  . Smoking status: Former Research scientist (life sciences)  . Smokeless tobacco: Never Used     Comment: when he was 61yr old.  . Alcohol use Yes     Comment: occassionally.   Family History  Problem Relation Age of Onset  . Cancer Mother   . Hypertension Mother   . Heart attack Father   . Cancer Father   . Stroke Sister   . Diabetes Brother   . Kidney failure Brother     Allergies:  Allergies  Allergen Reactions  . Cephalexin Rash    Current antibiotics: Antibiotics Given (last 72  hours)    None      MEDICATIONS: . amLODipine  10 mg Oral Daily  . [START ON 03/13/2017] aspirin EC  81 mg Oral Daily  . collagenase   Topical Daily  . [START ON 03/13/2017] fenofibrate  160 mg Oral Daily  . gabapentin  300 mg Oral TID  . heparin  5,000 Units Subcutaneous Q8H  . metFORMIN  1,000 mg Oral BID WC    Review of Systems - 11 systems reviewed and negative per HPI   OBJECTIVE: Temp:  [98 F (36.7 C)] 98 F (36.7 C) (05/25 1250) Pulse Rate:  [109] 109 (05/25 1250) Resp:  [18] 18 (05/25 1250) BP: (146)/(86) 146/86 (05/25 1250) SpO2:  [94 %] 94 % (05/25 1250) Weight:  [122.9 kg (271 lb)] 122.9 kg (271 lb) (05/25 1100) Physical Exam  Constitutional: He is oriented to person, place, and time. Obese HENT:  Mouth/Throat: Oropharynx is clear and moist. No oropharyngeal exudate.  Cardiovascular: Normal rate, regular rhythm and normal heart sounds. Distan Pulmonary/Chest: Effort normal and breath sounds normal. No respiratory distress. He has no wheezes.  Abdominal: obese Soft. Bowel sounds are normal. He exhibits no distension. There is no tenderness.  Lymphadenopathy: He has no cervical adenopathy.  Neurological: He is alert and oriented to person, place,  and time.  Ext 2+ RLE edema, 1+ LLE Skin: ant shin with ulceration with white exudate and surrounding erythema. He has post calf ulcer with yellow thick exudate, medial ankle with large heaped up lesion with some granualtion tissue but mod exudate Psychiatric: tearful, in pain  LABS: Results for orders placed or performed during the hospital encounter of 03/12/17 (from the past 48 hour(s))  MRSA PCR Screening     Status: None   Collection Time: 03/12/17  1:26 PM  Result Value Ref Range   MRSA by PCR NEGATIVE NEGATIVE    Comment:        The GeneXpert MRSA Assay (FDA approved for NASAL specimens only), is one component of a comprehensive MRSA colonization surveillance program. It is not intended to diagnose  MRSA infection nor to guide or monitor treatment for MRSA infections.   CBC     Status: Abnormal   Collection Time: 03/12/17  1:54 PM  Result Value Ref Range   WBC 13.4 (H) 3.8 - 10.6 K/uL   RBC 4.06 (L) 4.40 - 5.90 MIL/uL   Hemoglobin 11.5 (L) 13.0 - 18.0 g/dL   HCT 33.8 (L) 40.0 - 52.0 %   MCV 83.3 80.0 - 100.0 fL   MCH 28.4 26.0 - 34.0 pg   MCHC 34.1 32.0 - 36.0 g/dL   RDW 14.5 11.5 - 14.5 %   Platelets 358 150 - 440 K/uL  Comprehensive metabolic panel     Status: Abnormal   Collection Time: 03/12/17  1:54 PM  Result Value Ref Range   Sodium 135 135 - 145 mmol/L   Potassium 3.7 3.5 - 5.1 mmol/L   Chloride 100 (L) 101 - 111 mmol/L   CO2 27 22 - 32 mmol/L   Glucose, Bld 75 65 - 99 mg/dL   BUN 21 (H) 6 - 20 mg/dL   Creatinine, Ser 1.08 0.61 - 1.24 mg/dL   Calcium 10.2 8.9 - 10.3 mg/dL   Total Protein 7.4 6.5 - 8.1 g/dL   Albumin 3.6 3.5 - 5.0 g/dL   AST 19 15 - 41 U/L   ALT 17 17 - 63 U/L   Alkaline Phosphatase 41 38 - 126 U/L   Total Bilirubin 0.4 0.3 - 1.2 mg/dL   GFR calc non Af Amer >60 >60 mL/min   GFR calc Af Amer >60 >60 mL/min    Comment: (NOTE) The eGFR has been calculated using the CKD EPI equation. This calculation has not been validated in all clinical situations. eGFR's persistently <60 mL/min signify possible Chronic Kidney Disease.    Anion gap 8 5 - 15   No components found for: ESR, C REACTIVE PROTEIN MICRO: Recent Results (from the past 720 hour(s))  MRSA PCR Screening     Status: None   Collection Time: 03/12/17  1:26 PM  Result Value Ref Range Status   MRSA by PCR NEGATIVE NEGATIVE Final    Comment:        The GeneXpert MRSA Assay (FDA approved for NASAL specimens only), is one component of a comprehensive MRSA colonization surveillance program. It is not intended to diagnose MRSA infection nor to guide or monitor treatment for MRSA infections.     IMAGING: No results found.  Assessment:   Lee Bryant. is a 61 y.o. male  with morbid obesity, well controlled DM and progressive ulceration of his R leg and ankle since a minor injury to the anterior shin in October or November. He has had a progressive ulceration  despite cxs with only skin flora for several months and being on oral abx. Initial cx with Pseudomonas but that was treated and has not been isolated again. He had been seeing wound center but is convinced that the debridements done there have worsened the wounds. He has not tolerated unnawraps to control the swelling and he reports significant pain with elevation of his leg so have not done that either. He has had ABI which was normal and has had bxp by derm due to concern for pyoderma but this was inconclusive.  I am not sure if the poor healing is due to his inability to comply with control of edema or if this could be undiagnosed arterial issues or could be pyoderma. I have spoken with Dr Evorn Gong about the patient and he agrees that if wu is unrevealing he may benefit from a trial of prednisone with close supervision.  At this point I think he will need further vascular evaluation to see if there is arterial issues leading to the pain with elevation and the poor healing. Query if he needs an angiogram.   Recommendations Agree with vanco and  zosyn  He has a cephalexin allergy so will avoid Consult vascular- if agreeable I would suggest angiography given nonhealing nature of these wounds. Continue wound care per wound nurse. Check doppler as I do not see one done. Check echo and wu for edema Culture wound Would avoid any debridements as if this is pyoderma debridements will worsen the wound.  Thank you very much for allowing me to participate in the care of this patient. Please call with questions.   Cheral Marker. Ola Spurr, MD

## 2017-03-12 NOTE — Progress Notes (Addendum)
Pharmacy Antibiotic Note  Lee Bryant. is a 61 y.o. male admitted on 03/12/2017 with cellulitis.  Pharmacy has been consulted for vancomycin and Zosyn dosing. Pt admitted after failing outpt abx with cipro and metronidazole.  Pt with allergy to cephalexin noted in the chart. Spoke to pt's wife who stated pt is only allergic to cephalexin; unclear if patient has ever had a PCN. Made ordering MD aware of allergy with plan to monitor patient with first dose of Zosyn; informed pt's RN as well to monitor pt for s/sx of allergic reaction. Also discussed with ID MD.   Plan: Will order vancomycin 1500 mg IV x1 and Zosyn 3.375 g IV x1 as pt's labs have not been drawn yet.   SCr 1.08 Will continue dosing with Zosyn 4.5 g IV q8h EI due to weight >120 kg and hx of pseudomonas infection Will continue with vancomycin 1250 mg IV q12h. Will need to monitor renal function closely and monitor for dose accumulation. BMET already ordered for AM. Trough before 4th dose.    Height: 6\' 1"  (185.4 cm) Weight: 271 lb (122.9 kg) IBW/kg (Calculated) : 79.9  Temp (24hrs), Avg:98 F (36.7 C), Min:98 F (36.7 C), Max:98 F (36.7 C)  No results for input(s): WBC, CREATININE, LATICACIDVEN, VANCOTROUGH, VANCOPEAK, VANCORANDOM, GENTTROUGH, GENTPEAK, GENTRANDOM, TOBRATROUGH, TOBRAPEAK, TOBRARND, AMIKACINPEAK, AMIKACINTROU, AMIKACIN in the last 168 hours.  CrCl cannot be calculated (No order found.).    Allergies  Allergen Reactions  . Cephalexin Rash    Antimicrobials this admission: Vancomycin/Zosyn 5/25 >>  Dose adjustments this admission:   Microbiology results:   Thank you for allowing pharmacy to be a part of this patient's care.  Rocky Morel 03/12/2017 2:13 PM

## 2017-03-13 ENCOUNTER — Inpatient Hospital Stay
Admission: AD | Admit: 2017-03-13 | Payer: BLUE CROSS/BLUE SHIELD | Source: Ambulatory Visit | Admitting: Internal Medicine

## 2017-03-13 DIAGNOSIS — I10 Essential (primary) hypertension: Secondary | ICD-10-CM

## 2017-03-13 DIAGNOSIS — R600 Localized edema: Secondary | ICD-10-CM

## 2017-03-13 DIAGNOSIS — E119 Type 2 diabetes mellitus without complications: Secondary | ICD-10-CM

## 2017-03-13 DIAGNOSIS — M79604 Pain in right leg: Secondary | ICD-10-CM

## 2017-03-13 LAB — BASIC METABOLIC PANEL
Anion gap: 9 (ref 5–15)
BUN: 16 mg/dL (ref 6–20)
CHLORIDE: 102 mmol/L (ref 101–111)
CO2: 27 mmol/L (ref 22–32)
CREATININE: 1.01 mg/dL (ref 0.61–1.24)
Calcium: 9.6 mg/dL (ref 8.9–10.3)
GFR calc Af Amer: >60 mL/min (ref >60)
GFR calc non Af Amer: >60 mL/min (ref >60)
GLUCOSE: 70 mg/dL (ref 65–99)
POTASSIUM: 3.5 mmol/L (ref 3.5–5.1)
Sodium: 138 mmol/L (ref 135–145)

## 2017-03-13 LAB — GLUCOSE, CAPILLARY
GLUCOSE-CAPILLARY: 116 mg/dL — AB (ref 65–99)
Glucose-Capillary: 85 mg/dL (ref 65–99)
Glucose-Capillary: 157 mg/dL — ABNORMAL HIGH (ref 65–99)
Glucose-Capillary: 105 mg/dL — ABNORMAL HIGH (ref 65–99)

## 2017-03-13 LAB — CBC
HEMATOCRIT: 32.5 % — AB (ref 40.0–52.0)
Hemoglobin: 11.4 g/dL — ABNORMAL LOW (ref 13.0–18.0)
MCH: 29.3 pg (ref 26.0–34.0)
MCHC: 35.1 g/dL (ref 32.0–36.0)
MCV: 83.5 fL (ref 80.0–100.0)
PLATELETS: 327 10*3/uL (ref 150–440)
RBC: 3.89 MIL/uL — ABNORMAL LOW (ref 4.40–5.90)
RDW: 14.3 % (ref 11.5–14.5)
WBC: 10.3 10*3/uL (ref 3.8–10.6)

## 2017-03-13 LAB — HIV ANTIBODY (ROUTINE TESTING W REFLEX): HIV SCREEN 4TH GENERATION: NONREACTIVE

## 2017-03-13 NOTE — Plan of Care (Signed)
Problem: Safety: Goal: Ability to remain free from injury will improve Outcome: Progressing Pt able to ask for assistance when needing to ambulate. Wife at bedside helps pt ambulate when needed.  Problem: Pain Managment: Goal: General experience of comfort will improve Outcome: Progressing Pain control with oral and iv medications.  Problem: Activity: Goal: Risk for activity intolerance will decrease Outcome: Progressing Pt able to ambulate around room  Problem: Nutrition: Goal: Adequate nutrition will be maintained Outcome: Progressing Eating and drinking without difficulty.

## 2017-03-13 NOTE — Progress Notes (Signed)
Patient a&o, VSS. Pain and throbbing in right lower extremity. Dressing changed, ointment applied. IV antibiotic currently infusing. Family member at bedside. Patient sitting up on side of bed. Continue to monitor.

## 2017-03-13 NOTE — Progress Notes (Signed)
Patient seen and examined  Increasing right leg pain associated with massive swelling  Patient has preexisting venous ulcer and therefore venous insufficiency as a baseline  Because of the increased pain he has been positioning himself with his legs in a totally dependent position and I believe this has exacerbated his swelling.  However, given his history of degenerative back disease and the lack of finding to support cellulitis I feel the leg pain is not a result of the ulcers and requires separate investigation.  Full consult to follow

## 2017-03-13 NOTE — Progress Notes (Signed)
[place] [facility] [place] NAME: Lee Bryant    MR#:  [id number]  DATE OF BIRTH:  04-04-1956  SUBJECTIVE:   Came in with worsening leg pain and ulcer-non healing REVIEW OF SYSTEMS:   Review of Systems  Constitutional: Negative for chills, fever and weight loss.  HENT: Negative for ear discharge, ear pain and nosebleeds.   Eyes: Negative for blurred vision, pain and discharge.  Respiratory: Negative for sputum production, shortness of breath, wheezing and stridor.   Cardiovascular: Positive for leg swelling. Negative for chest pain, palpitations, orthopnea and PND.  Gastrointestinal: Negative for abdominal pain, diarrhea, nausea and vomiting.  Genitourinary: Negative for frequency and urgency.  Musculoskeletal: Negative for back pain and joint pain.  Neurological: Positive for weakness. Negative for sensory change, speech change and focal weakness.  Psychiatric/Behavioral: Negative for depression and hallucinations. The patient is not nervous/anxious.    Tolerating Diet:yes Tolerating PT:   DRUG ALLERGIES:   Allergies  Allergen Reactions  . Cephalexin Rash    VITALS:  Blood pressure (!) 141/63, pulse 99, temperature 98.9 F (37.2 C), temperature source Oral, resp. rate 18, height 6\' 1"  (1.854 m), weight 122.9 kg (271 lb), SpO2 96 %.  PHYSICAL EXAMINATION:   Physical Exam  GENERAL:  61 y.o.-year-old patient lying in the bed with no acute distress. Obese EYES: Pupils equal, round, reactive to light and accommodation. No scleral icterus. Extraocular muscles intact.  HEENT: Head atraumatic, normocephalic. Oropharynx and nasopharynx clear.  NECK:  Supple, no jugular venous distention. No thyroid enlargement, no tenderness.  LUNGS: Normal breath sounds bilaterally, no wheezing, rales, rhonchi. No use of accessory muscles of respiration.  CARDIOVASCULAR: S1, S2 normal. No murmurs, rubs, or gallops.  ABDOMEN: Soft, nontender,  nondistended. Bowel sounds present. No organomegaly or mass.  EXTREMITIES:bilateral leg edema with nonhealing large ulcers ont he right leg NEUROLOGIC: Cranial nerves II through XII are intact. No focal Motor or sensory deficits b/l.   PSYCHIATRIC:  patient is alert and oriented x 3.  SKIN: No obvious rash, lesion, or ulcer.   LABORATORY PANEL:  CBC  Recent Labs Lab 03/13/17 0332  WBC 10.3  HGB 11.4*  HCT 32.5*  PLT 327    Chemistries   Recent Labs Lab 03/12/17 1354 03/13/17 0332  NA 135 138  K 3.7 3.5  CL 100* 102  CO2 27 27  GLUCOSE 75 70  BUN 21* 16  CREATININE 1.08 1.01  CALCIUM 10.2 9.6  AST 19  --   ALT 17  --   ALKPHOS 41  --   BILITOT 0.4  --    Cardiac Enzymes No results for input(s): TROPONINI in the last 168 hours. RADIOLOGY:  US Venous Img Lower Bilateral  Result Date: 03/12/2017 CLINICAL DATA:  Chronic four-month history of bilateral lower extremity edema which is temporally related to an injury to the right lower extremity. EXAM: BILATERAL LOWER EXTREMITY VENOUS DOPPLER ULTRASOUND TECHNIQUE: Gray-scale sonography with graded compression, as well as color Doppler and duplex ultrasound were performed to evaluate the lower extremity deep venous systems from the level of the common femoral vein and including the common femoral, femoral, profunda femoral, popliteal and calf veins including the posterior tibial, peroneal and gastrocnemius veins when visible. The superficial great saphenous vein was also interrogated. Spectral Doppler was utilized to evaluate flow at rest and with distal augmentation maneuvers in the common femoral, femoral and popliteal veins. COMPARISON:  None. FINDINGS: RIGHT LOWER EXTREMITY Common Femoral Vein: No  evidence of thrombus. Normal compressibility, respiratory phasicity and response to augmentation. Saphenofemoral Junction: No evidence of thrombus. Normal compressibility and flow on color Doppler imaging. Profunda Femoral Vein: No  evidence of thrombus. Normal compressibility and flow on color Doppler imaging. Femoral Vein: No evidence of thrombus. Normal compressibility, respiratory phasicity and response to augmentation. Popliteal Vein: No evidence of thrombus. Normal compressibility, respiratory phasicity and response to augmentation. Calf Veins: No evidence of thrombus. Normal compressibility and flow on color Doppler imaging. Superficial Great Saphenous Vein: Not evaluated. Venous Reflux:  Not evaluated. Other Findings:  None. LEFT LOWER EXTREMITY Common Femoral Vein: No evidence of thrombus. Normal compressibility, respiratory phasicity and response to augmentation. Saphenofemoral Junction: No evidence of thrombus. Normal compressibility and flow on color Doppler imaging. Profunda Femoral Vein: No evidence of thrombus. Normal compressibility and flow on color Doppler imaging. Femoral Vein: No evidence of thrombus. Normal compressibility, respiratory phasicity and response to augmentation. Popliteal Vein: No evidence of thrombus. Normal compressibility, respiratory phasicity and response to augmentation. Calf Veins: No evidence of thrombus. Normal compressibility and flow on color Doppler imaging. Superficial Great Saphenous Vein: Not evaluated. Venous Reflux:  Not evaluated. Other Findings:  None. IMPRESSION: No evidence of DVT involving either the right or left lower extremity. Electronically Signed   By: Evangeline Dakin M.D.   On: 03/12/2017 17:10   ASSESSMENT AND PLAN:  Lee Bryant  is a 61 y.o. male with a known history of cellulitis, diabetes, hypertension-has chronic bilateral leg swelling and which is getting worse he had some injuries and ulcers in the past on his legs which is not healing he received antibiotic treatments frequently as outpatient  * Cellulitis and ulcers with diabetes on right leg- -IV vancomycin and Zosyn for now - ID consult appreciated -seen by vascular---no new recommendations -There may be a  component of neurological issue undergoing ?back pain and radiculopathy - We'll also get echocardiogram.  -Doppler studies of lower extremities negaitve  * Diabetes   Continue baseline home medicine and keep on sliding scale coverage.  * Hypertension   Continue home medicines and monitor.  * Pain   Management with hydrocodone and morphine.  Case discussed with Care Management/Social Worker. Management plans discussed with the patient, family and they are in agreement.  CODE STATUS: full  DVT Prophylaxis:lovenox  TOTAL TIME TAKING CARE OF THIS PATIENT: *30* minutes.  >50% time spent on counselling and coordination of care  POSSIBLE D/C IN *1-2* DAYS, DEPENDING ON CLINICAL CONDITION.  Note: This dictation was prepared with Dragon dictation along with smaller phrase technology. Any transcriptional errors that result from this process are unintentional.  Suhail Peloquin M.D on 03/13/2017 at 2:20 PM  Between 7am to 6pm - Pager - [phone]  After 6pm go to [email or web address] - password [email or web address]  Office  [phone]  CC: Primary care physician; Tracie Harrier, MD

## 2017-03-14 ENCOUNTER — Inpatient Hospital Stay
Admission: AD | Admit: 2017-03-14 | Payer: BLUE CROSS/BLUE SHIELD | Source: Ambulatory Visit | Admitting: Internal Medicine

## 2017-03-14 ENCOUNTER — Inpatient Hospital Stay
Admission: AD | Admit: 2017-03-14 | Discharge: 2017-03-14 | Disposition: A | Discharge status: Home / Self Care | Payer: BLUE CROSS/BLUE SHIELD | Source: Ambulatory Visit | Attending: Internal Medicine | Admitting: Internal Medicine

## 2017-03-14 DIAGNOSIS — L97312 Non-pressure chronic ulcer of right ankle with fat layer exposed: Secondary | ICD-10-CM

## 2017-03-14 DIAGNOSIS — R600 Localized edema: Secondary | ICD-10-CM

## 2017-03-14 DIAGNOSIS — L089 Local infection of the skin and subcutaneous tissue, unspecified: Secondary | ICD-10-CM

## 2017-03-14 DIAGNOSIS — E11628 Type 2 diabetes mellitus with other skin complications: Secondary | ICD-10-CM

## 2017-03-14 LAB — GLUCOSE, CAPILLARY
GLUCOSE-CAPILLARY: 200 mg/dL — AB (ref 65–99)
Glucose-Capillary: 136 mg/dL — ABNORMAL HIGH (ref 65–99)

## 2017-03-14 LAB — ECHOCARDIOGRAM COMPLETE
AVAREAVTI: 3.32 cm2
AVPG: 9 mmHg
AVPKVEL: 152 cm/s
Ao pk vel: 0.63 m/s
Ao-asc: 41 cm
CHL CUP AV PEAK INDEX: 1.36
FS: 24 % — AB (ref 28–44)
Height: 73 in
IV/PV OW: 0.79
LA ID, A-P, ES: 37 mm
LA vol A4C: 57.2 ml
LADIAMINDEX: 1.51 cm/m2
LDCA: 5.31 cm2
LEFT ATRIUM END SYS DIAM: 37 mm
LV PW d: 14.8 mm — AB (ref 0.6–1.1)
LVOT diameter: 26 mm
LVOT peak vel: 95.1 cm/s
MVPG: 6 mmHg
MVPKEVEL: 123 m/s
WEIGHTICAEL: 4336 [oz_av]

## 2017-03-14 MED ORDER — FUROSEMIDE 40 MG PO TABS
40.0000 mg | ORAL_TABLET | Freq: Every day | ORAL | Status: DC
  Administered 2017-03-14: 40 mg via ORAL
  Filled 2017-03-14: qty 1

## 2017-03-14 MED ORDER — PERFLUTREN LIPID MICROSPHERE
1.0000 mL | INTRAVENOUS | Status: AC | PRN
  Administered 2017-03-14: 2.5 mL via INTRAVENOUS
  Filled 2017-03-14: qty 10

## 2017-03-14 MED ORDER — AMOXICILLIN-POT CLAVULANATE 875-125 MG PO TABS: 1.0000 | ORAL_TABLET | Freq: Two times a day (BID) | ORAL | 0 refills | Status: DC

## 2017-03-14 MED ORDER — AMOXICILLIN-POT CLAVULANATE 875-125 MG PO TABS
1.0000 | ORAL_TABLET | Freq: Two times a day (BID) | ORAL | Status: DC
  Administered 2017-03-14: 1 via ORAL
  Filled 2017-03-14: qty 1

## 2017-03-14 MED ORDER — FUROSEMIDE 40 MG PO TABS: 40.0000 mg | ORAL_TABLET | Freq: Every day | ORAL | 0 refills | Status: DC

## 2017-03-14 MED ORDER — HYDROCODONE-ACETAMINOPHEN 7.5-325 MG PO TABS
1.0000 | ORAL_TABLET | Freq: Four times a day (QID) | ORAL | Status: DC | PRN
  Administered 2017-03-14: 2 via ORAL
  Filled 2017-03-14: qty 2

## 2017-03-14 MED ORDER — HYDRALAZINE HCL 25 MG PO TABS
25.0000 mg | ORAL_TABLET | Freq: Two times a day (BID) | ORAL | Status: DC
  Administered 2017-03-14: 25 mg via ORAL
  Filled 2017-03-14: qty 1

## 2017-03-14 MED ORDER — GABAPENTIN 300 MG PO CAPS: 600.0000 mg | ORAL_CAPSULE | Freq: Three times a day (TID) | ORAL | 1 refills | Status: DC

## 2017-03-14 MED ORDER — GABAPENTIN 600 MG PO TABS
600.0000 mg | ORAL_TABLET | Freq: Three times a day (TID) | ORAL | Status: DC
  Administered 2017-03-14: 600 mg via ORAL
  Filled 2017-03-14: qty 1

## 2017-03-14 MED ORDER — HYDROCODONE-ACETAMINOPHEN 5-325 MG PO TABS: 1.0000 | ORAL_TABLET | Freq: Every day | ORAL | 0 refills | Status: DC | PRN

## 2017-03-14 NOTE — Care Management Note (Signed)
Case Management Note  Patient Details  Name: Lee Bryant. MRN: [id number] Date of Birth: 06-15-1956  Subjective/Objective:   Lee Bryant has a copy of Dr Selina Cooley wound care instructions from Dr Georgette Shell Progress Note today. After discussing discharge planning with Dr Fritzi Mandes, this writer offered Lee Bryant a choice of home health providers for a HH-RN to supervise/monitor wound and wound care. Lee Bryant declined an offer of [facility] stating that his wife will look after his wound and do any needed care.                  Action/Plan:   Expected Discharge Date:  03/14/17               Expected Discharge Plan:   03/14/17  In-House Referral:     Discharge planning Services   HH-RN  Post Acute Care Choice:   Refused Choice offered to:    Patient DME Arranged:   NA DME Agency:   NA  HH Arranged:   Lee Bryant refused offer of home health RN [facility] Agency:   NA  Status of Service:   Completed  If discussed at [employer] of Stay Meetings, dates discussed:    Additional Comments:  Suhaib Guzzo A, RN 03/14/2017, 12:55 PM

## 2017-03-14 NOTE — Progress Notes (Signed)
Reviewed discharge instruction including meds, scripts, and dressing care. Wife at bedside during discharge. Patient to make follow-up appointment with Dr. Elvina Mattes on Tuesday. Dressing changed this am by MD.  IV removed with cath intact. Waiting for ride at this time. Did answer questions about possible wound vac placement, outpatient.

## 2017-03-14 NOTE — Consult Note (Signed)
Patient Demographics  Lee Bryant, is a 61 y.o. male   MRN: [id number]   DOB - [birth year]  Admit Date - 03/12/2017    Outpatient Primary MD for the patient is Tracie Harrier, MD  Consult requested in the Hospital by Fritzi Mandes, MD, On 03/14/2017    Reason for consult patient has a chronic history of venous stasis ulcers on the right ankle and right lower leg just above the ankle. He states his been going to the wound care center they've tried in the boots which became extremely painful for him and he cannot tolerate them any longer. These also had recent degenerative back disease and has had increasing pain to both lower extremities and lower back as result of that. He states he is more comfortable when he sits up with his feet and legs down which unfortunately does not help his wounds. The always wounds for prolonged period of time over 6 months. He's had debridements and Unna boots which have not helped him. He feels that the [employer] make it worse   With History of -  Past Medical History:  Diagnosis Date  . Cellulitis   . Diabetes mellitus ([place])    type II  . Hypertension       No past surgical history on file.  in for   No chief complaint on file.    HPI  Lee Bryant  is a 61 y.o. male, As mentioned and the reason for consult the has these wounds that are nonhealing and not progressing is admitted to the hospital because of some cellulitis. He is a patient Dr. Joen Laura is also been seen by Dr. Ola Spurr and is also been seen by Dr. Ronalee Belts. Dr. Ronalee Belts feels like the dependent position of his legs is contributing to the wounds and is exacerbated by his lower back problems. He has spoken before too [facility] regarding potential for lymphedema pump as well.    Review of  Systems    In addition to the HPI above,  No Fever-chills, No Headache, No changes with Vision or hearing, No problems swallowing food or Liquids, No Chest pain, Cough or Shortness of Breath, No Abdominal pain, No Nausea or Vommitting, Bowel movements are regular, No Blood in stool or Urine, No dysuria, History of venous stasis dermatitis recently admitted with some cellulitis to the right lower leg. Chronic back pain Burning pain to both lower extremities. Potential for radiculopathy as well as peripheral neuropathy possibly associated with diabetes., No recent weight gain or loss, No polyuria, polydypsia or polyphagia, No significant Mental Stressors.  A full 10 point Review of Systems was done, except as stated above, all other Review of Systems were negative.   Social History Social History  Substance Use Topics  . Smoking status: Former Smoker    Types: Cigarettes  . Smokeless tobacco: Never Used     Comment: when he was 61yrs old.  . Alcohol use Yes     Comment: occassionally.    Family History Family History  Problem Relation Age of Onset  . Cancer Mother   . Hypertension Mother   . Heart attack Father   . Cancer Father   .  Stroke Sister   . Diabetes Brother   . Kidney failure Brother       Anti-infectives    Start     Dose/Rate Route Frequency Ordered Stop   03/14/17 1000  amoxicillin-clavulanate (AUGMENTIN) 875-125 MG per tablet 1 tablet     1 tablet Oral Every 12 hours 03/14/17 0730     03/12/17 2300  vancomycin (VANCOCIN) 1,250 mg in sodium chloride 0.9 % 250 mL IVPB  Status:  Discontinued     1,250 mg 166.7 mL/hr over 90 Minutes Intravenous Every 12 hours 03/12/17 1542 03/14/17 0730   03/12/17 2200  piperacillin-tazobactam (ZOSYN) IVPB 3.375 g  Status:  Discontinued     3.375 g 12.5 mL/hr over 240 Minutes Intravenous Every 8 hours 03/12/17 1530 03/12/17 1537   03/12/17 2200  piperacillin-tazobactam (ZOSYN) IVPB 4.5 g  Status:  Discontinued     4.5  g 25 mL/hr over 240 Minutes Intravenous Every 8 hours 03/12/17 1537 03/12/17 1814   03/12/17 2200  piperacillin-tazobactam (ZOSYN) IVPB 4.5 g  Status:  Discontinued     4.5 g 200 mL/hr over 30 Minutes Intravenous Every 8 hours 03/12/17 1810 03/12/17 1811   03/12/17 2200  piperacillin-tazobactam (ZOSYN) 4.5 g in dextrose 5 % 100 mL IVPB  Status:  Discontinued     4.5 g 25 mL/hr over 240 Minutes Intravenous Every 8 hours 03/12/17 1813 03/14/17 0730   03/12/17 1530  metroNIDAZOLE (FLAGYL) tablet 500 mg  Status:  Discontinued     500 mg Oral Every 8 hours 03/12/17 1517 03/12/17 1529   03/12/17 1430  vancomycin (VANCOCIN) 1,500 mg in sodium chloride 0.9 % 500 mL IVPB     1,500 mg 250 mL/hr over 120 Minutes Intravenous  Once 03/12/17 1405 03/12/17 1711   03/12/17 1430  piperacillin-tazobactam (ZOSYN) IVPB 3.375 g  Status:  Discontinued     3.375 g 12.5 mL/hr over 240 Minutes Intravenous  Once 03/12/17 1413 03/12/17 2106      Scheduled Meds: . amoxicillin-clavulanate  1 tablet Oral Q12H  . aspirin EC  81 mg Oral Daily  . collagenase   Topical Daily  . fenofibrate  160 mg Oral Daily  . furosemide  40 mg Oral Daily  . gabapentin  600 mg Oral TID  . heparin  5,000 Units Subcutaneous Q8H  . hydrALAZINE  25 mg Oral Q12H  . insulin aspart  0-9 Units Subcutaneous TID WC  . metFORMIN  1,000 mg Oral BID WC   Continuous Infusions: PRN Meds:.cyclobenzaprine, docusate sodium, HYDROcodone-acetaminophen  Allergies  Allergen Reactions  . Cephalexin Rash    Physical Exam  Vitals  Blood pressure (!) 165/85, pulse (!) 103, temperature 98 F (36.7 C), temperature source Oral, resp. rate 18, height 6\' 1"  (1.854 m), weight 122.9 kg (271 lb), SpO2 99 %.  Lower Extremity exam:  Vascular:Difficult to palpate arteries due to edema in the foot. His capillary refill 2 seconds to the digits. Patient has significant swelling venous stasis disease to both lower extremities. This may be exacerbated by his  amlodipine usage.  Dermatological: Patient has a large wound on the medial ankle approximately 6 cm x 7 cm with 1.5 cm of depth. Some granulation tissue is noted but still has some fibrous buildup and cellular buildup on the wound base. Patient has a wound on the anterior shin just above the ankle is approximately 3 x 2 cm with 5 mm of depth. Also has a wound at the base of the second third toes since approximately  0.5 x 0.7 and has cellular drainage buildup on the wound does not appear to be infected in usual regions. There is also a wound on the posterior leg that is more superficial that is approximately 2 x 3 cm but no real depth to it at this timeframe. Some redness but cellulitis appears to be improved.  Neurological: Patient has a history of back problems with increasing radiculopathy burning pain to both feet and legs. This causes in the sitting up a lot with his feet down to stay more comfortable. Subsequently was feet down the swelling maintains.  Ortho: No gross deformities this timeframe.  Data Review  CBC  Recent Labs Lab 03/12/17 1354 03/13/17 0332  WBC 13.4* 10.3  HGB 11.5* 11.4*  HCT 33.8* 32.5*  PLT 358 327  MCV 83.3 83.5  MCH 28.4 29.3  MCHC 34.1 35.1  RDW 14.5 14.3   ------------------------------------------------------------------------------------------------------------------  Chemistries   Recent Labs Lab 03/12/17 1354 03/13/17 0332  NA 135 138  K 3.7 3.5  CL 100* 102  CO2 27 27  GLUCOSE 75 70  BUN 21* 16  CREATININE 1.08 1.01  CALCIUM 10.2 9.6  AST 19  --   ALT 17  --   ALKPHOS 41  --   BILITOT 0.4  --    -------------------------------------------------------------------------------Imaging results:   US Venous Img Lower Bilateral  Result Date: 03/12/2017 CLINICAL DATA:  Chronic four-month history of bilateral lower extremity edema which is temporally related to an injury to the right lower extremity. EXAM: BILATERAL LOWER EXTREMITY VENOUS  DOPPLER ULTRASOUND TECHNIQUE: Gray-scale sonography with graded compression, as well as color Doppler and duplex ultrasound were performed to evaluate the lower extremity deep venous systems from the level of the common femoral vein and including the common femoral, femoral, profunda femoral, popliteal and calf veins including the posterior tibial, peroneal and gastrocnemius veins when visible. The superficial great saphenous vein was also interrogated. Spectral Doppler was utilized to evaluate flow at rest and with distal augmentation maneuvers in the common femoral, femoral and popliteal veins. COMPARISON:  None. FINDINGS: RIGHT LOWER EXTREMITY Common Femoral Vein: No evidence of thrombus. Normal compressibility, respiratory phasicity and response to augmentation. Saphenofemoral Junction: No evidence of thrombus. Normal compressibility and flow on color Doppler imaging. Profunda Femoral Vein: No evidence of thrombus. Normal compressibility and flow on color Doppler imaging. Femoral Vein: No evidence of thrombus. Normal compressibility, respiratory phasicity and response to augmentation. Popliteal Vein: No evidence of thrombus. Normal compressibility, respiratory phasicity and response to augmentation. Calf Veins: No evidence of thrombus. Normal compressibility and flow on color Doppler imaging. Superficial Great Saphenous Vein: Not evaluated. Venous Reflux:  Not evaluated. Other Findings:  None. LEFT LOWER EXTREMITY Common Femoral Vein: No evidence of thrombus. Normal compressibility, respiratory phasicity and response to augmentation. Saphenofemoral Junction: No evidence of thrombus. Normal compressibility and flow on color Doppler imaging. Profunda Femoral Vein: No evidence of thrombus. Normal compressibility and flow on color Doppler imaging. Femoral Vein: No evidence of thrombus. Normal compressibility, respiratory phasicity and response to augmentation. Popliteal Vein: No evidence of thrombus. Normal  compressibility, respiratory phasicity and response to augmentation. Calf Veins: No evidence of thrombus. Normal compressibility and flow on color Doppler imaging. Superficial Great Saphenous Vein: Not evaluated. Venous Reflux:  Not evaluated. Other Findings:  None. IMPRESSION: No evidence of DVT involving either the right or left lower extremity. Electronically Signed   By: Evangeline Dakin M.D.   On: 03/12/2017 17:10     Assessment & Plan: Patient clearly  has a worsening of the ulcerations due to continued difficulty in controlling lower extremity edema. He states that the Unna boots were too painful and exacerbated not only pain but worsening of the ulcerations. He has to sit with his legs down in order be comfortable because of his back issues. He seen [facility] and they want to order a lymphedema pump when he is qualified to get one. Coronary and he has had treatment for these wounds for over 6 months. Does not appear to be have an infection at this point but swelling and continued presence of the wounds is an issue.  Plan: I think the patient needs regular compression I had a long discussion with him today was in the room for approximately half an hour. I explained the contribution of the swelling and wise having difficult time controlling it when he has to sit with his legs down and not able to wear compression wrap.  After reviewing his chart and a long discussion with the patient I feel like I want to try a different compression technique and wound dressings in order to see if we get some progression with the wounds. We may have to consider versa jet debridement along with wound VAC application on the medial ankle wound because of its depth. Dressing recommendations: Apply the Santyl ointment thinly to the wounds along with a wet to dry saline gauze then wrapped with Kerlix wrap from the toes to below the knee. After Kerlix is in place use a lymphedema wrap which she should be able  to get with a prescription at a local medical supply store. Recommend he keep the foot elevated. Also recommend he speak with his primary care doctor about discontinuing the amlodipine which is likely adding to the amount of swelling that he has. I will follow him later this week and decide if we need to pursue wound VAC treatment for the ulceration on his medial ankle.  Principal Problem:   Skin ulcer of right ankle with fat layer exposed ([place]) Active Problems:   Diabetic foot infection ([place])  Family Communication: Plan discussed with patient and family  Perry Mount M.D on 03/14/2017 at 10:04 AM  Thank you for the consult, we will follow the patient with you in the Hospital.

## 2017-03-14 NOTE — Progress Notes (Signed)
[facility] [place] NAME: Lee Bryant    MR#:  [id number]  DATE OF BIRTH:  Sep 13, 1956  SUBJECTIVE:   Lot of back pain, shoulder pain  No pain while dressing change of ulcers Pt reports having bad back pain for long time and sees chiropracter as out pt REVIEW OF SYSTEMS:   Review of Systems  Constitutional: Negative for chills, fever and weight loss.  HENT: Negative for ear discharge, ear pain and nosebleeds.   Eyes: Negative for blurred vision, pain and discharge.  Respiratory: Negative for sputum production, shortness of breath, wheezing and stridor.   Cardiovascular: Positive for leg swelling. Negative for chest pain, palpitations, orthopnea and PND.  Gastrointestinal: Negative for abdominal pain, diarrhea, nausea and vomiting.  Genitourinary: Negative for frequency and urgency.  Musculoskeletal: Negative for back pain and joint pain.  Neurological: Positive for weakness. Negative for sensory change, speech change and focal weakness.  Psychiatric/Behavioral: Negative for depression and hallucinations. The patient is not nervous/anxious.    Tolerating Diet:yes Tolerating PT: ambulatory  DRUG ALLERGIES:   Allergies  Allergen Reactions  . Cephalexin Rash    VITALS:  Blood pressure (!) 146/92, pulse (!) 106, temperature 98.9 F (37.2 C), temperature source Oral, resp. rate 18, height 6\' 1"  (1.854 m), weight 122.9 kg (271 lb), SpO2 94 %.  PHYSICAL EXAMINATION:   Physical Exam  GENERAL:  61 y.o.-year-old patient lying in the bed with no acute distress. Obese EYES: Pupils equal, round, reactive to light and accommodation. No scleral icterus. Extraocular muscles intact.  HEENT: Head atraumatic, normocephalic. Oropharynx and nasopharynx clear.  NECK:  Supple, no jugular venous distention. No thyroid enlargement, no tenderness.  LUNGS: Normal breath sounds bilaterally, no wheezing, rales, rhonchi. No use of accessory  muscles of respiration.  CARDIOVASCULAR: S1, S2 normal. No murmurs, rubs, or gallops.  ABDOMEN: Soft, nontender, nondistended. Bowel sounds present. No organomegaly or mass.  EXTREMITIES:bilateral leg edema with nonhealing large ulcers on the right leg NEUROLOGIC: Cranial nerves II through XII are intact. No focal Motor or sensory deficits b/l.   PSYCHIATRIC:  patient is alert and oriented x 3.  SKIN: No obvious rash, lesion, or ulcer.   LABORATORY PANEL:  CBC  Recent Labs Lab 03/13/17 0332  WBC 10.3  HGB 11.4*  HCT 32.5*  PLT 327    Chemistries   Recent Labs Lab 03/12/17 1354 03/13/17 0332  NA 135 138  K 3.7 3.5  CL 100* 102  CO2 27 27  GLUCOSE 75 70  BUN 21* 16  CREATININE 1.08 1.01  CALCIUM 10.2 9.6  AST 19  --   ALT 17  --   ALKPHOS 41  --   BILITOT 0.4  --    Cardiac Enzymes No results for input(s): TROPONINI in the last 168 hours. RADIOLOGY:  US Venous Img Lower Bilateral  Result Date: 03/12/2017 CLINICAL DATA:  Chronic four-month history of bilateral lower extremity edema which is temporally related to an injury to the right lower extremity. EXAM: BILATERAL LOWER EXTREMITY VENOUS DOPPLER ULTRASOUND TECHNIQUE: Gray-scale sonography with graded compression, as well as color Doppler and duplex ultrasound were performed to evaluate the lower extremity deep venous systems from the level of the common femoral vein and including the common femoral, femoral, profunda femoral, popliteal and calf veins including the posterior tibial, peroneal and gastrocnemius veins when visible. The superficial great saphenous vein was also interrogated. Spectral Doppler was utilized to evaluate flow at rest and with distal  augmentation maneuvers in the common femoral, femoral and popliteal veins. COMPARISON:  None. FINDINGS: RIGHT LOWER EXTREMITY Common Femoral Vein: No evidence of thrombus. Normal compressibility, respiratory phasicity and response to augmentation. Saphenofemoral Junction:  No evidence of thrombus. Normal compressibility and flow on color Doppler imaging. Profunda Femoral Vein: No evidence of thrombus. Normal compressibility and flow on color Doppler imaging. Femoral Vein: No evidence of thrombus. Normal compressibility, respiratory phasicity and response to augmentation. Popliteal Vein: No evidence of thrombus. Normal compressibility, respiratory phasicity and response to augmentation. Calf Veins: No evidence of thrombus. Normal compressibility and flow on color Doppler imaging. Superficial Great Saphenous Vein: Not evaluated. Venous Reflux:  Not evaluated. Other Findings:  None. LEFT LOWER EXTREMITY Common Femoral Vein: No evidence of thrombus. Normal compressibility, respiratory phasicity and response to augmentation. Saphenofemoral Junction: No evidence of thrombus. Normal compressibility and flow on color Doppler imaging. Profunda Femoral Vein: No evidence of thrombus. Normal compressibility and flow on color Doppler imaging. Femoral Vein: No evidence of thrombus. Normal compressibility, respiratory phasicity and response to augmentation. Popliteal Vein: No evidence of thrombus. Normal compressibility, respiratory phasicity and response to augmentation. Calf Veins: No evidence of thrombus. Normal compressibility and flow on color Doppler imaging. Superficial Great Saphenous Vein: Not evaluated. Venous Reflux:  Not evaluated. Other Findings:  None. IMPRESSION: No evidence of DVT involving either the right or left lower extremity. Electronically Signed   By: Evangeline Dakin M.D.   On: 03/12/2017 17:10   ASSESSMENT AND PLAN:  Lee Bryant  is a 61 y.o. male with a known history of cellulitis, diabetes, hypertension-has chronic bilateral leg swelling and which is getting worse he had some injuries and ulcers in the past on his legs which is not healing he received antibiotic treatments frequently as outpatient  * Cellulitis and ulcers with diabetes on right leg -IV  vancomycin and Zosyn ---change to oral augmenting - ID consult appreciated -seen by vascular---no new recommendations appears djd of back  -There may be a component of neurological issue undergoing ?back pain and radiculopathy - pending echocardiogram.  -Doppler studies of lower extremities negaitve -podiatry evaluation today. -Pt was seeing dr Dellia [facility] wound clinic but does not want to go back there. 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 -d/w pt and wife to repeat MRI spine---pt declined. -recommended see ortho spine as out pt---pt declined. He wants to see the chiropracter -TOLd him not many options left -gabapentin 600 mg tid (recently started by Dr Ginette Pitman)  * Diabetes   Continue baseline home medicine and keep on sliding scale coverage.  * Hypertension   Continue home medicines and monitor.  Will d/c  Later to home  Case discussed with Care Management/Social Worker. Management plans discussed with the patient, family and they are in agreement.  CODE STATUS: full  DVT Prophylaxis:lovenox  TOTAL TIME TAKING CARE OF THIS PATIENT: *30* minutes.  >50% time spent on counselling and coordination of care  POSSIBLE D/C  TODAY, DEPENDING ON CLINICAL CONDITION.  Note: This dictation was prepared with Dragon dictation along with smaller phrase technology. Any transcriptional errors that result from this process are unintentional.  Lee Bryant M.D on 03/14/2017 at 7:34 AM  Between 7am to 6pm - Pager - [phone]  After 6pm go to [email or web address] - password [email or web address]  [employer]  Office  [phone]  CC: Primary care physician; Tracie Harrier, MD

## 2017-03-14 NOTE — Progress Notes (Signed)
*  PRELIMINARY RESULTS* Echocardiogram 2D Echocardiogram has been performed. Definity Contrast used on this study.  Lee Bryant 03/14/2017, 10:58 AM

## 2017-03-14 NOTE — Discharge Summary (Signed)
[facility] [place] NAME: Lee Bryant    MR#:  [id number]  DATE OF BIRTH:  03-Oct-1956  DATE OF ADMISSION:  03/12/2017 ADMITTING PHYSICIAN: Lee Basta, MD  DATE OF DISCHARGE: 03/14/2017  PRIMARY CARE PHYSICIAN: Lee Harrier, MD    ADMISSION DIAGNOSIS:  leg swelling  DISCHARGE DIAGNOSIS:  Bilateral leg edema(chronic) Right LE ulcers ----to f/u dr Lee Bryant Chronic DJD SECONDARY DIAGNOSIS:   Past Medical History:  Diagnosis Date  . Cellulitis   . Diabetes mellitus ([place])    type II  . Hypertension     HOSPITAL COURSE:  ErnestMonaghanis a 61 y.o.malewith a known history of cellulitis, diabetes, hypertension-has chronic bilateral leg swelling and which is getting worse he had some injuries and ulcers in the past on his legs which is not healing he received antibiotic treatments frequently as outpatient  * chronic right Leg ulcers with diabetes  -IV vancomycin and Zosyn ---change to oral augmentin - ID consult appreciated -seen by vascular---no new recommendations appears djd of back  -There may be a component of neurological issue undergoing ?back pain and radiculopathy -pending echocardiogram.  -Doppler studies of lower extremities negaitve -podiatry evaluation today. -Pt was seeing dr Lee Bryant wound clinic but does not want to go back there. -will resume lasix 40 mg daily -Seen by Dr Lee Bryant and recommends dressing changes kerlix wrap and f/u as out pt. Spoke with wife to make appt for f/u  * bilateral leg apin, back pain and shoulder pain appears due to DjD -mri of cervical and Lumbar spine in 2012 reviewed that has shown significant DJD with disc buldge and foraminal narrowing. -d/w pt and wife to repeat MRI spine---pt declined. -recommended see ortho spine as out pt---pt declined. He wants to see the chiropracter -TOLd him not many options left -gabapentin 600 mg tid (recently started by Dr  Lee Bryant)  * Diabetes Continue baseline home medicine and keep on sliding scale coverage.  * Hypertension Continue home medicines and monitor. Echo done---f/u results with Dr Lee Bryant  Will d/c  Later to home  CONSULTS OBTAINED:  Treatment Team:  Lee Ramsay, MD Bryant, Lee Lory, MD Lee Bryant, Lee Bryant, [place]  DRUG ALLERGIES:   Allergies  Allergen Reactions  . Cephalexin Rash    DISCHARGE MEDICATIONS:   Current Discharge Medication List    START taking these medications   Details  amoxicillin-clavulanate (AUGMENTIN) 875-125 MG tablet Take 1 tablet by mouth every 12 (twelve) hours. Qty: 20 tablet, Refills: 0    furosemide (LASIX) 40 MG tablet Take 1 tablet (40 mg total) by mouth daily. Qty: 30 tablet, Refills: 0      CONTINUE these medications which have CHANGED   Details  !! gabapentin (NEURONTIN) 300 MG capsule Take 2 capsules (600 mg total) by mouth 3 (three) times daily. Qty: 180 capsule, Refills: 1    HYDROcodone-acetaminophen (NORCO/VICODIN) 5-325 MG tablet Take 1 tablet by mouth daily as needed. Qty: 30 tablet, Refills: 0     !! - Potential duplicate medications found. Please discuss with provider.    CONTINUE these medications which have NOT CHANGED   Details  aspirin EC 81 MG tablet Take 81 mg by mouth daily.    collagenase (SANTYL) ointment Apply topically.    cyclobenzaprine (FLEXERIL) 10 MG tablet     fenofibrate (TRICOR) 48 MG tablet Take 48 mg by mouth daily.    !! gabapentin (NEURONTIN) 300 MG capsule     glimepiride (AMARYL) 4 MG tablet Take  4 mg by mouth 2 (two) times daily.    ibuprofen (ADVIL,MOTRIN) 800 MG tablet Take 800 mg by mouth daily as needed.    metFORMIN (GLUCOPHAGE) 1000 MG tablet Take 1,000 mg by mouth 2 (two) times daily.    valsartan-hydrochlorothiazide (DIOVAN-HCT) 320-12.5 MG tablet TAKE ONE (1) TABLET EACH DAY    hydrALAZINE (APRESOLINE) 25 MG tablet Take 25 mg by mouth daily.    lidocaine (XYLOCAINE) 2 %  jelly      !! - Potential duplicate medications found. Please discuss with provider.    STOP taking these medications     amLODipine (NORVASC) 10 MG tablet      ciprofloxacin (CIPRO) 750 MG tablet      metroNIDAZOLE (FLAGYL) 500 MG tablet         If you experience worsening of your admission symptoms, develop shortness of breath, life threatening emergency, suicidal or homicidal thoughts you must seek medical attention immediately by calling 911 or calling your MD immediately  if symptoms less severe.  You Must read complete instructions/literature along with all the possible adverse reactions/side effects for all the Medicines you take and that have been prescribed to you. Take any new Medicines after you have completely understood and accept all the possible adverse reactions/side effects.   Please note  You were cared for by a hospitalist during your hospital stay. If you have any questions about your discharge medications or the care you received while you were in the hospital after you are discharged, you can call the unit and asked to speak with the hospitalist on call if the hospitalist that took care of you is not available. Once you are discharged, your primary care physician will handle any further medical issues. Please note that NO REFILLS for any discharge medications will be authorized once you are discharged, as it is imperative that you return to your primary care physician (or establish a relationship with a primary care physician if you do not have one) for your aftercare needs so that they can reassess your need for medications and monitor your lab values. Today   SUBJECTIVE   Feels some better  VITAL SIGNS:  Blood pressure (!) 165/85, pulse (!) 103, temperature 98 F (36.7 C), temperature source Oral, resp. rate 18, height 6\' 1"  (1.854 m), weight 122.9 kg (271 lb), SpO2 99 %.  I/O:   Intake/Output Summary (Last 24 hours) at 03/14/17 1110 Last data filed at  03/14/17 0953  Gross per 24 hour  Intake             1190 ml  Output                0 ml  Net             1190 ml    PHYSICAL EXAMINATION:  GENERAL:  61 y.o.-year-old patient lying in the bed with no acute distress.  EYES: Pupils equal, round, reactive to light and accommodation. No scleral icterus. Extraocular muscles intact.  HEENT: Head atraumatic, normocephalic. Oropharynx and nasopharynx clear.  NECK:  Supple, no jugular venous distention. No thyroid enlargement, no tenderness.  LUNGS: Normal breath sounds bilaterally, no wheezing, rales,rhonchi or crepitation. No use of accessory muscles of respiration.  CARDIOVASCULAR: S1, S2 normal. No murmurs, rubs, or gallops.  ABDOMEN: Soft, non-tender, non-distended. Bowel sounds present. No organomegaly or mass.  EXTREMITIES: N++ pedal edema, no cyanosis, or clubbing.  NEUROLOGIC: Cranial nerves II through XII are intact. Muscle strength 5/5 in all extremities. Sensation  intact. Gait not checked.  PSYCHIATRIC: The patient is alert and oriented x 3.  SKIN: No obvious rash, lesion Right LE ulcers--dressing +, chronic  DATA REVIEW:   CBC   Recent Labs Lab 03/13/17 0332  WBC 10.3  HGB 11.4*  HCT 32.5*  PLT 327    Chemistries   Recent Labs Lab 03/12/17 1354 03/13/17 0332  NA 135 138  K 3.7 3.5  CL 100* 102  CO2 27 27  GLUCOSE 75 70  BUN 21* 16  CREATININE 1.08 1.01  CALCIUM 10.2 9.6  AST 19  --   ALT 17  --   ALKPHOS 41  --   BILITOT 0.4  --     Microbiology Results   Recent Results (from the past 240 hour(s))  MRSA PCR Screening     Status: None   Collection Time: 03/12/17  1:26 PM  Result Value Ref Range Status   MRSA by PCR NEGATIVE NEGATIVE Final    Comment:        The GeneXpert MRSA Assay (FDA approved for NASAL specimens only), is one component of a comprehensive MRSA colonization surveillance program. It is not intended to diagnose MRSA infection nor to guide or monitor treatment for MRSA  infections.   Aerobic Culture (superficial specimen)     Status: None (Preliminary result)   Collection Time: 03/12/17  6:00 PM  Result Value Ref Range Status   Specimen Description ANKLE RIGHT ANKLE AND BACK OF ANKLE ULCERS  Final   Special Requests Normal  Final   Gram Stain   Final    MODERATE WBC PRESENT, PREDOMINANTLY PMN ABUNDANT GRAM NEGATIVE RODS MODERATE GRAM POSITIVE COCCI IN PAIRS IN CLUSTERS FEW GRAM POSITIVE RODS    Culture   Final    ABUNDANT STAPHYLOCOCCUS AUREUS SUSCEPTIBILITIES TO FOLLOW WITHIN MIXED CULTURE Performed at [facility], [place] [address] [place], [place] [address]    Report Status PENDING  Incomplete    RADIOLOGY:  US Venous Img Lower Bilateral  Result Date: 03/12/2017 CLINICAL DATA:  Chronic four-month history of bilateral lower extremity edema which is temporally related to an injury to the right lower extremity. EXAM: BILATERAL LOWER EXTREMITY VENOUS DOPPLER ULTRASOUND TECHNIQUE: Gray-scale sonography with graded compression, as well as color Doppler and duplex ultrasound were performed to evaluate the lower extremity deep venous systems from the level of the common femoral vein and including the common femoral, femoral, profunda femoral, popliteal and calf veins including the posterior tibial, peroneal and gastrocnemius veins when visible. The superficial great saphenous vein was also interrogated. Spectral Doppler was utilized to evaluate flow at rest and with distal augmentation maneuvers in the common femoral, femoral and popliteal veins. COMPARISON:  None. FINDINGS: RIGHT LOWER EXTREMITY Common Femoral Vein: No evidence of thrombus. Normal compressibility, respiratory phasicity and response to augmentation. Saphenofemoral Junction: No evidence of thrombus. Normal compressibility and flow on color Doppler imaging. Profunda Femoral Vein: No evidence of thrombus. Normal compressibility and flow on color Doppler imaging. Femoral Vein: No evidence of  thrombus. Normal compressibility, respiratory phasicity and response to augmentation. Popliteal Vein: No evidence of thrombus. Normal compressibility, respiratory phasicity and response to augmentation. Calf Veins: No evidence of thrombus. Normal compressibility and flow on color Doppler imaging. Superficial Great Saphenous Vein: Not evaluated. Venous Reflux:  Not evaluated. Other Findings:  None. LEFT LOWER EXTREMITY Common Femoral Vein: No evidence of thrombus. Normal compressibility, respiratory phasicity and response to augmentation. Saphenofemoral Junction: No evidence of thrombus. Normal compressibility and flow on  color Doppler imaging. Profunda Femoral Vein: No evidence of thrombus. Normal compressibility and flow on color Doppler imaging. Femoral Vein: No evidence of thrombus. Normal compressibility, respiratory phasicity and response to augmentation. Popliteal Vein: No evidence of thrombus. Normal compressibility, respiratory phasicity and response to augmentation. Calf Veins: No evidence of thrombus. Normal compressibility and flow on color Doppler imaging. Superficial Great Saphenous Vein: Not evaluated. Venous Reflux:  Not evaluated. Other Findings:  None. IMPRESSION: No evidence of DVT involving either the right or left lower extremity. Electronically Signed   By: Evangeline Dakin M.D.   On: 03/12/2017 17:10     Management plans discussed with the patient, family and they are in agreement.  CODE STATUS:     Code Status Orders        Start     Ordered   03/12/17 1327  Full code  Continuous     03/12/17 1326    Code Status History    Date Active Date Inactive Code Status Order ID Comments User Context   This patient has a current code status but no historical code status.      TOTAL TIME TAKING CARE OF THIS PATIENT: 40 minutes.    Devaunte Gasparini M.D on 03/14/2017 at 11:10 AM  Between 7am to 6pm - Pager - [phone] After 6pm go to [email or web address] - password [email or web address]  Office  [phone]  CC: Primary care physician; Lee Harrier, MD

## 2017-03-14 NOTE — Discharge Instructions (Signed)
Dressing recommendations: Apply the Santyl ointment thinly to the wounds along with a wet to dry saline gauze then wrapped with Kerlix wrap from the toes to below the knee. After Kerlix is in place use a lymphedema wrap which she should be able to get with a prescription at a local medical supply store. Recommend he keep the foot elevated

## 2017-03-15 LAB — AEROBIC CULTURE  (SUPERFICIAL SPECIMEN)

## 2017-03-15 LAB — AEROBIC CULTURE W GRAM STAIN (SUPERFICIAL SPECIMEN): Special Requests: NORMAL

## 2017-03-17 NOTE — Consult Note (Signed)
[facility] Vascular Consult Note  MRN : [id number]  Lee Bryant. is a 61 y.o. (December 18, 1955) male who presents with chief complaint of No chief complaint on file. Marland Kitchen  History of Present Illness: I am asked to evaluate the patient by Dr. Anselm Jungling for painful swelling associated with venous ulcers. The patient is well known to our service and is currently undergoing treatment for venous ulceration and lymphedema. Recently he was placed in an Haematologist. He claims the [employer] made everything worse. On discussion with the patient he has experienced the abrupt onset of excruciating right lower extremity pain there is some left lower extremity pain as well. He describes it as a sharp or searing radiating pain beginning near the hip and shooting down his leg. Because of this the only comfortable position he has been able to find is in an upright seated position and he has actually been propping himself up with pillows surrounding him so that he can sleep. This is been ongoing for several days. As this has developed he is experienced a tremendous increase in his swelling of his lower extremities and therefore a marked worsening of the drainage from his venous ulcers. This is also the point at which he felt the [place] boot was too constricting and made things worse. He denies fever and chills.  No current facility-administered medications for this encounter.    Current Outpatient Prescriptions  Medication Sig Dispense Refill  . aspirin EC 81 MG tablet Take 81 mg by mouth daily.    . collagenase (SANTYL) ointment Apply topically.    . cyclobenzaprine (FLEXERIL) 10 MG tablet     . fenofibrate (TRICOR) 48 MG tablet Take 48 mg by mouth daily.    Marland Kitchen gabapentin (NEURONTIN) 300 MG capsule     . glimepiride (AMARYL) 4 MG tablet Take 4 mg by mouth 2 (two) times daily.    Marland Kitchen ibuprofen (ADVIL,MOTRIN) 800 MG tablet Take 800 mg by mouth daily as needed.    . metFORMIN (GLUCOPHAGE) 1000  MG tablet Take 1,000 mg by mouth 2 (two) times daily.    . valsartan-hydrochlorothiazide (DIOVAN-HCT) 320-12.5 MG tablet TAKE ONE (1) TABLET EACH DAY    . amoxicillin-clavulanate (AUGMENTIN) 875-125 MG tablet Take 1 tablet by mouth every 12 (twelve) hours. 20 tablet 0  . furosemide (LASIX) 40 MG tablet Take 1 tablet (40 mg total) by mouth daily. 30 tablet 0  . gabapentin (NEURONTIN) 300 MG capsule Take 2 capsules (600 mg total) by mouth 3 (three) times daily. 180 capsule 1  . hydrALAZINE (APRESOLINE) 25 MG tablet Take 25 mg by mouth daily.    Marland Kitchen HYDROcodone-acetaminophen (NORCO/VICODIN) 5-325 MG tablet Take 1 tablet by mouth daily as needed. 30 tablet 0  . lidocaine (XYLOCAINE) 2 % jelly       Past Medical History:  Diagnosis Date  . Cellulitis   . Diabetes mellitus ([place])    type II  . Hypertension     No past surgical history on file.  Social History Social History  Substance Use Topics  . Smoking status: Former Smoker    Types: Cigarettes  . Smokeless tobacco: Never Used     Comment: when he was 61yrs old.  . Alcohol use Yes     Comment: occassionally.    Family History Family History  Problem Relation Age of Onset  . Cancer Mother   . Hypertension Mother   . Heart attack Father   . Cancer Father   .  Stroke Sister   . Diabetes Brother   . Kidney failure Brother   No family history of bleeding/clotting disorders, porphyria or autoimmune disease   Allergies  Allergen Reactions  . Cephalexin Rash     REVIEW OF SYSTEMS (Negative unless checked)  Constitutional: [] Weight loss  [] Fever  [] Chills Cardiac: [] Chest pain   [] Chest pressure   [] Palpitations   [] Shortness of breath when laying flat   [] Shortness of breath at rest   [] Shortness of breath with exertion. Vascular:  [] Pain in legs with walking   [x] Pain in legs at rest   [] Pain in legs when laying flat   [] Claudication   [x] Pain in feet when walking  [] Pain in feet at rest  [] Pain in feet when laying flat    [x] History of DVT   [] Phlebitis   [x] Swelling in legs   [] Varicose veins   [] Non-healing ulcers Pulmonary:   [] Uses home oxygen   [] Productive cough   [] Hemoptysis   [] Wheeze  [] COPD   [] Asthma Neurologic:  [] Dizziness  [] Blackouts   [] Seizures   [] History of stroke   [] History of TIA  [] Aphasia   [] Temporary blindness   [] Dysphagia   [] Weakness or numbness in arms   [] Weakness or numbness in legs Musculoskeletal:  [] Arthritis   [] Joint swelling   [x] Joint pain   [x] Low back pain Hematologic:  [] Easy bruising  [] Easy bleeding   [] Hypercoagulable state   [] Anemic  [] Hepatitis Gastrointestinal:  [] Blood in stool   [] Vomiting blood  [] Gastroesophageal reflux/heartburn   [] Difficulty swallowing. Genitourinary:  [] Chronic kidney disease   [] Difficult urination  [] Frequent urination  [] Burning with urination   [] Blood in urine Skin:  [] Rashes   [] Ulcers   [] Wounds Psychological:  [] History of anxiety   []  History of major depression.  Physical Examination  Vitals:   03/13/17 0731 03/13/17 1509 03/13/17 2324 03/14/17 0715  BP: (!) 141/63 (!) 141/88 (!) 146/92 (!) 165/85  Pulse: 99 100 (!) 106 (!) 103  Resp: 18 18  18   Temp: 98.9 F (37.2 C) 99.5 F (37.5 C) 98.9 F (37.2 C) 98 F (36.7 C)  TempSrc: Oral Oral Oral Oral  SpO2: 96% 99% 94% 99%  Weight:      Height:       Body mass index is 35.75 kg/m. Gen:  WD/WN, NAD Head: [place]/AT, No temporalis wasting. Prominent temp pulse not noted. Ear/Nose/Throat: Hearing grossly intact, nares w/o erythema or drainage, oropharynx w/o Erythema/Exudate Eyes: Sclera non-icteric, conjunctiva clear Neck: Trachea midline.  No JVD.  Pulmonary:  Good air movement, respirations not labored, equal bilaterally.  Cardiac: RRR, normal S1, S2. Vascular: Both lower extremities demonstrate 4+ pitting edema. Venous ulcers are granulating they are draining a straw-colored fluid. There is no odor.  Both feet are pink and warm with brisk capillary refill Vessel Right Left   Radial Palpable Palpable  Brachial Palpable Palpable  PT Not Palpable Not Palpable  DP Not Palpable Not Palpable   Gastrointestinal: soft, non-tender/non-distended. No guarding/reflex.  Musculoskeletal: M/S 5/5 throughout.  Extremities without ischemic changes.  Neurologic: Sensation grossly intact in extremities.  Symmetrical.  Speech is fluent. Motor exam as listed above. Psychiatric: Judgment intact, Mood & affect appropriate for pt's clinical situation. Dermatologic: No rashes or ulcers noted.  No cellulitis or open wounds. Lymph : No Cervical, Axillary, or Inguinal lymphadenopathy.      CBC Lab Results  Component Value Date   WBC 10.3 03/13/2017   HGB 11.4 (L) 03/13/2017   HCT 32.5 (L) 03/13/2017  MCV 83.5 03/13/2017   PLT 327 03/13/2017    BMET    Component Value Date/Time   NA 138 03/13/2017 0332   K 3.5 03/13/2017 0332   CL 102 03/13/2017 0332   CO2 27 03/13/2017 0332   GLUCOSE 70 03/13/2017 0332   BUN 16 03/13/2017 0332   CREATININE 1.01 03/13/2017 0332   CALCIUM 9.6 03/13/2017 0332   GFRNONAA >60 03/13/2017 0332   GFRAA >60 03/13/2017 0332   Estimated Creatinine Clearance: 104.2 mL/min (by C-G formula based on SCr of 1.01 mg/dL).  COAG No results found for: INR, PROTIME  Radiology US Venous Img Lower Bilateral  Result Date: 03/12/2017 CLINICAL DATA:  Chronic four-month history of bilateral lower extremity edema which is temporally related to an injury to the right lower extremity. EXAM: BILATERAL LOWER EXTREMITY VENOUS DOPPLER ULTRASOUND TECHNIQUE: Gray-scale sonography with graded compression, as well as color Doppler and duplex ultrasound were performed to evaluate the lower extremity deep venous systems from the level of the common femoral vein and including the common femoral, femoral, profunda femoral, popliteal and calf veins including the posterior tibial, peroneal and gastrocnemius veins when visible. The superficial great saphenous vein was also  interrogated. Spectral Doppler was utilized to evaluate flow at rest and with distal augmentation maneuvers in the common femoral, femoral and popliteal veins. COMPARISON:  None. FINDINGS: RIGHT LOWER EXTREMITY Common Femoral Vein: No evidence of thrombus. Normal compressibility, respiratory phasicity and response to augmentation. Saphenofemoral Junction: No evidence of thrombus. Normal compressibility and flow on color Doppler imaging. Profunda Femoral Vein: No evidence of thrombus. Normal compressibility and flow on color Doppler imaging. Femoral Vein: No evidence of thrombus. Normal compressibility, respiratory phasicity and response to augmentation. Popliteal Vein: No evidence of thrombus. Normal compressibility, respiratory phasicity and response to augmentation. Calf Veins: No evidence of thrombus. Normal compressibility and flow on color Doppler imaging. Superficial Great Saphenous Vein: Not evaluated. Venous Reflux:  Not evaluated. Other Findings:  None. LEFT LOWER EXTREMITY Common Femoral Vein: No evidence of thrombus. Normal compressibility, respiratory phasicity and response to augmentation. Saphenofemoral Junction: No evidence of thrombus. Normal compressibility and flow on color Doppler imaging. Profunda Femoral Vein: No evidence of thrombus. Normal compressibility and flow on color Doppler imaging. Femoral Vein: No evidence of thrombus. Normal compressibility, respiratory phasicity and response to augmentation. Popliteal Vein: No evidence of thrombus. Normal compressibility, respiratory phasicity and response to augmentation. Calf Veins: No evidence of thrombus. Normal compressibility and flow on color Doppler imaging. Superficial Great Saphenous Vein: Not evaluated. Venous Reflux:  Not evaluated. Other Findings:  None. IMPRESSION: No evidence of DVT involving either the right or left lower extremity. Electronically Signed   By: Evangeline Dakin M.D.   On: 03/12/2017 17:10      Assessment/Plan 1.   Venous ulcers with out-of-control lymphedema  IV vancomycin and Zosyn for now, get MRSA screening and IDhas been asked to see the patient.  Given the patient's history of DVT which will result in chronic venous insufficiency and the recent change in his leg pain which has resulted in dependency for almost 24 hours a day I am not surprised he has had a significant increase in his swelling. By physical examination the ulcers do not appear to be overtly infected. There is no odor the drainage appears to be clear and consistent with lymph fluid and there is no significant erythema surrounding the wounds. I think there is a strong possibility given his severe back problems and back issues in the past that  his acute onset and pain is related to musculoskeletal neurologic issues and this then has caused the change in his positioning which has resulted in the loss of control of his lymphedema and venous insufficiency. It is of paramount importance that he begin elevating and that we aren't able to achieve some sort of compression to allow for control of his venous disease.   There may be a component of neurological issue undergoing, we may need to consider   2.   Diabetes   Continue baseline home medicine and keep on sliding scale coverage.  3.   Hypertension   Continue home medicines and monitor.  4.   Pain of both lower extremities    Management with hydrocodone and morphine. I would strongly encourage evaluation of his lumbar sacral spine given his history and the acute onset of his pain.    Hortencia Pilar, MD  03/17/2017 11:35 AM    This note was created with Dragon medical transcription system.  Any error is purely unintentional

## 2017-03-18 ENCOUNTER — Encounter (INDEPENDENT_AMBULATORY_CARE_PROVIDER_SITE_OTHER): Payer: BLUE CROSS/BLUE SHIELD

## 2017-03-24 ENCOUNTER — Ambulatory Visit (INDEPENDENT_AMBULATORY_CARE_PROVIDER_SITE_OTHER): Payer: BLUE CROSS/BLUE SHIELD | Admitting: Vascular Surgery

## 2017-04-02 DIAGNOSIS — I890 Lymphedema, not elsewhere classified: Secondary | ICD-10-CM | POA: Insufficient documentation

## 2017-05-22 DIAGNOSIS — F419 Anxiety disorder, unspecified: Secondary | ICD-10-CM | POA: Insufficient documentation

## 2017-05-22 DIAGNOSIS — M4800 Spinal stenosis, site unspecified: Secondary | ICD-10-CM | POA: Insufficient documentation

## 2017-05-22 DIAGNOSIS — N179 Acute kidney failure, unspecified: Secondary | ICD-10-CM | POA: Insufficient documentation

## 2017-06-26 DIAGNOSIS — E119 Type 2 diabetes mellitus without complications: Secondary | ICD-10-CM | POA: Insufficient documentation

## 2017-06-26 DIAGNOSIS — L8990 Pressure ulcer of unspecified site, unspecified stage: Secondary | ICD-10-CM | POA: Insufficient documentation

## 2017-06-26 DIAGNOSIS — Z933 Colostomy status: Secondary | ICD-10-CM | POA: Insufficient documentation

## 2018-03-24 DIAGNOSIS — L950 Livedoid vasculitis: Secondary | ICD-10-CM | POA: Insufficient documentation

## 2018-05-02 DIAGNOSIS — Z939 Artificial opening status, unspecified: Secondary | ICD-10-CM | POA: Insufficient documentation

## 2018-05-31 IMAGING — US US EXTREM LOW VENOUS BILAT
1 series · 13 of 24 positions shown · non-contrast
Comparison: None.

CLINICAL DATA: Chronic four-month history of bilateral lower
extremity edema which is temporally related to an injury to the
right lower extremity.



[Series 1: us extrem low venous bilat · 0.09mm/px · 13 of 61 slices shown]
[im 1/61]
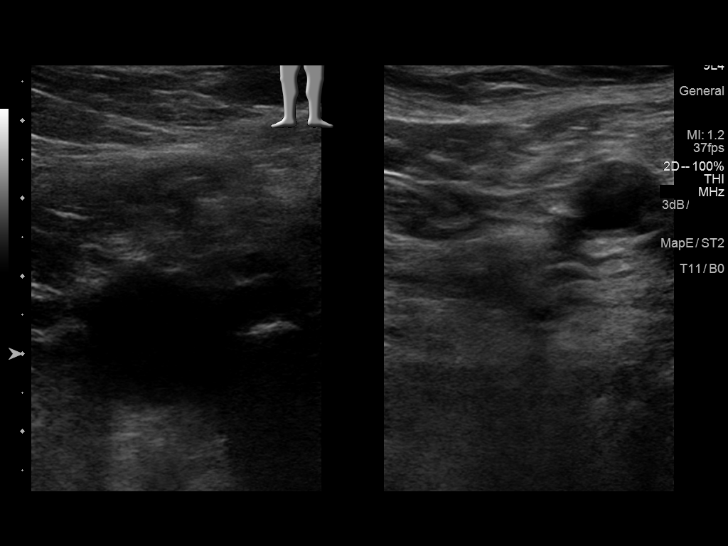
[im 6/61]
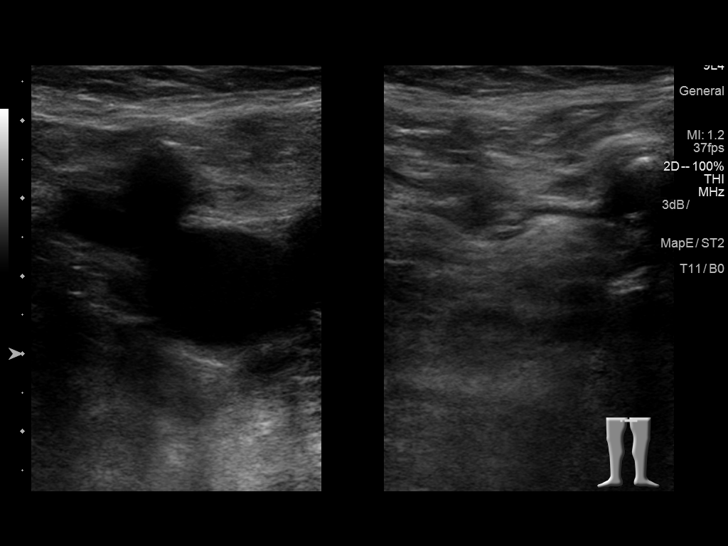
[im 11/61]
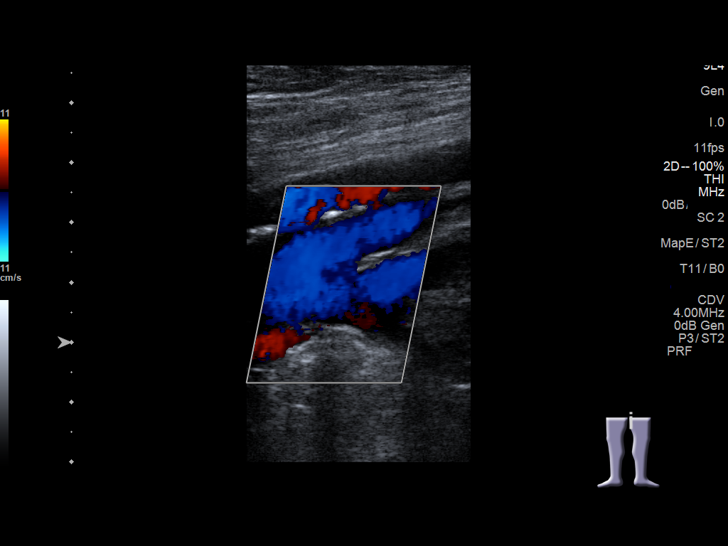
[im 16/61]
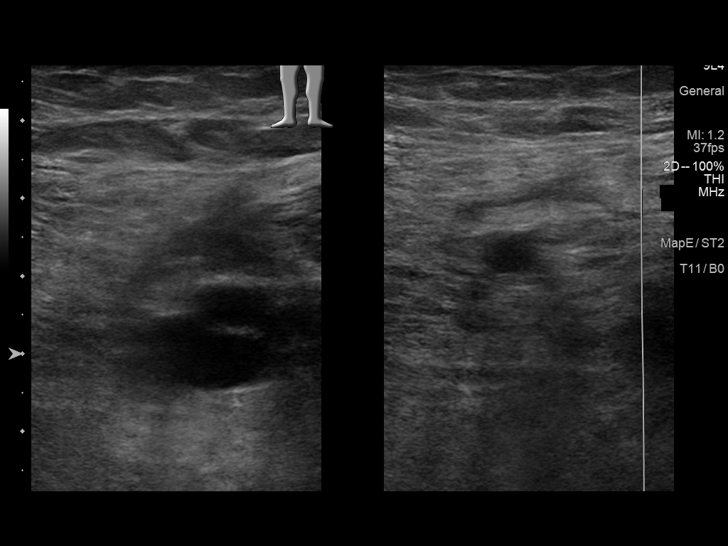
[im 21/61]
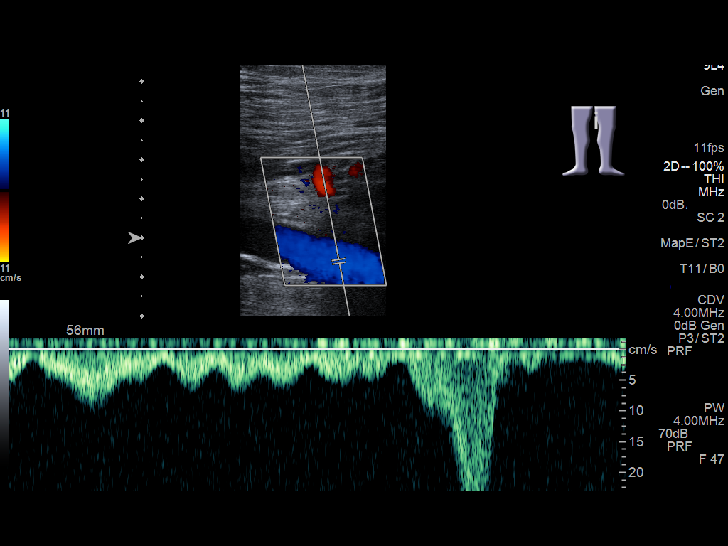
[im 27/61]
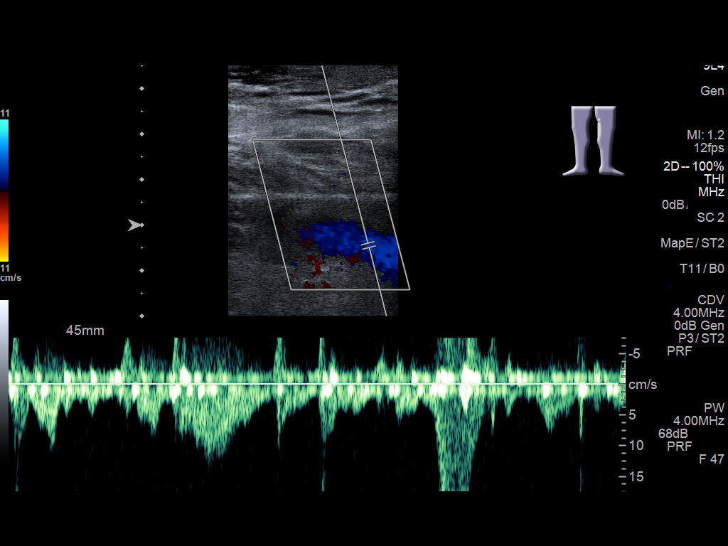
[im 32/61]
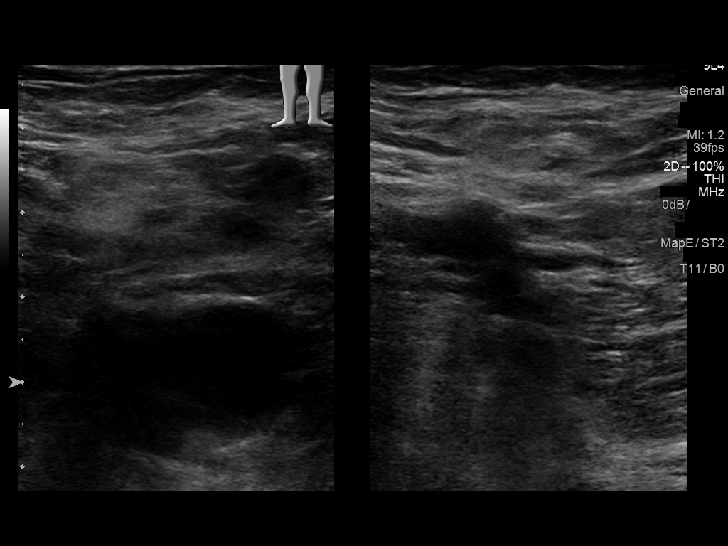
[im 34/61]
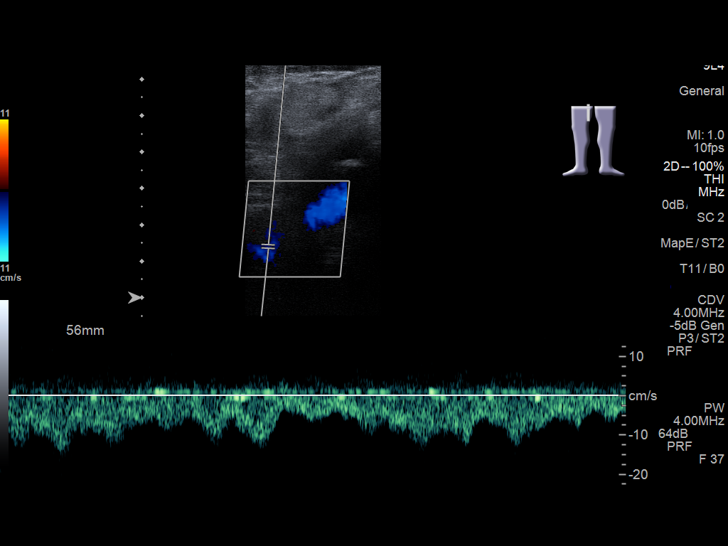
[im 40/61]
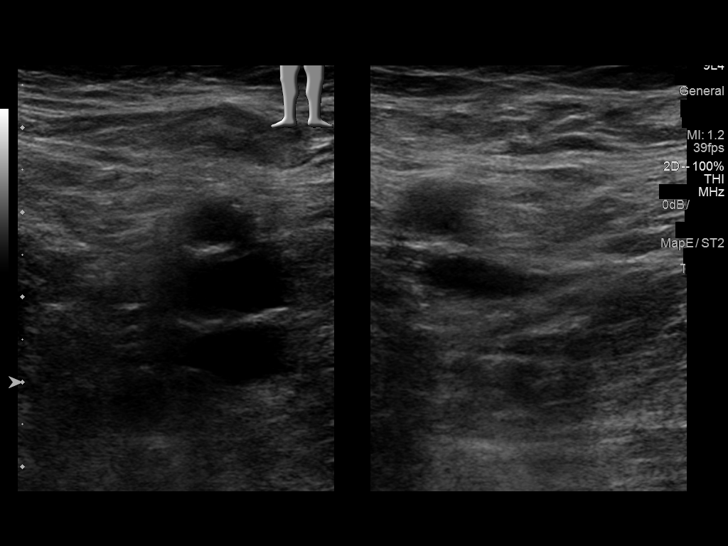
[im 45/61]
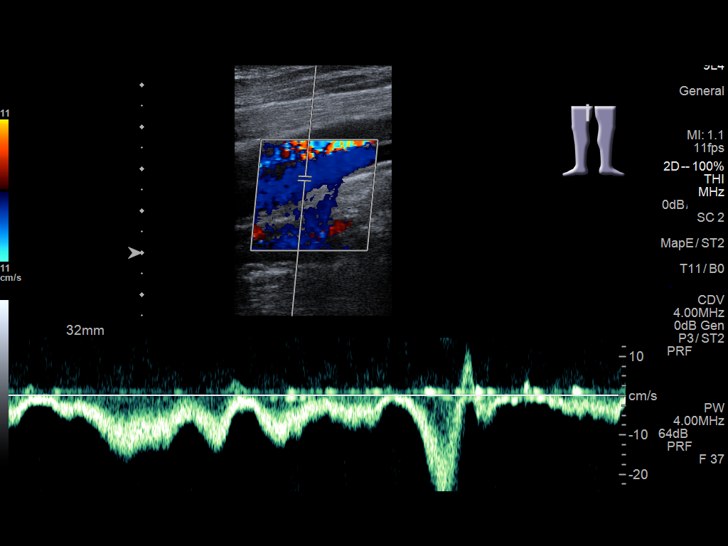
[im 50/61]
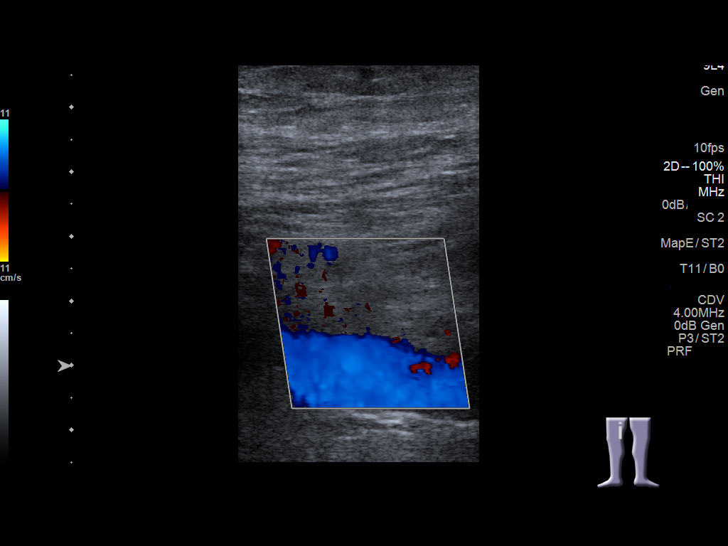
[im 55/61]
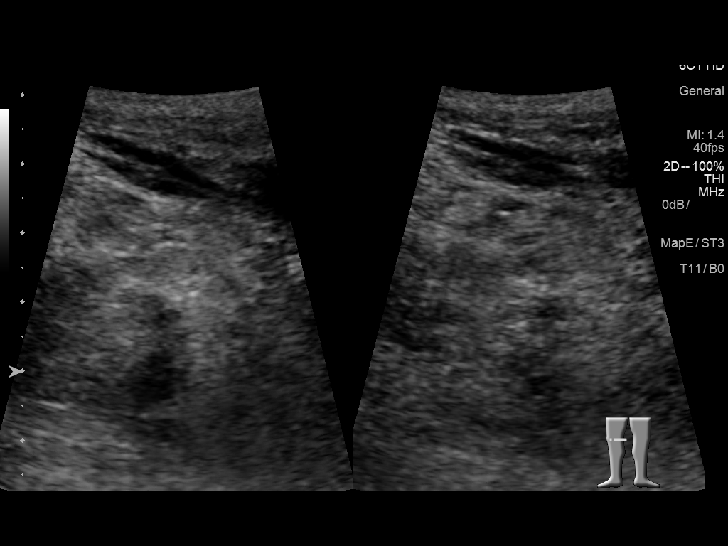
[im 61/61]
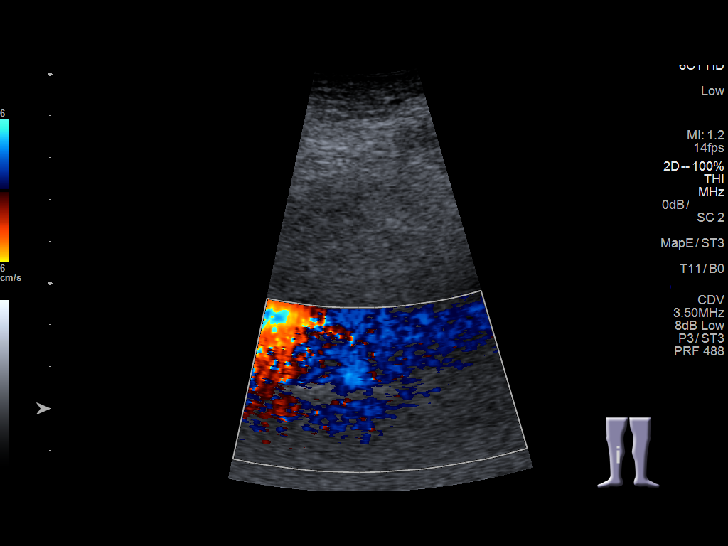

[13 of 24 positions shown; findings below may reference images not displayed]

FINDINGS: RIGHT LOWER EXTREMITY

Common Femoral Vein: No evidence of thrombus. Normal
compressibility, respiratory phasicity and response to augmentation.

Saphenofemoral Junction: No evidence of thrombus. Normal
compressibility and flow on color Doppler imaging.

Profunda Femoral Vein: No evidence of thrombus. Normal
compressibility and flow on color Doppler imaging.

Femoral Vein: No evidence of thrombus. Normal compressibility,
respiratory phasicity and response to augmentation.

Popliteal Vein: No evidence of thrombus. Normal compressibility,
respiratory phasicity and response to augmentation.

Calf Veins: No evidence of thrombus. Normal compressibility and flow
on color Doppler imaging.

Superficial Great Saphenous Vein: Not evaluated.

Venous Reflux:  Not evaluated.

Other Findings:  None.

LEFT LOWER EXTREMITY

Common Femoral Vein: No evidence of thrombus. Normal
compressibility, respiratory phasicity and response to augmentation.

Saphenofemoral Junction: No evidence of thrombus. Normal
compressibility and flow on color Doppler imaging.

Profunda Femoral Vein: No evidence of thrombus. Normal
compressibility and flow on color Doppler imaging.

Femoral Vein: No evidence of thrombus. Normal compressibility,
respiratory phasicity and response to augmentation.

Popliteal Vein: No evidence of thrombus. Normal compressibility,
respiratory phasicity and response to augmentation.

Calf Veins: No evidence of thrombus. Normal compressibility and flow
on color Doppler imaging.

Superficial Great Saphenous Vein: Not evaluated.

Venous Reflux:  Not evaluated.

Other Findings:  None.
IMPRESSION: No evidence of DVT involving either the right or left lower
extremity.

## 2019-03-08 DIAGNOSIS — G5793 Unspecified mononeuropathy of bilateral lower limbs: Secondary | ICD-10-CM | POA: Insufficient documentation

## 2019-06-28 DIAGNOSIS — E782 Mixed hyperlipidemia: Secondary | ICD-10-CM | POA: Insufficient documentation

## 2019-06-28 DIAGNOSIS — R809 Proteinuria, unspecified: Secondary | ICD-10-CM | POA: Insufficient documentation

## 2020-01-11 ENCOUNTER — Other Ambulatory Visit: Payer: Self-pay | Admitting: Nephrology

## 2020-01-11 DIAGNOSIS — I129 Hypertensive chronic kidney disease with stage 1 through stage 4 chronic kidney disease, or unspecified chronic kidney disease: Secondary | ICD-10-CM | POA: Insufficient documentation

## 2020-01-11 DIAGNOSIS — N1832 Chronic kidney disease, stage 3b: Secondary | ICD-10-CM

## 2020-01-11 DIAGNOSIS — R809 Proteinuria, unspecified: Secondary | ICD-10-CM

## 2020-01-11 DIAGNOSIS — N189 Chronic kidney disease, unspecified: Secondary | ICD-10-CM

## 2020-01-11 DIAGNOSIS — R319 Hematuria, unspecified: Secondary | ICD-10-CM

## 2020-01-11 DIAGNOSIS — D631 Anemia in chronic kidney disease: Secondary | ICD-10-CM

## 2020-01-17 ENCOUNTER — Other Ambulatory Visit: Payer: Self-pay

## 2020-01-17 ENCOUNTER — Ambulatory Visit
Admission: RE | Admit: 2020-01-17 | Discharge: 2020-01-17 | Disposition: A | Discharge status: Home / Self Care | Payer: BC Managed Care – PPO | Source: Ambulatory Visit | Attending: Nephrology | Admitting: Nephrology

## 2020-01-17 DIAGNOSIS — R809 Proteinuria, unspecified: Secondary | ICD-10-CM

## 2020-01-17 DIAGNOSIS — N1832 Chronic kidney disease, stage 3b: Secondary | ICD-10-CM | POA: Diagnosis present

## 2020-01-17 DIAGNOSIS — D631 Anemia in chronic kidney disease: Secondary | ICD-10-CM | POA: Diagnosis present

## 2020-01-17 DIAGNOSIS — N189 Chronic kidney disease, unspecified: Secondary | ICD-10-CM | POA: Diagnosis present

## 2020-01-17 DIAGNOSIS — R319 Hematuria, unspecified: Secondary | ICD-10-CM

## 2020-02-23 DIAGNOSIS — N2581 Secondary hyperparathyroidism of renal origin: Secondary | ICD-10-CM | POA: Insufficient documentation

## 2020-02-23 DIAGNOSIS — N185 Chronic kidney disease, stage 5: Secondary | ICD-10-CM | POA: Diagnosis present

## 2020-11-08 IMAGING — US US RENAL
1 series · 14 of 25 positions shown · non-contrast
Comparison: None available.

CLINICAL DATA: Initial evaluation for stage III B chronic kidney
disease. Proteinuria.

EXAM:
RENAL / URINARY TRACT ULTRASOUND COMPLETE

[Series 1: us renal · 0.31mm/px · 14 of 35 slices shown]
[im 1/35]
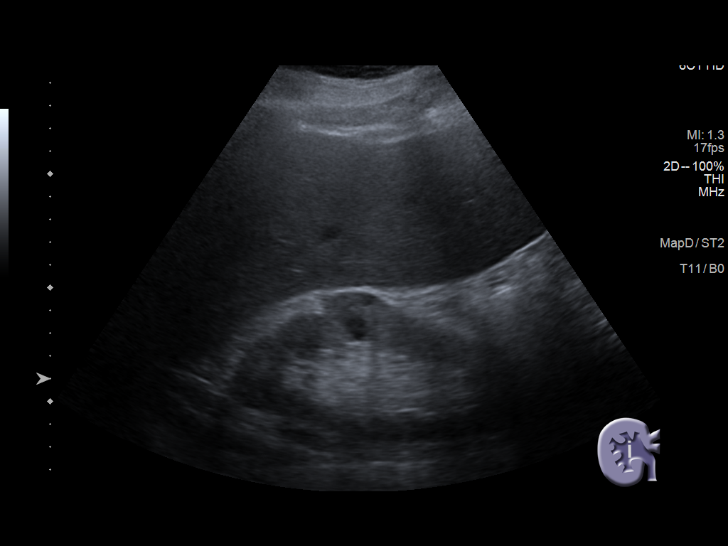
[im 3/35]
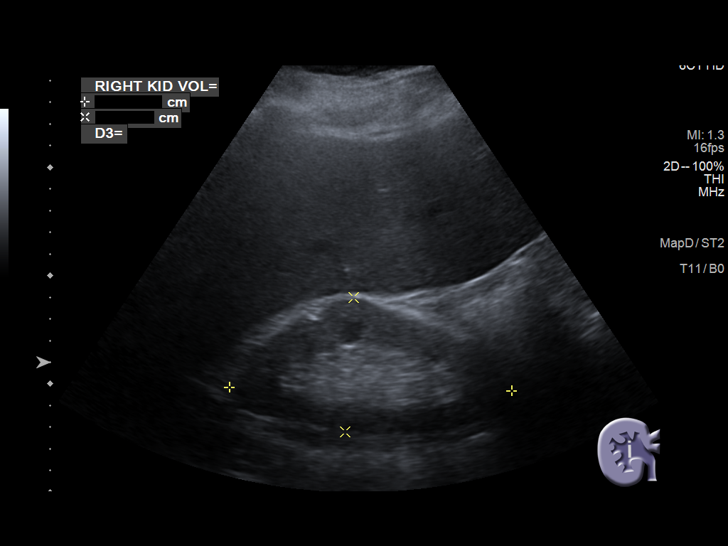
[im 6/35]
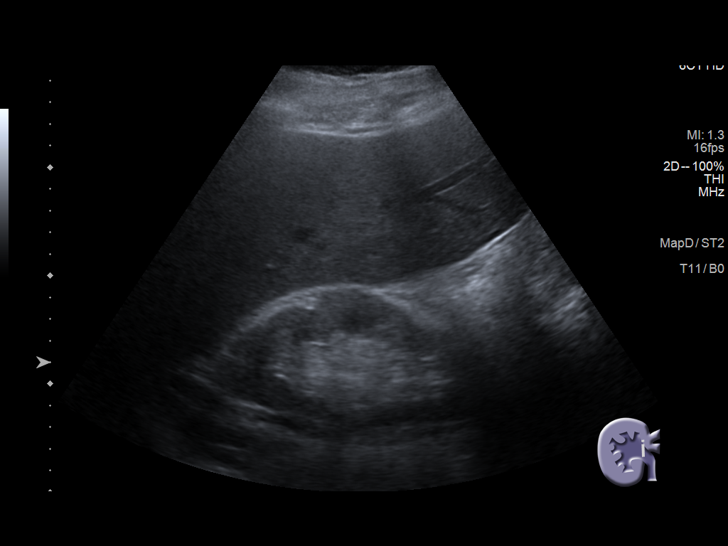
[im 9/35]
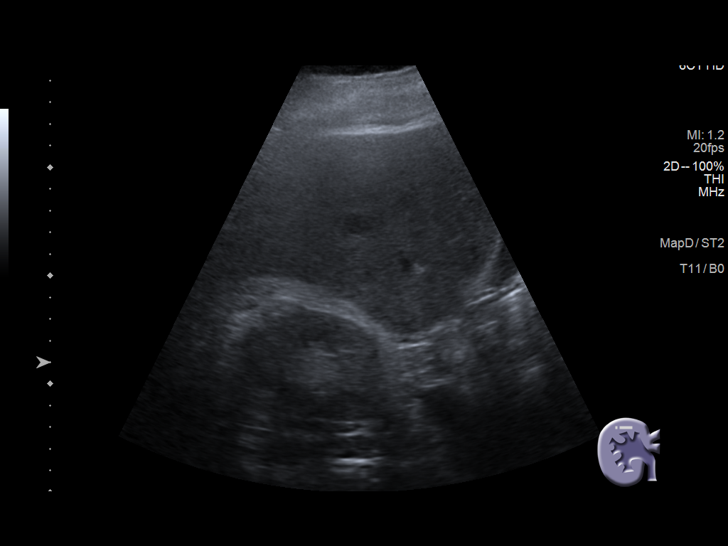
[im 12/35]
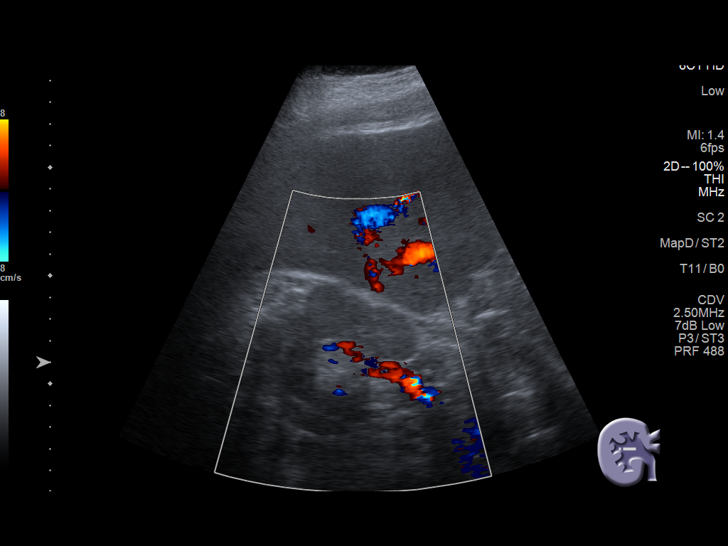
[im 13/35]
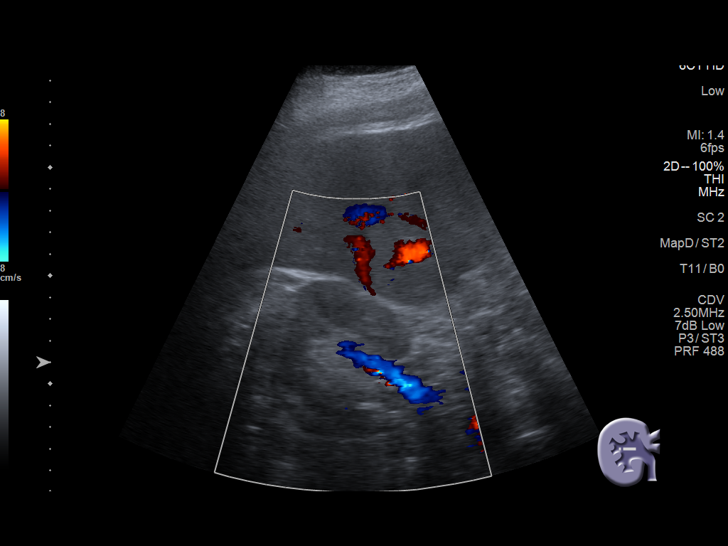
[im 16/35]
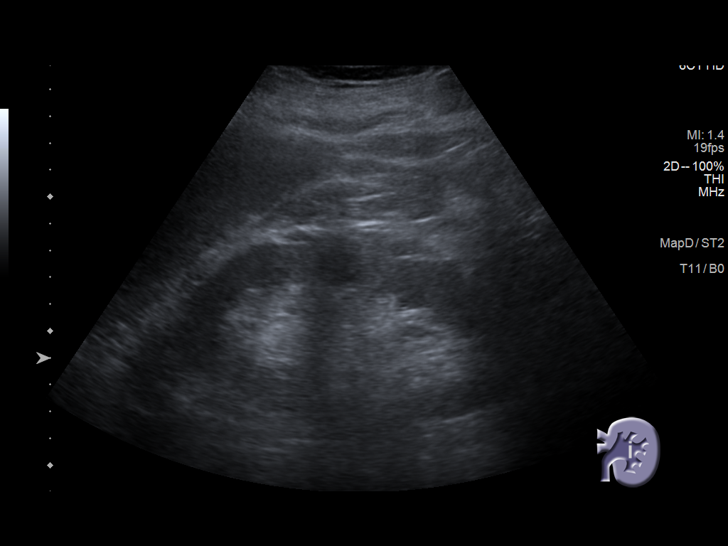
[im 19/35]
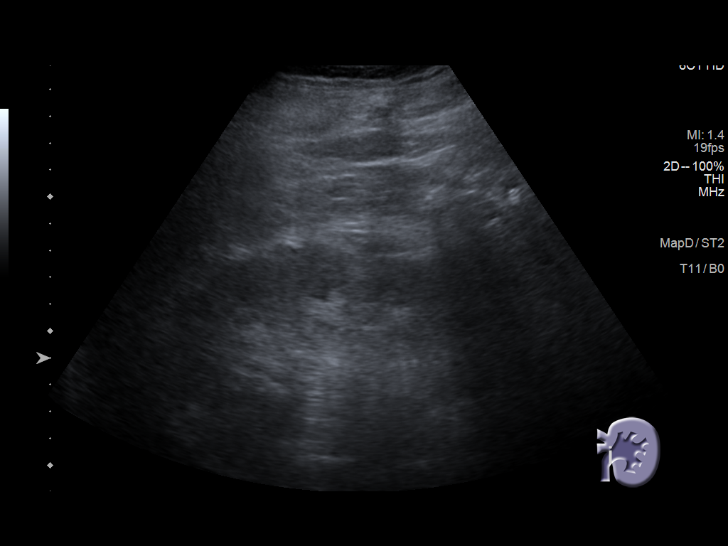
[im 22/35]
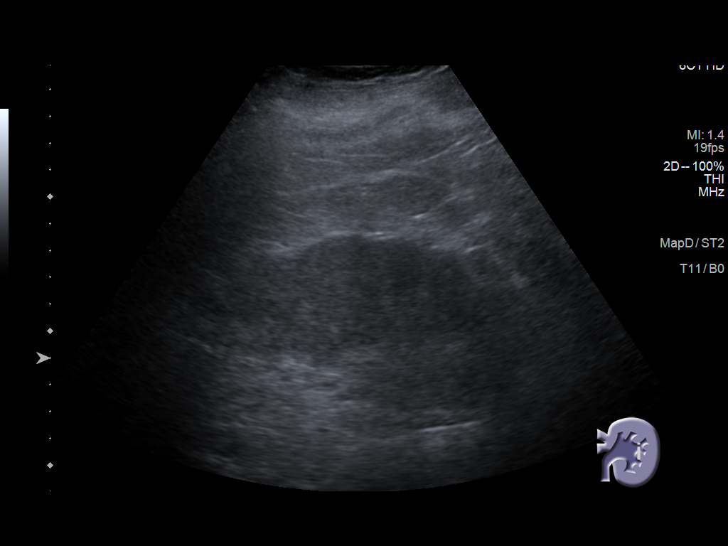
[im 23/35]
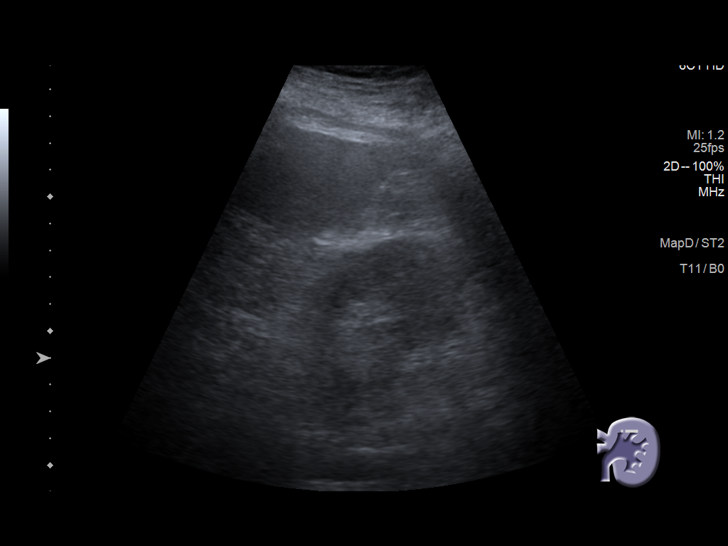
[im 26/35]
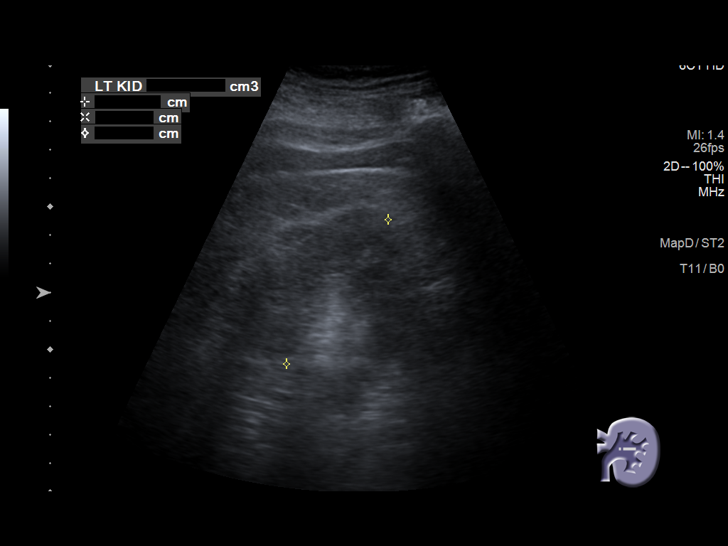
[im 29/35]
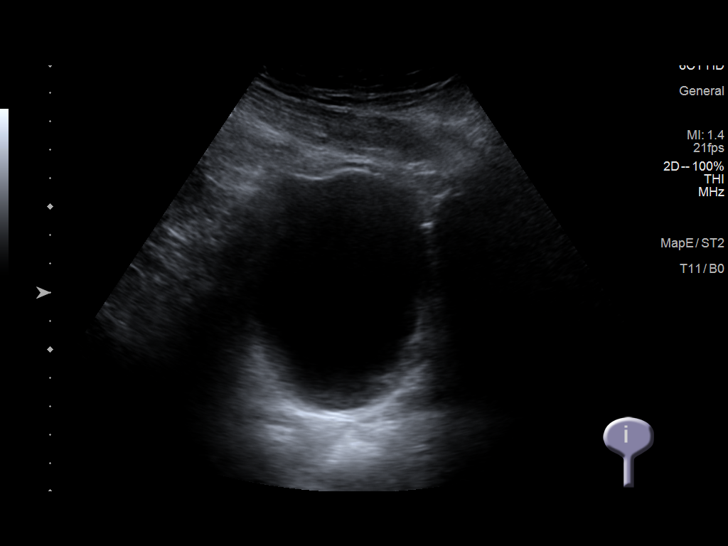
[im 32/35]
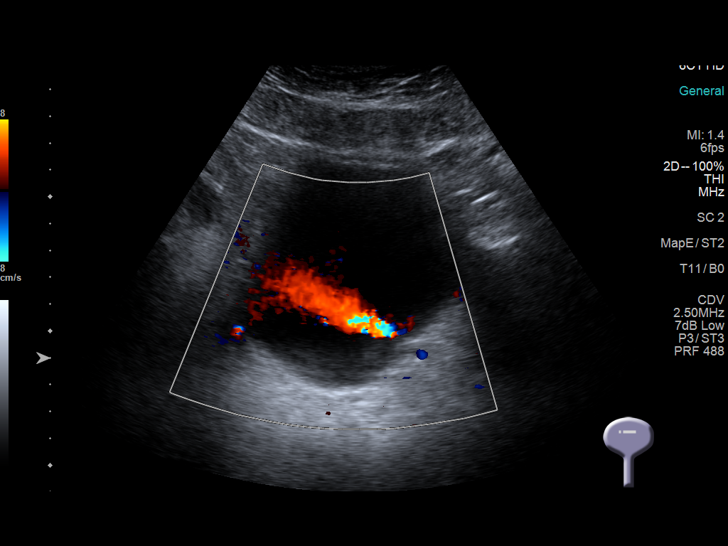
[im 35/35]
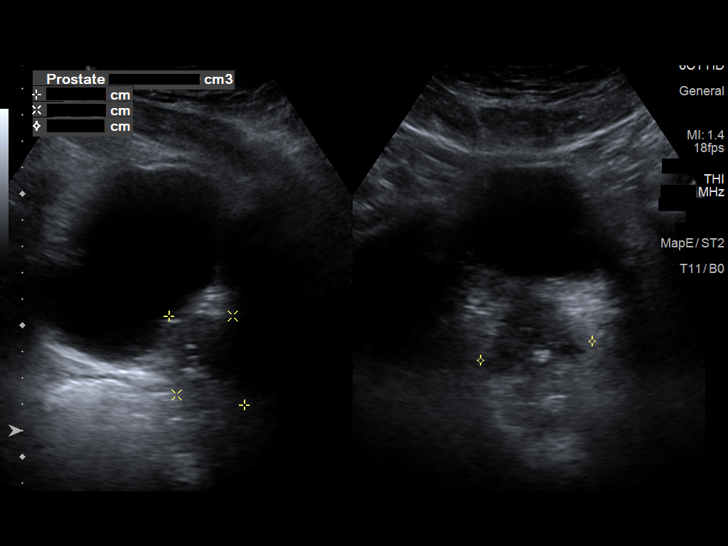

[14 of 25 positions shown; findings below may reference images not displayed]

FINDINGS: Right Kidney:

Renal measurements: 13.0 x 6.2 x 6.9 cm = volume: 294 mL.
Echogenicity within normal limits. No nephrolithiasis
hydronephrosis. No focal renal mass.

Left Kidney:

Renal measurements: 13.1 x 6.5 x 6.2 cm = volume: 275 mL.
Echogenicity within normal limits. No nephrolithiasis or
hydronephrosis. No focal renal mass.

Bladder:

Appears normal for degree of bladder distention. Bilateral jets are
visualized.

Other:

Mildly enlarged prostate measuring 34 cc in volume.
IMPRESSION: 1. Normal renal ultrasound with normal renal echogenicity. No
hydronephrosis.
2. Mildly enlarged prostate.

## 2021-04-30 ENCOUNTER — Other Ambulatory Visit: Payer: Self-pay | Admitting: Internal Medicine

## 2021-04-30 ENCOUNTER — Other Ambulatory Visit (HOSPITAL_COMMUNITY): Payer: Self-pay | Admitting: Internal Medicine

## 2021-04-30 DIAGNOSIS — Z9289 Personal history of other medical treatment: Secondary | ICD-10-CM

## 2021-07-01 DIAGNOSIS — K5790 Diverticulosis of intestine, part unspecified, without perforation or abscess without bleeding: Secondary | ICD-10-CM | POA: Insufficient documentation

## 2022-02-19 ENCOUNTER — Other Ambulatory Visit: Payer: Self-pay | Admitting: Internal Medicine

## 2022-02-19 ENCOUNTER — Ambulatory Visit
Admission: RE | Admit: 2022-02-19 | Discharge: 2022-02-19 | Disposition: A | Discharge status: Home / Self Care | Payer: BC Managed Care – PPO | Source: Ambulatory Visit | Attending: Internal Medicine | Admitting: Internal Medicine

## 2022-02-19 DIAGNOSIS — R042 Hemoptysis: Secondary | ICD-10-CM | POA: Insufficient documentation

## 2022-02-19 DIAGNOSIS — J209 Acute bronchitis, unspecified: Secondary | ICD-10-CM | POA: Diagnosis present

## 2022-02-19 DIAGNOSIS — R600 Localized edema: Secondary | ICD-10-CM

## 2022-02-20 ENCOUNTER — Ambulatory Visit
Admission: RE | Admit: 2022-02-20 | Discharge: 2022-02-20 | Disposition: A | Discharge status: Home / Self Care | Payer: BC Managed Care – PPO | Source: Ambulatory Visit | Attending: Internal Medicine | Admitting: Internal Medicine

## 2022-02-20 ENCOUNTER — Other Ambulatory Visit: Payer: Self-pay | Admitting: Internal Medicine

## 2022-02-20 DIAGNOSIS — J849 Interstitial pulmonary disease, unspecified: Secondary | ICD-10-CM

## 2022-02-20 DIAGNOSIS — R053 Chronic cough: Secondary | ICD-10-CM | POA: Insufficient documentation

## 2022-02-23 ENCOUNTER — Other Ambulatory Visit: Payer: Self-pay | Admitting: Internal Medicine

## 2022-02-23 DIAGNOSIS — R042 Hemoptysis: Secondary | ICD-10-CM

## 2022-05-19 DIAGNOSIS — Z6835 Body mass index (BMI) 35.0-35.9, adult: Secondary | ICD-10-CM | POA: Diagnosis present

## 2022-05-19 DIAGNOSIS — I2510 Atherosclerotic heart disease of native coronary artery without angina pectoris: Secondary | ICD-10-CM | POA: Insufficient documentation

## 2022-07-19 DIAGNOSIS — I214 Non-ST elevation (NSTEMI) myocardial infarction: Secondary | ICD-10-CM

## 2022-07-19 HISTORY — DX: Non-ST elevation (NSTEMI) myocardial infarction: I21.4

## 2022-08-05 ENCOUNTER — Emergency Department: Payer: PPO

## 2022-08-05 ENCOUNTER — Other Ambulatory Visit: Payer: Self-pay

## 2022-08-05 ENCOUNTER — Inpatient Hospital Stay
Admission: EM | Admit: 2022-08-05 | Discharge: 2022-08-10 | DRG: 280 | Disposition: A | Discharge status: Home / Self Care | Payer: PPO | Attending: Internal Medicine | Admitting: Internal Medicine

## 2022-08-05 DIAGNOSIS — E1122 Type 2 diabetes mellitus with diabetic chronic kidney disease: Secondary | ICD-10-CM | POA: Diagnosis present

## 2022-08-05 DIAGNOSIS — I472 Ventricular tachycardia, unspecified: Secondary | ICD-10-CM | POA: Diagnosis present

## 2022-08-05 DIAGNOSIS — Z841 Family history of disorders of kidney and ureter: Secondary | ICD-10-CM

## 2022-08-05 DIAGNOSIS — I5032 Chronic diastolic (congestive) heart failure: Secondary | ICD-10-CM | POA: Diagnosis present

## 2022-08-05 DIAGNOSIS — I2584 Coronary atherosclerosis due to calcified coronary lesion: Secondary | ICD-10-CM | POA: Diagnosis present

## 2022-08-05 DIAGNOSIS — E1121 Type 2 diabetes mellitus with diabetic nephropathy: Secondary | ICD-10-CM | POA: Diagnosis present

## 2022-08-05 DIAGNOSIS — N185 Chronic kidney disease, stage 5: Secondary | ICD-10-CM | POA: Diagnosis not present

## 2022-08-05 DIAGNOSIS — Z8249 Family history of ischemic heart disease and other diseases of the circulatory system: Secondary | ICD-10-CM

## 2022-08-05 DIAGNOSIS — Z79899 Other long term (current) drug therapy: Secondary | ICD-10-CM | POA: Diagnosis not present

## 2022-08-05 DIAGNOSIS — Z823 Family history of stroke: Secondary | ICD-10-CM | POA: Diagnosis not present

## 2022-08-05 DIAGNOSIS — I214 Non-ST elevation (NSTEMI) myocardial infarction: Secondary | ICD-10-CM | POA: Diagnosis present

## 2022-08-05 DIAGNOSIS — Z6836 Body mass index (BMI) 36.0-36.9, adult: Secondary | ICD-10-CM | POA: Diagnosis not present

## 2022-08-05 DIAGNOSIS — Z9889 Other specified postprocedural states: Secondary | ICD-10-CM | POA: Diagnosis not present

## 2022-08-05 DIAGNOSIS — Z833 Family history of diabetes mellitus: Secondary | ICD-10-CM

## 2022-08-05 DIAGNOSIS — Z888 Allergy status to other drugs, medicaments and biological substances status: Secondary | ICD-10-CM

## 2022-08-05 DIAGNOSIS — Z87891 Personal history of nicotine dependence: Secondary | ICD-10-CM

## 2022-08-05 DIAGNOSIS — I10 Essential (primary) hypertension: Secondary | ICD-10-CM | POA: Diagnosis present

## 2022-08-05 DIAGNOSIS — I120 Hypertensive chronic kidney disease with stage 5 chronic kidney disease or end stage renal disease: Secondary | ICD-10-CM | POA: Diagnosis not present

## 2022-08-05 DIAGNOSIS — E8779 Other fluid overload: Secondary | ICD-10-CM | POA: Diagnosis not present

## 2022-08-05 DIAGNOSIS — D631 Anemia in chronic kidney disease: Secondary | ICD-10-CM | POA: Diagnosis present

## 2022-08-05 DIAGNOSIS — F1290 Cannabis use, unspecified, uncomplicated: Secondary | ICD-10-CM | POA: Diagnosis present

## 2022-08-05 DIAGNOSIS — N186 End stage renal disease: Secondary | ICD-10-CM | POA: Diagnosis present

## 2022-08-05 DIAGNOSIS — N179 Acute kidney failure, unspecified: Secondary | ICD-10-CM | POA: Diagnosis present

## 2022-08-05 DIAGNOSIS — E785 Hyperlipidemia, unspecified: Secondary | ICD-10-CM | POA: Diagnosis present

## 2022-08-05 DIAGNOSIS — Z6835 Body mass index (BMI) 35.0-35.9, adult: Secondary | ICD-10-CM | POA: Diagnosis present

## 2022-08-05 DIAGNOSIS — Z7982 Long term (current) use of aspirin: Secondary | ICD-10-CM

## 2022-08-05 DIAGNOSIS — Z7984 Long term (current) use of oral hypoglycemic drugs: Secondary | ICD-10-CM

## 2022-08-05 DIAGNOSIS — Z992 Dependence on renal dialysis: Secondary | ICD-10-CM

## 2022-08-05 DIAGNOSIS — I132 Hypertensive heart and chronic kidney disease with heart failure and with stage 5 chronic kidney disease, or end stage renal disease: Secondary | ICD-10-CM | POA: Diagnosis present

## 2022-08-05 DIAGNOSIS — I25118 Atherosclerotic heart disease of native coronary artery with other forms of angina pectoris: Secondary | ICD-10-CM | POA: Diagnosis present

## 2022-08-05 DIAGNOSIS — N2581 Secondary hyperparathyroidism of renal origin: Secondary | ICD-10-CM | POA: Diagnosis present

## 2022-08-05 DIAGNOSIS — E669 Obesity, unspecified: Secondary | ICD-10-CM | POA: Diagnosis present

## 2022-08-05 DIAGNOSIS — I77810 Thoracic aortic ectasia: Secondary | ICD-10-CM | POA: Diagnosis present

## 2022-08-05 DIAGNOSIS — E119 Type 2 diabetes mellitus without complications: Secondary | ICD-10-CM

## 2022-08-05 HISTORY — DX: Chronic kidney disease, stage 5: N18.5

## 2022-08-05 HISTORY — DX: Atherosclerotic heart disease of native coronary artery without angina pectoris: I25.10

## 2022-08-05 HISTORY — DX: Obesity, unspecified: E66.9

## 2022-08-05 HISTORY — DX: Hyperlipidemia, unspecified: E78.5

## 2022-08-05 HISTORY — DX: Pressure ulcer of sacral region, stage 4: L89.154

## 2022-08-05 LAB — BASIC METABOLIC PANEL
Anion gap: 8 (ref 5–15)
BUN: 76 mg/dL — ABNORMAL HIGH (ref 8–23)
CO2: 21 mmol/L — ABNORMAL LOW (ref 22–32)
Calcium: 9.4 mg/dL (ref 8.9–10.3)
Chloride: 106 mmol/L (ref 98–111)
Creatinine, Ser: 5.02 mg/dL — ABNORMAL HIGH (ref 0.61–1.24)
GFR, Estimated: 12 mL/min — ABNORMAL LOW (ref >60)
Glucose, Bld: 123 mg/dL — ABNORMAL HIGH (ref 70–99)
Potassium: 4.3 mmol/L (ref 3.5–5.1)
Sodium: 135 mmol/L (ref 135–145)

## 2022-08-05 LAB — CBC
HCT: 26.9 % — ABNORMAL LOW (ref 39.0–52.0)
Hemoglobin: 8.3 g/dL — ABNORMAL LOW (ref 13.0–17.0)
MCH: 27 pg (ref 26.0–34.0)
MCHC: 30.9 g/dL (ref 30.0–36.0)
MCV: 87.6 fL (ref 80.0–100.0)
Platelets: 238 10*3/uL (ref 150–400)
RBC: 3.07 MIL/uL — ABNORMAL LOW (ref 4.22–5.81)
RDW: 14.5 % (ref 11.5–15.5)
WBC: 7.6 10*3/uL (ref 4.0–10.5)
nRBC: 0 % (ref 0.0–0.2)

## 2022-08-05 LAB — APTT: aPTT: 37 seconds — ABNORMAL HIGH (ref 24–36)

## 2022-08-05 LAB — PROTIME-INR
INR: 1.2 (ref 0.8–1.2)
Prothrombin Time: 14.6 seconds (ref 11.4–15.2)

## 2022-08-05 LAB — TROPONIN I (HIGH SENSITIVITY)
Troponin I (High Sensitivity): 459 ng/L (ref <18)
Troponin I (High Sensitivity): 383 ng/L (ref <18)
Troponin I (High Sensitivity): 391 ng/L (ref <18)

## 2022-08-05 LAB — CBG MONITORING, ED: Glucose-Capillary: 117 mg/dL — ABNORMAL HIGH (ref 70–99)

## 2022-08-05 MED ORDER — ROSUVASTATIN CALCIUM 10 MG PO TABS
40.0000 mg | ORAL_TABLET | Freq: Every day | ORAL | Status: DC
  Administered 2022-08-06 – 2022-08-10 (×5): 40 mg via ORAL
  Filled 2022-08-05: qty 2
  Filled 2022-08-05 (×2): qty 4
  Filled 2022-08-05: qty 2
  Filled 2022-08-05: qty 4

## 2022-08-05 MED ORDER — ASPIRIN 81 MG PO CHEW
324.0000 mg | CHEWABLE_TABLET | Freq: Once | ORAL | Status: AC
  Administered 2022-08-05: 324 mg via ORAL
  Filled 2022-08-05: qty 4

## 2022-08-05 MED ORDER — DULOXETINE HCL 30 MG PO CPEP
30.0000 mg | ORAL_CAPSULE | Freq: Every day | ORAL | Status: DC
  Administered 2022-08-06 – 2022-08-10 (×5): 30 mg via ORAL
  Filled 2022-08-05 (×5): qty 1

## 2022-08-05 MED ORDER — IBUPROFEN 800 MG PO TABS: 800.0000 mg | ORAL_TABLET | Freq: Three times a day (TID) | ORAL | Status: DC | PRN

## 2022-08-05 MED ORDER — ASPIRIN 81 MG PO TBEC
81.0000 mg | DELAYED_RELEASE_TABLET | Freq: Every day | ORAL | Status: DC
  Administered 2022-08-06 – 2022-08-10 (×5): 81 mg via ORAL
  Filled 2022-08-05 (×5): qty 1

## 2022-08-05 MED ORDER — CARVEDILOL 3.125 MG PO TABS
3.1250 mg | ORAL_TABLET | Freq: Two times a day (BID) | ORAL | Status: DC
  Administered 2022-08-06 – 2022-08-10 (×10): 3.125 mg via ORAL
  Filled 2022-08-05 (×10): qty 1

## 2022-08-05 MED ORDER — HEPARIN (PORCINE) 25000 UT/250ML-% IV SOLN
2050.0000 [IU]/h | INTRAVENOUS | Status: DC
  Administered 2022-08-05: 1400 [IU]/h via INTRAVENOUS
  Administered 2022-08-06: 1700 [IU]/h via INTRAVENOUS
  Filled 2022-08-05 (×3): qty 250

## 2022-08-05 MED ORDER — CYCLOBENZAPRINE HCL 10 MG PO TABS: 10.0000 mg | ORAL_TABLET | Freq: Two times a day (BID) | ORAL | Status: DC | PRN

## 2022-08-05 MED ORDER — SODIUM CHLORIDE 0.9 % IV SOLN: INTRAVENOUS | Status: DC

## 2022-08-05 MED ORDER — CLONIDINE HCL 0.1 MG PO TABS
0.3000 mg | ORAL_TABLET | Freq: Two times a day (BID) | ORAL | Status: DC
  Administered 2022-08-05 – 2022-08-10 (×10): 0.3 mg via ORAL
  Filled 2022-08-05 (×10): qty 3

## 2022-08-05 MED ORDER — LOSARTAN POTASSIUM 50 MG PO TABS
100.0000 mg | ORAL_TABLET | Freq: Every day | ORAL | Status: DC
  Administered 2022-08-05 – 2022-08-09 (×5): 100 mg via ORAL
  Filled 2022-08-05 (×5): qty 2

## 2022-08-05 MED ORDER — ISOSORBIDE MONONITRATE ER 60 MG PO TB24
30.0000 mg | ORAL_TABLET | Freq: Every day | ORAL | Status: DC
  Administered 2022-08-05 – 2022-08-06 (×2): 30 mg via ORAL
  Filled 2022-08-05 (×2): qty 1

## 2022-08-05 MED ORDER — ACETAMINOPHEN 325 MG PO TABS: 650.0000 mg | ORAL_TABLET | ORAL | Status: DC | PRN

## 2022-08-05 MED ORDER — FERROUS GLUCONATE 324 (38 FE) MG PO TABS
324.0000 mg | ORAL_TABLET | Freq: Every day | ORAL | Status: DC
  Administered 2022-08-06 – 2022-08-10 (×5): 324 mg via ORAL
  Filled 2022-08-05 (×5): qty 1

## 2022-08-05 MED ORDER — ONDANSETRON HCL 4 MG/2ML IJ SOLN: 4.0000 mg | Freq: Four times a day (QID) | INTRAMUSCULAR | Status: DC | PRN

## 2022-08-05 MED ORDER — DOXAZOSIN MESYLATE 4 MG PO TABS
4.0000 mg | ORAL_TABLET | Freq: Every day | ORAL | Status: DC
  Administered 2022-08-05 – 2022-08-10 (×6): 4 mg via ORAL
  Filled 2022-08-05 (×6): qty 1

## 2022-08-05 MED ORDER — FENOFIBRATE 54 MG PO TABS
54.0000 mg | ORAL_TABLET | Freq: Every day | ORAL | Status: DC
  Administered 2022-08-06 – 2022-08-10 (×5): 54 mg via ORAL
  Filled 2022-08-05 (×5): qty 1

## 2022-08-05 MED ORDER — AMLODIPINE BESYLATE 5 MG PO TABS
7.5000 mg | ORAL_TABLET | Freq: Every day | ORAL | Status: DC
  Administered 2022-08-05 – 2022-08-09 (×5): 7.5 mg via ORAL
  Filled 2022-08-05 (×5): qty 2

## 2022-08-05 MED ORDER — HYDROCODONE-ACETAMINOPHEN 5-325 MG PO TABS: 1.0000 | ORAL_TABLET | Freq: Four times a day (QID) | ORAL | Status: DC | PRN

## 2022-08-05 MED ORDER — FUROSEMIDE 40 MG PO TABS: 40.0000 mg | ORAL_TABLET | Freq: Every day | ORAL | Status: DC

## 2022-08-05 MED ORDER — INSULIN ASPART 100 UNIT/ML IJ SOLN
0.0000 [IU] | Freq: Three times a day (TID) | INTRAMUSCULAR | Status: DC
  Administered 2022-08-06: 4 [IU] via SUBCUTANEOUS
  Administered 2022-08-08 – 2022-08-09 (×3): 3 [IU] via SUBCUTANEOUS
  Administered 2022-08-10: 4 [IU] via SUBCUTANEOUS
  Filled 2022-08-05 (×4): qty 1

## 2022-08-05 MED ORDER — HEPARIN BOLUS VIA INFUSION
4000.0000 [IU] | Freq: Once | INTRAVENOUS | Status: AC
  Administered 2022-08-05: 4000 [IU] via INTRAVENOUS
  Filled 2022-08-05: qty 4000

## 2022-08-05 MED ORDER — AMLODIPINE BESYLATE 5 MG PO TABS: 5.0000 mg | ORAL_TABLET | Freq: Every day | ORAL | Status: DC

## 2022-08-05 MED ORDER — HYDRALAZINE HCL 50 MG PO TABS
100.0000 mg | ORAL_TABLET | Freq: Three times a day (TID) | ORAL | Status: DC
  Administered 2022-08-05 – 2022-08-10 (×13): 100 mg via ORAL
  Filled 2022-08-05 (×13): qty 2

## 2022-08-05 MED ORDER — INSULIN GLARGINE-YFGN 100 UNIT/ML ~~LOC~~ SOLN
10.0000 [IU] | Freq: Every day | SUBCUTANEOUS | Status: DC
  Administered 2022-08-06 – 2022-08-10 (×5): 10 [IU] via SUBCUTANEOUS
  Filled 2022-08-05 (×7): qty 0.1

## 2022-08-05 MED ORDER — GABAPENTIN 300 MG PO CAPS
600.0000 mg | ORAL_CAPSULE | Freq: Three times a day (TID) | ORAL | Status: DC
  Administered 2022-08-05 – 2022-08-10 (×14): 600 mg via ORAL
  Filled 2022-08-05 (×14): qty 2

## 2022-08-05 MED ORDER — NITROGLYCERIN 0.4 MG SL SUBL: 0.4000 mg | SUBLINGUAL_TABLET | SUBLINGUAL | Status: DC | PRN

## 2022-08-05 MED ORDER — ROPINIROLE HCL 1 MG PO TABS
0.5000 mg | ORAL_TABLET | Freq: Every day | ORAL | Status: DC
  Administered 2022-08-05 – 2022-08-09 (×5): 0.5 mg via ORAL
  Filled 2022-08-05 (×2): qty 1
  Filled 2022-08-05 (×2): qty 2
  Filled 2022-08-05: qty 1

## 2022-08-05 NOTE — ED Provider Triage Note (Signed)
Emergency Medicine Provider Triage Evaluation Note  Lee Bryant. , a 66 y.o. male  was evaluated in triage.  Pt complains of chest pain, shortness of breath.  Had an episode this morning where he had chest pain radiating to his jaw made improved very weak.  Has a known blockage.  History of MI.  Review of Systems  Positive:  Negative:   Physical Exam  BP (!) 168/83 (BP Location: Left Arm)   Pulse 94   Temp 98.6 F (37 C) (Oral)   Resp 18   Ht 6\' 1"  (1.854 m)   Wt 125.2 kg   SpO2 93%   BMI 36.41 kg/m  Gen:   Awake, no distress   Resp:  Normal effort  MSK:   Moves extremities without difficulty  Other:    Medical Decision Making  Medically screening exam initiated at 4:00 PM.  Appropriate orders placed.  [name]. was informed that the remainder of the evaluation will be completed by another provider, this initial triage assessment does not replace that evaluation, and the importance of remaining in the ED until their evaluation is complete.     [name], PA-C 08/05/22 1601

## 2022-08-05 NOTE — Assessment & Plan Note (Signed)
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 Followed by [facility]. NOT on HD. Cr is stable compared to outpatient lab noted in last nephrology note.   Plan Avoid nephrotoxic meds  Minimize any dye load in the event of cardiac cath.

## 2022-08-05 NOTE — Assessment & Plan Note (Addendum)
Seems well controlled with A1c of 5.5.  Patient was using only Amaryl at home. -Continue with basal and SSI

## 2022-08-05 NOTE — ED Provider Notes (Signed)
[facility] Provider Note    Event Date/Time   First MD Initiated Contact with Patient 08/05/22 1723     (approximate)   History   Chest Pain (Pt. To ED via POV for sudden onset CP this AM with SOB, left jaw pain, and headache with fatigue following. Pt. States he has episodes intermittently. Pt's cardiologist states he has a blockage. )   HPI  Lee Bryant. is a 66 y.o. male   Past medical history of diabetes, hypertension, CAD  Had crushing chest pain this morning substernal radiating to the neck while ambulating to his car associated shortness of breath resolved with rest.  No aspirin prior to emergency department.  No current chest pain  "Known blockage" per him, chart review reveals abnormal stress test done in 2021 with inferior abnormalities.    History was obtained via patient and review of external medical notes including stress test in 2021      Physical Exam   Triage Vital Signs: ED Triage Vitals  Enc Vitals Group     BP 08/05/22 1542 (!) 168/83     Pulse Rate 08/05/22 1542 94     Resp 08/05/22 1542 18     Temp 08/05/22 1542 98.6 F (37 C)     Temp Source 08/05/22 1542 Oral     SpO2 08/05/22 1542 93 %     Weight 08/05/22 1543 276 lb (125.2 kg)     Height 08/05/22 1543 6\' 1"  (1.854 m)     Head Circumference --      Peak Flow --      Pain Score 08/05/22 1543 0     Pain Loc --      Pain Edu? --      Excl. in [place]? --     Most recent vital signs: Vitals:   08/05/22 1900 08/05/22 1930  BP: (!) 154/77 (!) 157/78  Pulse: 88 82  Resp: (!) 21 15  Temp:    SpO2: 94% 94%    General: Awake, no distress.  CV:  Good peripheral perfusion.  Resp:  Normal effort.  Abd:  No distention.  Other:     ED Results / Procedures / Treatments   Labs (all labs ordered are listed, but only abnormal results are displayed) Labs Reviewed  BASIC METABOLIC PANEL - Abnormal; Notable for the following components:      Result Value    CO2 21 (*)    Glucose, Bld 123 (*)    BUN 76 (*)    Creatinine, Ser 5.02 (*)    GFR, Estimated 12 (*)    All other components within normal limits  CBC - Abnormal; Notable for the following components:   RBC 3.07 (*)    Hemoglobin 8.3 (*)    HCT 26.9 (*)    All other components within normal limits  APTT - Abnormal; Notable for the following components:   aPTT 37 (*)    All other components within normal limits  TROPONIN I (HIGH SENSITIVITY) - Abnormal; Notable for the following components:   Troponin I (High Sensitivity) 459 (*)    All other components within normal limits  TROPONIN I (HIGH SENSITIVITY) - Abnormal; Notable for the following components:   Troponin I (High Sensitivity) 383 (*)    All other components within normal limits  PROTIME-INR  CBC  HEPARIN LEVEL (UNFRACTIONATED)     I reviewed labs and they are notable for: 459 creatinine 5.02, baseline 4  EKG  ED ECG REPORT I, Lucillie Garfinkel, the attending physician, personally viewed and interpreted this ECG.   Date: 08/05/2022  EKG Time: 1539  Rate: 97  Rhythm: normal sinus rhythm  Axis: normal  Intervals:no stemi    RADIOLOGY I independently reviewed and interpreted cxr w no focality   PROCEDURES:  Critical Care performed: Yes, see critical care procedure note(s)  .Critical Care  Performed by: Lucillie Garfinkel, MD Authorized by: Lucillie Garfinkel, MD   Critical care provider statement:    Critical care time (minutes):  30   Critical care was necessary to treat or prevent imminent or life-threatening deterioration of the following conditions:  Cardiac failure   Critical care was time spent personally by me on the following activities:  Development of treatment plan with patient or surrogate, discussions with consultants, evaluation of patient's response to treatment, examination of patient, ordering and review of laboratory studies, ordering and review of radiographic studies, ordering and performing treatments and  interventions, pulse oximetry, re-evaluation of patient's condition and review of old charts    MEDICATIONS ORDERED IN ED: Medications  heparin ADULT infusion 100 units/mL (25000 units/28mL) (1,400 Units/hr Intravenous New Bag/Given 08/05/22 1859)  aspirin chewable tablet 324 mg (324 mg Oral Given 08/05/22 1849)  heparin bolus via infusion 4,000 Units (4,000 Units Intravenous Bolus from Bag 08/05/22 1859)    Consultants:  I spoke with cardiology Dr Clayborn Bigness regarding care plan for this patient.   IMPRESSION / MDM / ASSESSMENT AND PLAN / ED COURSE  I reviewed the triage vital signs and the nursing notes.                              Differential diagnosis includes, but is not limited to, ACS, angina, respiratory infection, less likely PE or dissection    Cr elevated.  At baseline CKD  Trop markedly elevated, 400+, EKG no STEMI, NSTEMI given ASA; heparinized and admit.    Patient's presentation is most consistent with acute presentation with potential threat to life or bodily function.       FINAL CLINICAL IMPRESSION(S) / ED DIAGNOSES   Final diagnoses:  NSTEMI (non-ST elevated myocardial infarction) ([place])     Rx / DC Orders   ED Discharge Orders     None        Note:  This document was prepared using Dragon voice recognition software and may include unintentional dictation errors.    Lucillie Garfinkel, MD 08/05/22 2000

## 2022-08-05 NOTE — Progress Notes (Addendum)
ANTICOAGULATION CONSULT NOTE - Initial Consult  Pharmacy Consult for Heparin drip  Indication: chest pain/ACS  Allergies  Allergen Reactions   Cephalexin Rash    Patient Measurements: Height: 6\' 1"  (185.4 cm) Weight: 125.2 kg (276 lb) IBW/kg (Calculated) : 79.9 Heparin Dosing Weight: 107.5 kg  Vital Signs: Temp: 98.6 F (37 C) (10/18 1542) Temp Source: Oral (10/18 1542) BP: 175/75 (10/18 1720) Pulse Rate: 92 (10/18 1720)  Labs: Recent Labs    08/05/22 1545 08/05/22 1745  HGB 8.3*  --   HCT 26.9*  --   PLT 238  --   CREATININE 5.02*  --   TROPONINIHS 459* 383*    Estimated Creatinine Clearance: 19.8 mL/min (A) (by C-G formula based on SCr of 5.02 mg/dL (H)).   Medical History: Past Medical History:  Diagnosis Date   Cellulitis    Diabetes mellitus ([place])    type II   Hypertension     Medications:  (Not in a hospital admission)  Scheduled:   aspirin  324 mg Oral Once   heparin  4,000 Units Intravenous Once   Infusions:   heparin      Assessment: 66 yo male to start heparin drip for CP/ACS. Patient with sudden onset CP this AM with SOB, left jaw pain, and headache  No anticoagulants PTA per Med Rec Hgb 8.3  plt 238  aptt 37  INR  1.2    Goal of Therapy:  Heparin level 0.3-0.7 units/ml Monitor platelets by anticoagulation protocol: Yes   Plan:  Give 4000 units bolus x 1 Start heparin infusion at 1400 units/hr Check anti-Xa level in 6 hours and daily while on heparin Continue to monitor H&H and platelets  Etta Gassett A 08/05/2022,6:26 PM

## 2022-08-05 NOTE — Subjective & Objective (Signed)
Lee Bryant is followed for CKD V by [facility], HTN, HLD, obesity, DM2, renal related anemia. ON the morning of admission when walking to his care he had an episode of crushing chest pain with radiation of pain to the neck and acute dyspnea. He rested and his symptoms abated. He proceeded to see his PCP for acute chest pain. He was referred to [facility] for further evaluation with a high risk profile for ACS.

## 2022-08-05 NOTE — H&P (Signed)
History and Physical    Lee Bryant. QJF:[id number] DOB: 08/25/1956 DOA: 08/05/2022  DOS: the patient was seen and examined on 08/05/2022  PCP: Tracie Harrier, MD   Patient coming from: Home  I have personally briefly reviewed patient's old medical records in [place]  Lee Bryant is followed for CKD V by [facility], HTN, HLD, obesity, DM2, renal related anemia. ON the morning of admission when walking to his care he had an episode of crushing chest pain with radiation of pain to the neck and acute dyspnea. He rested and his symptoms abated. He proceeded to see his PCP for acute chest pain. He was referred to [facility] for further evaluation with a high risk profile for ACS.   ED Course: afebrile. 155/66  HR 81  RR 16 BMI 36.4. Patient was in no distress at admission. Lab: glucose 123  BUN 76, Cr 5.02, Hgb 8.3 (chronic). CXR- NAD, EKG NSR, w/o ST elevation. Troponin #1 459, #2 383. EDP -called Dr. Clayborn Bigness for cardiology who recommended medicine service to admit, start heparin and cardiology will see for further evaluation. [facility] called.  Review of Systems:  Review of Systems  Constitutional: Negative.   HENT: Negative.    Eyes: Negative.   Respiratory:  Positive for shortness of breath.   Cardiovascular:  Positive for chest pain and leg swelling.  Gastrointestinal: Negative.   Genitourinary: Negative.   Musculoskeletal: Negative.   Skin: Negative.   Neurological: Negative.   Endo/Heme/Allergies: Negative.   Psychiatric/Behavioral: Negative.      Past Medical History:  Diagnosis Date   CAD (coronary artery disease)    had abnormal nuclear stress test 2021 or 2022   Cellulitis    CKD (chronic kidney disease), stage V (HCC)    Decubitus ulcer of sacral region, stage 4 ([place])    resolved - required multiple surgical interventions   Diabetes mellitus ([place])    type II   HLD (hyperlipidemia)    Hypertension    Obesity (BMI 30-39.9)     Past Surgical  History:  Procedure Laterality Date   ducubitus ulcer debridement N/A    Soc Hx - marriage #1 - 50 years, 3 children; marriage #2 (common law) 10 years - 3 children; marriage #3 33 years and going strong - 2 step-children. 19 grandchildren, 5 great-grands. 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 Moved from [place] to [place]. Likes Geneticist, molecular.    reports that he has quit smoking. His smoking use included cigarettes. He has never used smokeless tobacco. He reports current alcohol use. He reports current drug use. Drug: Marijuana.  Allergies  Allergen Reactions   Cephalexin Rash    Family History  Problem Relation Age of Onset   Cancer Mother    Hypertension Mother    Heart attack Father    Cancer Father    Stroke Sister    Diabetes Brother    Kidney failure Brother     Prior to Admission medications   Medication Sig Start Date End Date Taking? Authorizing Provider  amoxicillin-clavulanate (AUGMENTIN) 875-125 MG tablet Take 1 tablet by mouth every 12 (twelve) hours. 03/14/17   Fritzi Mandes, MD  aspirin EC 81 MG tablet Take 81 mg by mouth daily.    [provider]  collagenase (SANTYL) ointment Apply topically. 02/12/17   [provider]  cyclobenzaprine (FLEXERIL) 10 MG tablet  02/12/17   [provider]  fenofibrate (TRICOR) 48 MG tablet Take 48 mg by mouth daily.  09/07/16   [provider]  furosemide (LASIX) 40 MG tablet Take 1 tablet (40 mg total) by mouth daily. 03/15/17   Fritzi Mandes, MD  gabapentin (NEURONTIN) 300 MG capsule  01/11/17   [provider]  gabapentin (NEURONTIN) 300 MG capsule Take 2 capsules (600 mg total) by mouth 3 (three) times daily. 03/14/17 03/14/18  Fritzi Mandes, MD  glimepiride (AMARYL) 4 MG tablet Take 4 mg by mouth 2 (two) times daily. 10/02/16 10/02/17  [provider]  hydrALAZINE (APRESOLINE) 25 MG tablet Take 25 mg by mouth daily. 02/14/16 02/13/17  [provider]   HYDROcodone-acetaminophen (NORCO/VICODIN) 5-325 MG tablet Take 1 tablet by mouth daily as needed. 03/14/17   Fritzi Mandes, MD  ibuprofen (ADVIL,MOTRIN) 800 MG tablet Take 800 mg by mouth daily as needed. 09/24/16   [provider]  lidocaine (XYLOCAINE) 2 % jelly  01/14/17   [provider]  metFORMIN (GLUCOPHAGE) 1000 MG tablet Take 1,000 mg by mouth 2 (two) times daily. 08/17/16 08/17/17  [provider]  valsartan-hydrochlorothiazide (DIOVAN-HCT) 320-12.5 MG tablet TAKE ONE (1) TABLET EACH DAY 07/02/16   [provider]    Physical Exam: Vitals:   08/05/22 1900 08/05/22 1930 08/05/22 2000 08/05/22 2030  BP: (!) 154/77 (!) 157/78 (!) 151/78 137/87  Pulse: 88 82 85 79  Resp: (!) 21 15 (!) 21 18  Temp:      TempSrc:      SpO2: 94% 94% 96% 93%  Weight:      Height:        Physical Exam Constitutional:      General: He is not in acute distress.    Appearance: He is obese. He is not toxic-appearing or diaphoretic.  HENT:     Head: Normocephalic and atraumatic.  Eyes:     Extraocular Movements: Extraocular movements intact.     Pupils: Pupils are equal, round, and reactive to light.  Neck:     Thyroid: No thyromegaly.     Vascular: No hepatojugular reflux or JVD.  Cardiovascular:     Rate and Rhythm: Normal rate and regular rhythm. No extrasystoles are present.    Pulses:          Carotid pulses are 2+ on the right side and 2+ on the left side.      Radial pulses are 2+ on the right side and 2+ on the left side.       Dorsalis pedis pulses are 1+ on the right side and 1+ on the left side.     Heart sounds: Normal heart sounds. No murmur heard. Pulmonary:     Effort: Pulmonary effort is normal. No tachypnea or respiratory distress.     Breath sounds: Normal breath sounds.  Chest:     Chest wall: No mass or deformity.  Abdominal:     General: Bowel sounds are normal.     Palpations: Abdomen is soft.     Tenderness: There is no guarding.      Comments: Obese, girth limits palpation  Musculoskeletal:        General: Normal range of motion.     Cervical back: Normal range of motion and neck supple.     Comments: Wearing compression hose - no edema noted  Skin:    General: Skin is warm.  Neurological:     General: No focal deficit present.     Mental Status: He is alert and oriented to person, place, and time.  Psychiatric:  Mood and Affect: Mood normal.        Behavior: Behavior normal.      Labs on Admission: I have personally reviewed following labs and imaging studies  CBC: Recent Labs  Lab 08/05/22 1545  WBC 7.6  HGB 8.3*  HCT 26.9*  MCV 87.6  PLT 388   Basic Metabolic Panel: Recent Labs  Lab 08/05/22 1545  NA 135  K 4.3  CL 106  CO2 21*  GLUCOSE 123*  BUN 76*  CREATININE 5.02*  CALCIUM 9.4   GFR: Estimated Creatinine Clearance: 19.8 mL/min (A) (by C-G formula based on SCr of 5.02 mg/dL (H)). Liver Function Tests: No results for input(s): "AST", "ALT", "ALKPHOS", "BILITOT", "PROT", "ALBUMIN" in the last 168 hours. No results for input(s): "LIPASE", "AMYLASE" in the last 168 hours. No results for input(s): "AMMONIA" in the last 168 hours. Coagulation Profile: Recent Labs  Lab 08/05/22 1743  INR 1.2   Cardiac Enzymes: No results for input(s): "CKTOTAL", "CKMB", "CKMBINDEX", "TROPONINI" in the last 168 hours. BNP (last 3 results) No results for input(s): "PROBNP" in the last 8760 hours. HbA1C: No results for input(s): "HGBA1C" in the last 72 hours. CBG: No results for input(s): "GLUCAP" in the last 168 hours. Lipid Profile: No results for input(s): "CHOL", "HDL", "LDLCALC", "TRIG", "CHOLHDL", "LDLDIRECT" in the last 72 hours. Thyroid Function Tests: No results for input(s): "TSH", "T4TOTAL", "FREET4", "T3FREE", "THYROIDAB" in the last 72 hours. Anemia Panel: No results for input(s): "VITAMINB12", "FOLATE", "FERRITIN", "TIBC", "IRON", "RETICCTPCT" in the last 72 hours. Urine  analysis: No results found for: "COLORURINE", "APPEARANCEUR", "LABSPEC", "PHURINE", "GLUCOSEU", "HGBUR", "BILIRUBINUR", "KETONESUR", "PROTEINUR", "UROBILINOGEN", "NITRITE", "LEUKOCYTESUR"  Radiological Exams on Admission: I have personally reviewed images DG Chest 2 View  Result Date: 08/05/2022 CLINICAL DATA:  Chest pain EXAM: CHEST - 2 VIEW COMPARISON:  None Available. FINDINGS: Mild cardiac enlargement with normal pulmonary vascularity. Lungs clear without infiltrate or effusion Advanced degenerative change in both shoulders. IMPRESSION: No active cardiopulmonary disease. Electronically Signed   By: Franchot Gallo M.D.   On: 08/05/2022 16:23    EKG: I have personally reviewed EKG: NSR, no ST elevation  Assessment/Plan Principal Problem:   NSTEMI (non-ST elevated myocardial infarction) (HCC) Active Problems:   Obesity (BMI 30-39.9)   DM2 (diabetes mellitus, type 2) (HCC)   CKD (chronic kidney disease), stage V (HCC)   HTN (hypertension)    Assessment and Plan: * NSTEMI (non-ST elevated myocardial infarction) ([place]) Patient with multiple risk factors: DM, HTN, Obesity, CKD V with chronic anemia. Patient reports he had a nuclear stress test 1-2 years ago revealing CAD which has been managed medically. Over the last month he has had progressive DOE and reports 2-3 episodes of exertional chest pain that resolved with rest.  On the morning of admission he had another episode of crushing chest pain with radiation to the jaw and neck with acute dyspnea that was more severe than previous episodes consistent with unstable angina. He went to his PCP who referred him to [facility] for evaluation. EKG w/o acute STEMI; troponin #1 459, #2 383.   Plan Continue heparin infusion for ACS  Cardiology consult for further evaluation.  CKD (chronic kidney disease), stage V (HCC) Progressive disease 2/2 diabetes. Followed by [facility]. NOT on HD. Cr is stable compared to outpatient lab noted in  last nephrology note.   Plan Avoid nephrotoxic meds  Minimize any dye load in the event of cardiac cath.  DM2 (diabetes mellitus, type 2) ([place]) On oral  medications, no insulin. Has significant diabetic related disease including CKD. History of diabetic leg/foot ulcers and had a stage V decubitus ulcer that required multiple surgeries.   Plan A1C  Basal insulin therapy  SS coverage  Obesity (BMI 30-39.9) Long standing problem.  Plan Diabetic dietician for counseling  Can consider ozempic as an outpatient.  HTN (hypertension) Long standing problem. Currently  on ARB, CCB, peripheral dilator.  Plan Continue outpatient meds  Add beta-blocker  Cardiology consult pending and may adjust meds       DVT prophylaxis: IV heparin gtts Code Status: Full Code Family Communication: wife present during interview and exam. Very informed about his condition and meds  Disposition Plan: TBD  Consults called: Cardiology - Dr. Clayborn Bigness aware and will see patient  Admission status: Inpatient, Telemetry bed   Adella Hare, MD [facility] 08/05/2022, 9:35 PM

## 2022-08-05 NOTE — Assessment & Plan Note (Signed)
Long standing problem.  Plan Diabetic dietician for counseling  Can consider ozempic as an outpatient.

## 2022-08-05 NOTE — ED Triage Notes (Signed)
Pt. To ED via POV for sudden onset CP this AM with SOB, left jaw pain, and headache with fatigue following. Pt. States he has episodes intermittently. Pt's cardiologist states he has a blockage.

## 2022-08-05 NOTE — Assessment & Plan Note (Addendum)
Patient with multiple risk factors: DM, HTN, Obesity, CKD V with chronic anemia. Patient reports he had a nuclear stress test 1-2 years ago revealing CAD which has been managed medically. Over the last month he has had progressive DOE and reports 2-3 episodes of exertional chest pain that resolved with rest.  On the morning of admission he had another episode of crushing chest pain with radiation to the jaw and neck with acute dyspnea that was more severe than previous episodes consistent with unstable angina. He went to his PCP who referred him to [facility] for evaluation. EKG w/o acute STEMI; troponin #1 459, #2 383.   Plan Continue heparin infusion for ACS  Cardiology consult for further evaluation.

## 2022-08-05 NOTE — Assessment & Plan Note (Addendum)
Long standing problem. Currently  on ARB, CCB, peripheral dilator.  Plan Continue outpatient meds  Add beta-blocker  Cardiology consult pending and may adjust meds

## 2022-08-06 ENCOUNTER — Other Ambulatory Visit: Payer: Self-pay

## 2022-08-06 ENCOUNTER — Inpatient Hospital Stay
Admit: 2022-08-06 | Discharge: 2022-08-06 | Disposition: A | Discharge status: Home / Self Care | Payer: PPO | Attending: Internal Medicine | Admitting: Internal Medicine

## 2022-08-06 LAB — ECHOCARDIOGRAM COMPLETE
AR max vel: 2.53 cm2
AV Area VTI: 2.69 cm2
AV Area mean vel: 2.49 cm2
AV Mean grad: 7 mmHg
AV Peak grad: 13.5 mmHg
Ao pk vel: 1.84 m/s
Area-P 1/2: 4.49 cm2
Calc EF: 60.8 %
Height: 73 in
S' Lateral: 4.2 cm
Single Plane A2C EF: 61.7 %
Single Plane A4C EF: 63.3 %
Weight: 4416 oz

## 2022-08-06 LAB — CBC
HCT: 23.2 % — ABNORMAL LOW (ref 39.0–52.0)
Hemoglobin: 7.1 g/dL — ABNORMAL LOW (ref 13.0–17.0)
MCH: 26.6 pg (ref 26.0–34.0)
MCHC: 30.6 g/dL (ref 30.0–36.0)
MCV: 86.9 fL (ref 80.0–100.0)
Platelets: 216 10*3/uL (ref 150–400)
RBC: 2.67 MIL/uL — ABNORMAL LOW (ref 4.22–5.81)
RDW: 14.6 % (ref 11.5–15.5)
WBC: 7.1 10*3/uL (ref 4.0–10.5)
nRBC: 0 % (ref 0.0–0.2)

## 2022-08-06 LAB — CBG MONITORING, ED
Glucose-Capillary: 118 mg/dL — ABNORMAL HIGH (ref 70–99)
Glucose-Capillary: 106 mg/dL — ABNORMAL HIGH (ref 70–99)
Glucose-Capillary: 155 mg/dL — ABNORMAL HIGH (ref 70–99)
Glucose-Capillary: 90 mg/dL (ref 70–99)

## 2022-08-06 LAB — LIPID PANEL
Cholesterol: 118 mg/dL (ref 0–200)
HDL: 25 mg/dL — ABNORMAL LOW (ref >40)
LDL Cholesterol: 59 mg/dL (ref 0–99)
Total CHOL/HDL Ratio: 4.7 RATIO
Triglycerides: 171 mg/dL — ABNORMAL HIGH (ref <150)
VLDL: 34 mg/dL (ref 0–40)

## 2022-08-06 LAB — BASIC METABOLIC PANEL
Anion gap: 8 (ref 5–15)
BUN: 69 mg/dL — ABNORMAL HIGH (ref 8–23)
CO2: 19 mmol/L — ABNORMAL LOW (ref 22–32)
Calcium: 8.9 mg/dL (ref 8.9–10.3)
Chloride: 110 mmol/L (ref 98–111)
Creatinine, Ser: 4.95 mg/dL — ABNORMAL HIGH (ref 0.61–1.24)
GFR, Estimated: 12 mL/min — ABNORMAL LOW (ref >60)
Glucose, Bld: 165 mg/dL — ABNORMAL HIGH (ref 70–99)
Potassium: 4.1 mmol/L (ref 3.5–5.1)
Sodium: 137 mmol/L (ref 135–145)

## 2022-08-06 LAB — HEMOGLOBIN A1C
Hgb A1c MFr Bld: 5.5 % (ref 4.8–5.6)
Mean Plasma Glucose: 111.15 mg/dL

## 2022-08-06 LAB — HEPARIN LEVEL (UNFRACTIONATED)
Heparin Unfractionated: <0.1 IU/mL — ABNORMAL LOW (ref 0.30–0.70)
Heparin Unfractionated: 0.23 IU/mL — ABNORMAL LOW (ref 0.30–0.70)

## 2022-08-06 LAB — TROPONIN I (HIGH SENSITIVITY): Troponin I (High Sensitivity): 337 ng/L (ref <18)

## 2022-08-06 MED ORDER — HEPARIN BOLUS VIA INFUSION
1600.0000 [IU] | Freq: Once | INTRAVENOUS | Status: AC
  Administered 2022-08-06: 1600 [IU] via INTRAVENOUS
  Filled 2022-08-06: qty 1600

## 2022-08-06 MED ORDER — ISOSORBIDE MONONITRATE ER 60 MG PO TB24
30.0000 mg | ORAL_TABLET | Freq: Once | ORAL | Status: AC
  Administered 2022-08-06: 30 mg via ORAL
  Filled 2022-08-06: qty 1

## 2022-08-06 MED ORDER — HEPARIN BOLUS VIA INFUSION
3200.0000 [IU] | Freq: Once | INTRAVENOUS | Status: AC
  Administered 2022-08-06: 3200 [IU] via INTRAVENOUS
  Filled 2022-08-06: qty 3200

## 2022-08-06 MED ORDER — PERFLUTREN LIPID MICROSPHERE
1.0000 mL | INTRAVENOUS | Status: AC | PRN
  Administered 2022-08-06: 3 mL via INTRAVENOUS

## 2022-08-06 MED ORDER — ISOSORBIDE MONONITRATE ER 60 MG PO TB24
60.0000 mg | ORAL_TABLET | Freq: Every day | ORAL | Status: DC
  Administered 2022-08-07 – 2022-08-09 (×3): 60 mg via ORAL
  Filled 2022-08-06 (×3): qty 1

## 2022-08-06 MED ORDER — SODIUM CHLORIDE 0.9% FLUSH
3.0000 mL | Freq: Two times a day (BID) | INTRAVENOUS | Status: DC
  Administered 2022-08-08 – 2022-08-10 (×4): 3 mL via INTRAVENOUS

## 2022-08-06 NOTE — Progress Notes (Signed)
ANTICOAGULATION CONSULT NOTE  Pharmacy Consult for Heparin drip  Indication: chest pain/ACS  Allergies  Allergen Reactions   Cephalexin Rash    Patient Measurements: Height: 6\' 1"  (185.4 cm) Weight: 125.2 kg (276 lb) IBW/kg (Calculated) : 79.9 Heparin Dosing Weight: 107.5 kg  Vital Signs: Temp: 98.3 F (36.8 C) (10/19 0240) Temp Source: Oral (10/19 0240) BP: 142/71 (10/19 0515) Pulse Rate: 84 (10/19 0515)  Labs: Recent Labs    08/05/22 1545 08/05/22 1743 08/05/22 1745 08/05/22 2209 08/06/22 0009 08/06/22 0303  HGB 8.3*  --   --   --   --  7.1*  HCT 26.9*  --   --   --   --  23.2*  PLT 238  --   --   --   --  216  APTT  --  37*  --   --   --   --   LABPROT  --  14.6  --   --   --   --   INR  --  1.2  --   --   --   --   HEPARINUNFRC  --   --   --   --   --  <0.10*  CREATININE 5.02*  --   --   --   --   --   TROPONINIHS 459*  --  383* 391* 337*  --      Estimated Creatinine Clearance: 19.8 mL/min (A) (by C-G formula based on SCr of 5.02 mg/dL (H)).   Medical History: Past Medical History:  Diagnosis Date   CAD (coronary artery disease)    had abnormal nuclear stress test 2021 or 2022   Cellulitis    CKD (chronic kidney disease), stage V (HCC)    Decubitus ulcer of sacral region, stage 4 ([place])    resolved - required multiple surgical interventions   Diabetes mellitus ([place])    type II   HLD (hyperlipidemia)    Hypertension    Obesity (BMI 30-39.9)     Medications:  (Not in a hospital admission) Scheduled:   amLODipine  7.5 mg Oral Daily   aspirin EC  81 mg Oral Daily   carvedilol  3.125 mg Oral BID WC   cloNIDine  0.3 mg Oral BID   doxazosin  4 mg Oral QHS   DULoxetine  30 mg Oral Daily   fenofibrate  54 mg Oral Daily   ferrous gluconate  324 mg Oral Q breakfast   gabapentin  600 mg Oral TID   hydrALAZINE  100 mg Oral TID   insulin aspart  0-20 Units Subcutaneous TID WC   insulin glargine-yfgn  10 Units Subcutaneous QHS   isosorbide  mononitrate  30 mg Oral Daily   losartan  100 mg Oral Daily   rOPINIRole  0.5 mg Oral QHS   rosuvastatin  40 mg Oral Daily   Infusions:   sodium chloride 10 mL/hr at 08/05/22 2213   heparin 1,400 Units/hr (08/06/22 0300)    Assessment: 66 yo male to start heparin drip for CP/ACS. Patient with sudden onset CP this AM with SOB, left jaw pain, and headache  No anticoagulants PTA per Med Rec Hgb 8.3  plt 238  aptt 37  INR  1.2    Goal of Therapy:  Heparin level 0.3-0.7 units/ml Monitor platelets by anticoagulation protocol: Yes   10/19 0303 HL < 0.1  Plan:  Bolus 3200 units x 1 Increase heparin infusion to 1700 units/hr Will recheck HL in 8  hr after rate change CBC daily while on heparin  Renda Rolls, PharmD, [facility] 08/06/2022 5:51 AM

## 2022-08-06 NOTE — Inpatient Diabetes Management (Signed)
Inpatient Diabetes Program Recommendations  AACE/ADA: New Consensus Statement on Inpatient Glycemic Control   Target Ranges:  Prepandial:   less than 140 mg/dL      Peak postprandial:   less than 180 mg/dL (1-2 hours)      Critically ill patients:  140 - 180 mg/dL    Latest Reference Range & Units 08/05/22 22:06 08/06/22 08:06  Glucose-Capillary 70 - 99 mg/dL 117 (H) 118 (H)    Latest Reference Range & Units 08/05/22 15:47  Hemoglobin A1C 4.8 - 5.6 % 5.5   Review of Glycemic Control  Diabetes history: DM2 Outpatient Diabetes medications: Amaryl 4 mg BID, Metformin 1000 mg BID (no longer taking; provider stopped) Current orders for Inpatient glycemic control: Semglee 10 units QHS, Novolog 0-20 units TID with meals  NOTE: Spoke with patient at bedside regarding DM. Patient reports that he is taking only Amaryl 4 mg BID as an outpatient for DM control. Patient reports that he use to take Metformin but it was stopped in past due to improved DM control. Patient reports that his glucose usually trends well and he reports he rarely has any issues with hypoglycemia. Patient reports that last A1C was 6%; discussed current A1C 5.5% on 08/05/22 indicating an average glucose of 111 mg/dl.   Patient reports that he has not had any hypoglycemia since early summer and that he starts getting symptoms if glucose gets less than 68-65 mg/dl. Patient reports that he follows carb modified diet and has made dietary changes over the past few months. Patient notes that he is suppose to be starting peritoneal dialysis in the near future. Discussed current insulin orders and informed patient that he received Semglee last night and has Novolog correction insulin ordered. Patient states that he has not been given any food today. Encouraged patient to let nursing staff know if he starts to get any symptoms of hypoglycemia. Patient verbalized understanding of information and has no questions at this time.  Thanks, Barnie Alderman, RN, MSN, [facility] Diabetes Coordinator Inpatient Diabetes Program [phone] (Team Pager from 8am to [place])

## 2022-08-06 NOTE — Inpatient Diabetes Management (Signed)
Inpatient Diabetes Program Recommendations  AACE/ADA: New Consensus Statement on Inpatient Glycemic Control (2015)  Target Ranges:  Prepandial:   less than 140 mg/dL      Peak postprandial:   less than 180 mg/dL (1-2 hours)      Critically ill patients:  140 - 180 mg/dL   Lab Results  Component Value Date   GLUCAP 118 (H) 08/06/2022   HGBA1C 5.5 08/05/2022    Review of Glycemic Control  Diabetes history: DM 2 Outpatient Diabetes medications: Amaryl 4 mg bid, metformin 1000 mg bid Current orders for Inpatient glycemic control:  Novolog 0-20 units tid Semglee 10 units qhs  A1c 5.5% on 10/18 Hgb reading at the time 8.3 making A1C inaccurate  Consult: diet management  Inpatient Diabetes Program Recommendations:    Glucose trends at goal in the low 100 range.  A1c low however Hgb levels are low.   Pt in ED currently will follow glucose trends. If trends become elevated may need additional medication therapy outpatient. Will attempt to speak with pt once he has a room since there are no phones in the ED and the pt did not pick up his cell phone and voicemail box is full.  Thanks,  Tama Headings RN, MSN, BC-ADM Inpatient Diabetes Coordinator Team Pager [phone] (8a-5p)

## 2022-08-06 NOTE — Consult Note (Signed)
[place] NOTE       Patient ID: Lee Bryant. MRN: [id number] DOB/AGE: 1956-02-13 66 y.o.  Admit date: 08/05/2022 Referring Physician Dr. Lorella Nimrod Primary Physician Dr. Ginette Pitman Primary Cardiologist Dr. Ubaldo Glassing, seen for 2 visits, most recently in 2021  reason for Consultation NSTEMI  HPI: Lee Bryant. Lee Bryant. is a 66 year old male with a PMH of abnormal Lexiscan Myoview 05/2020 with moderate area of inferior hypoperfusion and EF 40% (medically managed), CKD 5 (Cr 4.8-5.5, GFR 11 Dr. Murlean Iba, referred for [facility] transplant center for evaluation 06/2022) secondary to DM 2 (A1c 6.1% 02/2022) anemia of chronic disease, essential hypertension who presented to [facility] ED 08/05/2022 with worsening dyspnea on exertion and increased frequency and severity of his chronic angina. Cardiology is consulted for further assistance.  The patient was seen by Dr. Ubaldo Glassing for 2 visits in 2021 for chest pain, he had a Lexiscan Myoview that showed borderline inferior hypoperfusion and an EF of 40%, ultimately medically managed and risk factor modification was recommended in the setting of his at the time CKD 4.  He has been followed closely by his outpatient nephrologist who is in the process of coordinating home dialysis for him and referred him to the [facility] transplant center, for which she has an appointment on November 16.  The patient says he has had worsening dyspnea on exertion over the past couple years in addition to intermittent episodes of chest tightness with exertion.  Previously they only occurred about once a month, however over the past several months he seems to have chest tightness once weekly.  The symptoms have limited his ability to work in his garden, take care of his chickens and other animals.  He gets short of breath walking several 100 feet.  Yesterday morning he was taking his wife to work and when he was walking inside his home from the car he says his chest was  very tight and he had a severe pain he rated a 7 out of 10 in the center of his chest with associated shortness of breath, numbness in both of his hands, intense jaw pain, with a headache afterwards.  He sat down to rest and after about 30 minutes the symptoms went away without intervention.  He called his primary care doctor (Dr. Ginette Pitman) who told him to come to the emergency department immediately for further evaluation.  In the ED he was given 325 mg of aspirin and started on a heparin drip.  He was started on isosorbide 30 mg as well.  He denies further episodes of chest pain since he has been in the ED, and at my time of evaluation he was sleeping comfortably, but easily woken up and remains chest pain-free.  He currently denies any shortness of breath at rest, but thinks if he had to walk to the [place] parking area just a few 100 feet outside it would make him short of breath.  He denies any dizziness, palpitations, abdominal pain, peripheral edema.  He is still making urine, despite his poor renal function.  He tried cigarettes when he was 9 or 10 did not like them and has not smoked since then.  He smokes marijuana daily, but has not done so over the past week due to making his breathing worse.  He has not drink alcohol in many years since he started managing his diabetes, but previously "drank a fair amount of beer."  His father had a heart attack and needed bypass surgery  in his 17s.  He is unable to exercise or be as active as he wants to be due to his chest discomfort.  Labs are notable for a potassium of 4.1, BUN/creatinine 69/4.95 and GFR of 12.  High-sensitivity troponin elevated but with a flat trend at [phone].  Cholesterol panel with TC 118, HDL 25, LDL 59, triglycerides 171, VLDL 34.  H&H with drop overnight from 8.3/26.9-7.1/23.2.  Current platelets 216.  No leukocytosis with WBC 7.1.   Review of systems complete and found to be negative unless listed above     Past Medical  History:  Diagnosis Date   CAD (coronary artery disease)    had abnormal nuclear stress test 2021 or 2022   Cellulitis    CKD (chronic kidney disease), stage V (HCC)    Decubitus ulcer of sacral region, stage 4 ([place])    resolved - required multiple surgical interventions   Diabetes mellitus ([place])    type II   HLD (hyperlipidemia)    Hypertension    Obesity (BMI 30-39.9)     Past Surgical History:  Procedure Laterality Date   ducubitus ulcer debridement N/A     (Not in a hospital admission)  Social History   Socioeconomic History   Marital status: Married    Spouse name: Not on file   Number of children: Not on file   Years of education: Not on file   Highest education level: Not on file  Occupational History   Not on file  Tobacco Use   Smoking status: Former    Types: Cigarettes   Smokeless tobacco: Never   Tobacco comments:    when he was 66yrs old.  Vaping Use   Vaping Use: Never used  Substance and Sexual Activity   Alcohol use: Yes    Comment: occassionally.   Drug use: Yes    Types: Marijuana   Sexual activity: Yes    Partners: Female    Birth control/protection: None  Other Topics Concern   Not on file  Social History Narrative   Not on file   Social Determinants of Health   Financial Resource Strain: Not on file  Food Insecurity: No Food Insecurity (08/06/2022)   Hunger Vital Sign    Worried About Running Out of Food in the Last Year: Never true    Ran Out of Food in the Last Year: Never true  Transportation Needs: No Transportation Needs (08/06/2022)   PRAPARE - Hydrologist (Medical): No    Lack of Transportation (Non-Medical): No  Physical Activity: Not on file  Stress: Not on file  Social Connections: Not on file  Intimate Partner Violence: Not At Risk (08/06/2022)   Humiliation, Afraid, Rape, and Kick questionnaire    Fear of Current or Ex-Partner: No    Emotionally Abused: No    Physically Abused: No     Sexually Abused: No    Family History  Problem Relation Age of Onset   Cancer Mother    Hypertension Mother    Heart attack Father    Cancer Father    Stroke Sister    Diabetes Brother    Kidney failure Brother      Vitals:   08/06/22 0900 08/06/22 1000 08/06/22 1100 08/06/22 1137  BP: (!) 157/77 (!) 169/85 (!) 146/91 (!) 146/91  Pulse: 85  72   Resp:   19   Temp:      TempSrc:      SpO2: 92%  96%  Weight:      Height:        PHYSICAL EXAM General: Pleasant conversational Caucasian male, well nourished, in no acute distress.  Sitting upright with legs off bed. HEENT:  Normocephalic and atraumatic. Neck:  No JVD.  Lungs: Normal respiratory effort on room air. Clear bilaterally to auscultation. No wheezes, crackles, rhonchi.  Heart: HRRR . Normal S1 and S2 without gallops or murmurs.  Abdomen: Non-distended appearing with excess adiposity.  Msk: Normal strength and tone for age. Extremities: Warm and well perfused. No clubbing, cyanosis.  No peripheral edema.  Neuro: Alert and oriented X 3. Psych:  Answers questions appropriately.   Labs: Basic Metabolic Panel: Recent Labs    08/05/22 1545 08/06/22 0303  NA 135 137  K 4.3 4.1  CL 106 110  CO2 21* 19*  GLUCOSE 123* 165*  BUN 76* 69*  CREATININE 5.02* 4.95*  CALCIUM 9.4 8.9   Liver Function Tests: No results for input(s): "AST", "ALT", "ALKPHOS", "BILITOT", "PROT", "ALBUMIN" in the last 72 hours. No results for input(s): "LIPASE", "AMYLASE" in the last 72 hours. CBC: Recent Labs    08/05/22 1545 08/06/22 0303  WBC 7.6 7.1  HGB 8.3* 7.1*  HCT 26.9* 23.2*  MCV 87.6 86.9  PLT 238 216   Cardiac Enzymes: Recent Labs    08/05/22 1745 08/05/22 2209 08/06/22 0009  TROPONINIHS 383* 391* 337*   BNP: No results for input(s): "BNP" in the last 72 hours. D-Dimer: No results for input(s): "DDIMER" in the last 72 hours. Hemoglobin A1C: Recent Labs    08/05/22 1547  HGBA1C 5.5   Fasting Lipid  Panel: Recent Labs    08/06/22 0303  CHOL 118  HDL 25*  LDLCALC 59  TRIG 171*  CHOLHDL 4.7   Thyroid Function Tests: No results for input(s): "TSH", "T4TOTAL", "T3FREE", "THYROIDAB" in the last 72 hours.  Invalid input(s): "FREET3" Anemia Panel: No results for input(s): "VITAMINB12", "FOLATE", "FERRITIN", "TIBC", "IRON", "RETICCTPCT" in the last 72 hours.   Radiology: DG Chest 2 View  Result Date: 08/05/2022 CLINICAL DATA:  Chest pain EXAM: CHEST - 2 VIEW COMPARISON:  None Available. FINDINGS: Mild cardiac enlargement with normal pulmonary vascularity. Lungs clear without infiltrate or effusion Advanced degenerative change in both shoulders. IMPRESSION: No active cardiopulmonary disease. Electronically Signed   By: Franchot Gallo M.D.   On: 08/05/2022 16:23    06/03/2020 Lexiscan Myoview Impression   Stress test showed reduced LV function at 40%.  There is moderate area of  moderate hypoperfusion in the inferior wall with partial redistribution.    Will need further follow-up to discuss further treatment options and  studies.  Abnormal stress test. Narrative  CARDIOLOGY DEPARTMENT  [facility]  [facility]  [address], [place], [place]  [address]  [phone]   Procedure: Pharmacologic Myocardial Perfusion Imaging    ONE day procedure   Indication: Chest pain at rest  Plan: NM myocardial perfusion SPECT multiple (stress        and rest), ECG stress test only   Ordering Physician:   Dr. Bartholome Bill    Clinical History:  66 y.o. year old male  Vitals: Height: 73 in Weight: 248 lb  Cardiac risk factors include:     Hyperlipidemia, Diabetes and HTN    Procedure:   Pharmacologic stress testing was performed with Regadenoson using a single  use 0.4mg /47ml (0.08 mg/ml) prefilled syringe intravenously infused as a  bolus dose over 10-15 seconds. The stress test was stopped  due to Infusion  completion.  Blood pressure response was normal.  The patient developed  symptoms other than fatigue during the procedure; specific symptoms  included PROLONGED AND PROGESSIVE CHEST PRESSURE WITH VOMITTING.   Rest HR: 63bpm  Rest BP: 130/41mmHg  Max HR: 96bpm  Min BP: 180/72mmHg   Stress Test Administered by: Oswald Hillock, CMA   ECG Interpretation:  Rest ECG:  normal sinus rhythm, none  Stress ECG:  normal sinus rhythm, nonspecific ST-T wave changes  Recovery ECG:  normal sinus rhythm  ECG Interpretation:  non-diagnostic due to pharmacologic testing.    Administrations This Visit     regadenoson (LEXISCAN) 0.4 mg/5 mL inj syringe 0.4 mg     Admin Date  06/03/2020 Action  Given Dose  0.4 mg Route  Intravenous Administered By  Herbert Seta, CNMT     technetium Tc49m sestamibi (CARDIOLITE) injection 06.30 millicurie     Admin Date  06/03/2020 Action  Given Dose  16.01 millicurie Route  Intravenous Administered By  Herbert Seta, CNMT     technetium Tc61m sestamibi (CARDIOLITE) injection 09.32 millicurie     Admin Date  06/03/2020 Action  Given Dose  35.57 millicurie Route  Intravenous Administered By  Herbert Seta, CNMT   Gated post-stress perfusion imaging was performed 30 minutes after stress.  Rest images were performed 30 minutes after injection.   Gated LV Analysis:   Summary of LV Perfusion: AbnormalWall motion the inferior wall,   Summary of LV Function: Abnormal    TID Ratio: 1.08   LVEF= 40%   FINDINGS:  Regional wall motion:  demonstrates  hypokinesis of the Inferior wall.  The overall quality of the study is good.    Artifacts noted: no  Left ventricular cavity: normal.   ECHO 06/03/2020 ECHOCARDIOGRAPHIC MEASUREMENTS  2D DIMENSIONS  AORTA                  Values   Normal Range   MAIN PA         Values    Normal Range                Annulus: nm*          [2.3-2.9]         PA Main: nm*       [1.5-2.1]              Aorta Sin: 4.2 cm       [3.1-3.7]    RIGHT VENTRICLE            ST  Junction: nm*          [2.6-3.2]         RV Base: 4.8 cm    [< 4.2]              Asc.Aorta: nm*          [2.6-3.4]          RV Mid: 3.8 cm    [< 3.5]  LEFT VENTRICLE                                      RV Length: nm*       [<8.6]                  LVIDd: 6.3 cm       [4.2-5.9]    INFERIOR VENA CAVA  LVIDs: 4.5 cm                        Max. IVC: nm*       [<=2.1]                     FS: 28.9 %       [>25]            Min. IVC: nm*                    SWT: 1.3 cm       [0.6-1.0]    ------------------                    PWT: 1.2 cm       [0.6-1.0]    nm* - not measured  LEFT ATRIUM                LA Diam: 4.6 cm       [3.0-4.0]            LA A4C Area: nm*          [<20]              LA Volume: nm*          [18-58]  _________________________________________________________________________________________  ECHOCARDIOGRAPHIC DESCRIPTIONS  AORTIC ROOT                   Size: MILDLY DILATED             Dissection: INDETERM FOR DISSECTION  AORTIC VALVE               Leaflets: Tricuspid                   Morphology: Normal               Mobility: Fully mobile  LEFT VENTRICLE                   Size: Normal                        Anterior: Normal            Contraction: Normal                         Lateral: Normal             Closest EF: >55% (Estimated)                Septal: Normal              LV Masses: No Masses                       Apical: Normal                    LVH: MILD LVH CONCENTRIC           Inferior: Normal                                                      Posterior: Normal           Dias.FxClass: (Grade 1) relaxation abnormal, E/A reversal  MITRAL  VALVE               Leaflets: Normal                        Mobility: Fully mobile             Morphology: Normal  LEFT ATRIUM                   Size: MILDLY ENLARGED              LA Masses: No masses              IA Septum: Normal IAS  MAIN PA                   Size: Normal  PULMONIC VALVE              Morphology: Normal                        Mobility: Fully mobile  RIGHT VENTRICLE                   Size: MILDLY ENLARGED              Free Wall: Normal            Contraction: Normal                       RV Masses: No Masses  TRICUSPID VALVE               Leaflets: Normal                        Mobility: Fully mobile             Morphology: Normal  RIGHT ATRIUM                   Size: MILDLY ENLARGED               RA Other: None                RA Mass: No masses  PERICARDIUM                  Fluid: No effusion  INFERIOR VENACAVA                   Size: Normal Normal respiratory collapse  _________________________________________________________________________________________   DOPPLER ECHO and OTHER SPECIAL PROCEDURES                 Aortic: No AR                      No AS                         147.3 cm/sec peak vel      8.7 mmHg peak grad                 Mitral: TRIVIAL MR                 No MS                         MV Inflow E Vel = 85.7 cm/sec      MV Annulus E'Vel = nm*  E/E'Ratio = nm*              Tricuspid: TRIVIAL TR                 No TS              Pulmonary: TRIVIAL PR                 No PS  _________________________________________________________________________________________  INTERPRETATION  NORMAL LEFT VENTRICULAR SYSTOLIC FUNCTION   WITH MILD LVH  NORMAL RIGHT VENTRICULAR SYSTOLIC FUNCTION  MILD VALVULAR REGURGITATION (See above)  NO VALVULAR STENOSIS  LVH: MILD LVH  Closest EF: >55% (Estimated)  Mitral: TRIVIAL MR  Tricuspid: TRIVIAL TR  _________________________________________________________________________________________  Electronically signed by            MD Jordan Hawks on 06/03/2020 04: 72 PM           Performed By: Johnathan Hausen, RDCS, RVT     Ordering Physician: Ubaldo Glassing, MD Ashley Akin reviewed by me (LT) 08/06/2022 : Sinus bradycardia in the 50s to sinus rhythm in the 60s, brief run of NSVT  EKG reviewed by me:  shows sinus rhythm with poor R wave progression and a nonspecific IVCD without acute ischemia.  Data reviewed by me (LT) 08/06/2022: Last outpatient cardiology note, last nephrology note, admission H&P, ED note, CBC, BMP, troponins, ordered BNP, vitals, telemetry.  Principal Problem:   NSTEMI (non-ST elevated myocardial infarction) ([place]) Active Problems:   Obesity (BMI 30-39.9)   DM2 (diabetes mellitus, type 2) (HCC)   HTN (hypertension)   CKD (chronic kidney disease), stage V ([place])    ASSESSMENT AND PLAN:  Lee Bryant. Lee Bryant. is a 66 year old male with a PMH of abnormal Lexiscan Myoview 05/2020 with moderate area of inferior hypoperfusion and EF 40% (medically managed), CKD 5 (Cr 4.8-5.5, GFR 11 Dr. Murlean Iba, referred for [facility] transplant center for evaluation 06/2022) secondary to DM 2 (A1c 6.1% 02/2022) anemia of chronic disease, essential hypertension who presented to [facility] ED 08/05/2022 with worsening dyspnea on exertion and increased frequency and severity of his chronic angina. Cardiology is consulted for further assistance.  #NSTEMI Presents with worsening dyspnea on exertion and increased frequency of his episodes of angina (chest tightness, shortness of breath) that went from occurring once monthly for the past several years, to now happening at least once weekly.  Yesterday he describes having substernal chest pain that is 7/10 that radiated to his jaw, both hands, associated with dyspnea and resolved after 30 minutes of rest.  This was the worst episode he had ever had and he was very concerned about it.  In the ED his initial troponin was elevated at 459, and his had a relatively flat trend at [phone] thereafter.  His EKG shows sinus rhythm with poor R wave progression and a nonspecific IVCD without acute ischemia.  He has been chest pain-free since admission.  His worsening angina with an elevated troponin is consistent with an NSTEMI, for which further evaluation with a left  heart cath is recommended.  We will need to coordinate with nephrology prior to [facility] due to his creatinine of 4.95 and GFR of 12 and arrange for immediate dialysis afterwards. -S/p 325 mg aspirin, continue 81 mg aspirin daily -Continue heparin drip for 48 hours or until left heart cath. -Uptitrate isosorbide 30 mg to 60 mg once daily -Continue Coreg 3.125 mg twice daily, continue other antihypertensives with amlodipine 7.5 mg daily, losartan 100 mg daily, and clonidine  0.3 mg twice daily -Continue Crestor 40, fenofibrate 54 mg once daily -Echocardiogram complete performed this morning, pending read -He understands the risk, benefits, potential outcomes of left heart cath.  As above, will arrange with nephrology (Dr. Theador Hawthorne has been consulted) and hopefully perform LHC tomorrow 10/20, if not on Monday 10/23.  Continue medical management until then.  #CKD 5 Current creatinine 4.95 and GFR 12, patient is still making urine.  In the process of setting up home dialysis on an outpatient basis, has appointment with [facility] transplant on November 16. -Will need dialysis access/coordination prior to [facility].  #Type 2 diabetes A1c from 02/2022 was great at 6.1%.  Recheck tomorrow morning.  #Anemia of chronic disease Hemoglobin with a downtrend overnight from 8.3-7.1, consider transfusion to maintain Hgb greater than 8.  This patient's plan of care was discussed and created with Dr. Clayborn Bigness and he is in agreement.  Signed: Tristan Schroeder , PA-C 08/06/2022, 11:46 AM [facility]

## 2022-08-06 NOTE — Progress Notes (Signed)
*  PRELIMINARY RESULTS* Echocardiogram 2D Echocardiogram has been performed.  Lee Bryant 08/06/2022, 9:08 AM

## 2022-08-06 NOTE — Progress Notes (Addendum)
[facility]  ROUNDING NOTE   Subjective:   .Lee Bryant. Is a 66 y.o. male past medical conditions including diabetes, hypertension, hyperlipidemia, anemia, and chronic kidney disease stage V.  Patient presents to the emergency department with complaints of chest pain and has been admitted for NSTEMI (non-ST elevated myocardial infarction) [facility]) [I21.4]  Patient is known to our practice and receives outpatient nephrology follow-up with Dr. Candiss Norse.  Patient was last seen in office on July 01, 2022.  It was discussed at that time patient's advanced kidney disease, dialysis options and patient prefers home modalities.  Patient is seen and evaluated on stretcher in ED.  No family at bedside.  Currently receiving heparin drip.  Patient states he suffered a small heart attack yesterday.  Currently denies chest pain, shortness of breath, or discomfort.  Room air.  Patient does report 2-3 previous MIs within health history.  Continues to urinate.  Denies NSAID use.  Denies nausea, vomiting, or dehydration.  Labs on ED arrival significant for serum bicarb 21, 23, BUN 76, creatinine 5.02 with GFR 12, troponin 469, hemoglobin 8.3.  Chest x-ray shows no acute changes.  Troponins are trending down.  Renal function has progressively decreased over the past few months, baseline creatinine 4.8 with GFR 12 on May 12, 2022.  Cardiology consulted and considering left heart cath tomorrow.  We have been consulted to evaluate worsening kidney disease.  Patient was seen in the ER. Patient offers no complaint of chest pain.  Patient admitted his chest pain was better than before Patient offers no complaint of fever/cough/chills Patient offers no complaint of nausea or vomiting Patient family was present in the room I discussed with the patient about possible need for renal placement therapy after his cath   Objective:  Vital signs in last 24 hours:  Temp:  [97.9 F (36.6 C)-98.6 F (37  C)] 98.3 F (36.8 C) (10/19 0240) Pulse Rate:  [62-94] 72 (10/19 1100) Resp:  [10-21] 19 (10/19 1100) BP: (109-175)/(51-91) 146/91 (10/19 1137) SpO2:  [90 %-97 %] 96 % (10/19 1100) Weight:  [125.2 kg] 125.2 kg (10/18 1543)  Weight change:  Filed Weights   08/05/22 1543  Weight: 125.2 kg    Intake/Output: No intake/output data recorded.   Intake/Output this shift:  No intake/output data recorded.  Physical Exam: General: NAD  Head: Normocephalic, atraumatic. Moist oral mucosal membranes  Eyes: Anicteric  Lungs:  Clear to auscultation, normal effort  Heart: Regular rate and rhythm  Abdomen:  Soft, nontender, obese  Extremities: No peripheral edema.  Neurologic: Nonfocal, moving all four extremities  Skin: No lesions  Access: None    Basic Metabolic Panel: Recent Labs  Lab 08/05/22 1545 08/06/22 0303  NA 135 137  K 4.3 4.1  CL 106 110  CO2 21* 19*  GLUCOSE 123* 165*  BUN 76* 69*  CREATININE 5.02* 4.95*  CALCIUM 9.4 8.9    Liver Function Tests: No results for input(s): "AST", "ALT", "ALKPHOS", "BILITOT", "PROT", "ALBUMIN" in the last 168 hours. No results for input(s): "LIPASE", "AMYLASE" in the last 168 hours. No results for input(s): "AMMONIA" in the last 168 hours.  CBC: Recent Labs  Lab 08/05/22 1545 08/06/22 0303  WBC 7.6 7.1  HGB 8.3* 7.1*  HCT 26.9* 23.2*  MCV 87.6 86.9  PLT 238 216    Cardiac Enzymes: No results for input(s): "CKTOTAL", "CKMB", "CKMBINDEX", "TROPONINI" in the last 168 hours.  BNP: Invalid input(s): "POCBNP"  CBG: Recent Labs  Lab 08/05/22  2206 08/06/22 0806 08/06/22 1313  GLUCAP 117* 118* 106*    Microbiology: Results for orders placed or performed during the hospital encounter of 03/12/17  MRSA PCR Screening     Status: None   Collection Time: 03/12/17  1:26 PM   Specimen: Nasal Mucosa; Nasopharyngeal  Result Value Ref Range Status   MRSA by PCR NEGATIVE NEGATIVE Final    Comment:        The GeneXpert MRSA  Assay (FDA approved for NASAL specimens only), is one component of a comprehensive MRSA colonization surveillance program. It is not intended to diagnose MRSA infection nor to guide or monitor treatment for MRSA infections.   Aerobic Culture  (superficial specimen)     Status: None   Collection Time: 03/12/17  6:00 PM   Specimen: Ankle; Wound  Result Value Ref Range Status   Specimen Description ANKLE RIGHT ANKLE AND BACK OF ANKLE ULCERS  Final   Special Requests Normal  Final   Gram Stain   Final    MODERATE WBC PRESENT, PREDOMINANTLY PMN ABUNDANT GRAM NEGATIVE RODS MODERATE GRAM POSITIVE COCCI IN PAIRS IN CLUSTERS FEW GRAM POSITIVE RODS    Culture   Final    ABUNDANT STAPHYLOCOCCUS AUREUS WITHIN MIXED CULTURE Performed at [facility], [place] [address] [place], [place] [address]    Report Status 03/15/2017 FINAL  Final   Organism ID, Bacteria STAPHYLOCOCCUS AUREUS  Final      Susceptibility   Staphylococcus aureus - MIC*    CIPROFLOXACIN >=8 RESISTANT Resistant     ERYTHROMYCIN >=8 RESISTANT Resistant     GENTAMICIN <=0.5 SENSITIVE Sensitive     OXACILLIN 0.5 SENSITIVE Sensitive     TETRACYCLINE <=1 SENSITIVE Sensitive     VANCOMYCIN 1 SENSITIVE Sensitive     TRIMETH/SULFA <=10 SENSITIVE Sensitive     CLINDAMYCIN <=0.25 SENSITIVE Sensitive     RIFAMPIN <=0.5 SENSITIVE Sensitive     Inducible Clindamycin NEGATIVE Sensitive     * ABUNDANT STAPHYLOCOCCUS AUREUS    Coagulation Studies: Recent Labs    08/05/22 1743  LABPROT 14.6  INR 1.2    Urinalysis: No results for input(s): "COLORURINE", "LABSPEC", "PHURINE", "GLUCOSEU", "HGBUR", "BILIRUBINUR", "KETONESUR", "PROTEINUR", "UROBILINOGEN", "NITRITE", "LEUKOCYTESUR" in the last 72 hours.  Invalid input(s): "APPERANCEUR"    Imaging: DG Chest 2 View  Result Date: 08/05/2022 CLINICAL DATA:  Chest pain EXAM: CHEST - 2 VIEW COMPARISON:  None Available. FINDINGS: Mild cardiac enlargement with normal pulmonary  vascularity. Lungs clear without infiltrate or effusion Advanced degenerative change in both shoulders. IMPRESSION: No active cardiopulmonary disease. Electronically Signed   By: Franchot Gallo M.D.   On: 08/05/2022 16:23     Medications:    sodium chloride 10 mL/hr at 08/05/22 2213   heparin 1,700 Units/hr (08/06/22 0803)    amLODipine  7.5 mg Oral Daily   aspirin EC  81 mg Oral Daily   carvedilol  3.125 mg Oral BID WC   cloNIDine  0.3 mg Oral BID   doxazosin  4 mg Oral QHS   DULoxetine  30 mg Oral Daily   fenofibrate  54 mg Oral Daily   ferrous gluconate  324 mg Oral Q breakfast   gabapentin  600 mg Oral TID   hydrALAZINE  100 mg Oral TID   insulin aspart  0-20 Units Subcutaneous TID WC   insulin glargine-yfgn  10 Units Subcutaneous QHS   isosorbide mononitrate  30 mg Oral Once   [START ON 08/07/2022] isosorbide mononitrate  60 mg  Oral Daily   losartan  100 mg Oral Daily   rOPINIRole  0.5 mg Oral QHS   rosuvastatin  40 mg Oral Daily   acetaminophen, cyclobenzaprine, HYDROcodone-acetaminophen, nitroGLYCERIN, ondansetron (ZOFRAN) IV  Assessment/ Plan:  Mr. Loki Wuthrich. is a 66 y.o.  male past medical conditions including diabetes, hypertension, hyperlipidemia, anemia, and chronic kidney disease stage V.  Patient presents to the emergency department with complaints of chest pain and has been admitted for NSTEMI (non-ST elevated myocardial infarction) ([place]) [I21.4]   Acute Kidney Injury on chronic kidney disease stage V with baseline creatinine 4.8 and GFR of 12 on 05/12/22.  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  No recent IV contrast exposure.  Continue holding furosemide and metformin.  Progressive renal function deterioration noted outpatient.  Kidney transplant work-up underway.  Patient prefers home modalities and was preparing for peritoneal dialysis evaluation.  No acute need for dialysis at  this time.  We will continue to monitor.   Lab Results  Component Value Date   CREATININE 4.95 (H) 08/06/2022   CREATININE 5.02 (H) 08/05/2022   CREATININE 1.01 03/13/2017   No intake or output data in the 24 hours ending 08/06/22 1415  2. Anemia of chronic kidney disease Lab Results  Component Value Date   HGB 7.1 (L) 08/06/2022    Hemoglobin below desired target.  Will defer to primary team need for blood transfusion.  3. Secondary Hyperparathyroidism: Presumed  PTH 71, phosphorus 4.7, calcium 9.4 on 06/24/22.   Lab Results  Component Value Date   CALCIUM 8.9 08/06/2022    We will continue to monitor bone minerals during this admission.  4. 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 Home regimen includes glimepiride and metformin. Most recent hemoglobin A1c is 5.5 on 08/05/2022.   5.  NSTEMI, History of angina with increasing frequency.  Cardiology consulting and recommending left heart cath tomorrow.  Outpatient work-up underway to initiate dialysis.  Due to this, we will recommend cardiology pursuing left heart cath   I saw and evaluated the patient with Colon Flattery, NP.  I personally formulated the plan of care.  I agree with the findings and plan as documented in the note except as noted .     LOS: [place] 10/19/20232:15 PM

## 2022-08-06 NOTE — ED Notes (Signed)
Heparin verified by Angelina Sheriff, RN.

## 2022-08-06 NOTE — ED Notes (Signed)
Pt up to use restroom with steady gait, placed onto hospital admission bed and comfortable, call light within reach.

## 2022-08-06 NOTE — Progress Notes (Signed)
ANTICOAGULATION CONSULT NOTE  Pharmacy Consult for Heparin drip  Indication: chest pain/ACS  Allergies  Allergen Reactions   Cephalexin Rash    Patient Measurements: Height: 6\' 1"  (185.4 cm) Weight: 125.2 kg (276 lb) IBW/kg (Calculated) : 79.9 Heparin Dosing Weight: 107.5 kg  Vital Signs: BP: 151/66 (10/19 1500) Pulse Rate: 83 (10/19 1500)  Labs: Recent Labs    08/05/22 1545 08/05/22 1743 08/05/22 1745 08/05/22 2209 08/06/22 0009 08/06/22 0303 08/06/22 1510  HGB 8.3*  --   --   --   --  7.1*  --   HCT 26.9*  --   --   --   --  23.2*  --   PLT 238  --   --   --   --  216  --   APTT  --  37*  --   --   --   --   --   LABPROT  --  14.6  --   --   --   --   --   INR  --  1.2  --   --   --   --   --   HEPARINUNFRC  --   --   --   --   --  <0.10* 0.23*  CREATININE 5.02*  --   --   --   --  4.95*  --   TROPONINIHS 459*  --  383* 391* 337*  --   --      Estimated Creatinine Clearance: 20.1 mL/min (A) (by C-G formula based on SCr of 4.95 mg/dL (H)).   Medical History: Past Medical History:  Diagnosis Date   CAD (coronary artery disease)    had abnormal nuclear stress test 2021 or 2022   Cellulitis    CKD (chronic kidney disease), stage V (HCC)    Decubitus ulcer of sacral region, stage 4 ([place])    resolved - required multiple surgical interventions   Diabetes mellitus ([place])    type II   HLD (hyperlipidemia)    Hypertension    Obesity (BMI 30-39.9)     Medications:  (Not in a hospital admission) Scheduled:   amLODipine  7.5 mg Oral Daily   aspirin EC  81 mg Oral Daily   carvedilol  3.125 mg Oral BID WC   cloNIDine  0.3 mg Oral BID   doxazosin  4 mg Oral QHS   DULoxetine  30 mg Oral Daily   fenofibrate  54 mg Oral Daily   ferrous gluconate  324 mg Oral Q breakfast   gabapentin  600 mg Oral TID   hydrALAZINE  100 mg Oral TID   insulin aspart  0-20 Units Subcutaneous TID WC   insulin glargine-yfgn  10 Units Subcutaneous QHS   isosorbide mononitrate  30 mg  Oral Once   [START ON 08/07/2022] isosorbide mononitrate  60 mg Oral Daily   losartan  100 mg Oral Daily   rOPINIRole  0.5 mg Oral QHS   rosuvastatin  40 mg Oral Daily   Infusions:   sodium chloride 10 mL/hr at 08/05/22 2213   heparin 1,700 Units/hr (08/06/22 0803)    Assessment: 66 yo M with PMH HTN, HLD, obesity, T2DM, CKD (BL Scr 4), anemia of CKD, CAD presents with sudden onset chest pain associated with SOB, jaw pain, and headache. Troponin peak at 459. Pt not on chronic anticoagulation prior to admission. Hgb trending down, from 8.3 >> 7.1. Baseline Hgb appears to be 9 to 10.  Date Time  aPTT/HL Rate/Comment 10/19 1510 0.23  Subtherapeutic; 1700>>1900     Baseline Labs: aPTT - 37; INR - 1.2 Hgb - 8.3; Plts - 238  Goal of Therapy:  Heparin level 0.3-0.7 units/ml Monitor platelets by anticoagulation protocol: Yes   Plan: Bolus heparin 1600 units x 1 Increase heparin infusion rate to 1900 units/hr Check HL in 8 hours Monitor CBC daily while on heparin  Dara Hoyer, PharmD PGY-1 Pharmacy Resident 08/06/2022 4:12 PM

## 2022-08-06 NOTE — Progress Notes (Signed)
Progress Note   Patient: Lee Bryant. VWU:[id number] DOB: [birth year] DOA: 08/05/2022     1 DOS: the patient was seen and examined on 08/06/2022   Brief hospital course: Taken from H&P.  Lee Bryant is followed for CKD V by [facility], HTN, HLD, obesity, DM2, renal related anemia. ON the morning of admission when walking to his care he had an episode of crushing chest pain with radiation of pain to the neck and acute dyspnea. He rested and his symptoms abated. He proceeded to see his PCP for acute chest pain. He was referred to [facility] for further evaluation with a high risk profile for ACS.    ED Course: afebrile. 155/66  HR 81  RR 16 BMI 36.4. Patient was in no distress at admission. Lab: glucose 123  BUN 76, Cr 5.02, Hgb 8.3 (chronic). CXR- NAD, EKG NSR, w/o ST elevation. Troponin #1 459, #2 383.  Patient was started on heparin infusion and cardiology was consulted.  10/19: Patient was experiencing intermittent chest pain with exertion, relieved with rest, gradually worsening.  Radiating to left jaw and sometimes her left arm and associated with shortness of breath which is very typical for ischemic pain.  Patient follow-up with his cardiologist and they are recommending further ischemic work-up with cardiac cath which is being delayed due to CKD stage V.  Message sent to nephrology as patient is recently being worked up for dialysis.  Patient might need to start dialysis for cardiac catheterization.  Cardiology to coordinate with nephrology before planning cardiac catheterization.     Assessment and Plan: * NSTEMI (non-ST elevated myocardial infarction) ([place]) Patient with multiple risk factors: DM, HTN, Obesity, CKD V with chronic anemia. Patient reports he had a nuclear stress test 1-2 years ago revealing CAD which has been managed medically. Over the last month he has had progressive DOE and reports 2-3 episodes of exertional chest pain that resolved with rest.  On the  morning of admission he had another episode of crushing chest pain with radiation to the jaw and neck with acute dyspnea that was more severe than previous episodes consistent with unstable angina. He went to his PCP who referred him to [facility] for evaluation. EKG w/o acute STEMI; troponin #1 459, #2 383.   Cardiology was consulted-concern of angina and ACS. Patient need cardiac catheterization and have to be coordinated with nephrology as they are planning to start him on dialysis.  Nephrology was also consulted. -Continue with heparin infusion for now  CKD (chronic kidney disease), stage V (HCC) Progressive disease 2/2 diabetes. Followed by [employer] [employer]. NOT on HD. Cr is stable compared to outpatient lab noted in last nephrology note.   -Nephrology is consulted as patient need to be started on dialysis for cardiac catheterization. -Monitor renal function -Avoid nephrotoxins  DM2 (diabetes mellitus, type 2) ([place]) Seems well controlled with A1c of 5.5.  Patient was using only Amaryl at home. -Continue with basal and SSI  HTN (hypertension) Long standing problem. Currently  on ARB, CCB, peripheral dilator.  Plan Continue outpatient meds  Add beta-blocker    Obesity (BMI 30-39.9) Estimated body mass index is 36.41 kg/m as calculated from the following:   Height as of this encounter: 6\' 1"  (1.854 m).   Weight as of this encounter: 125.2 kg.   -This will complicate overall prognosis -Might get benefit by adding outpatient Ozempic   Subjective: Patient was seen and examined today.  No current chest pain or shortness of breath.  Multiple episodes with exertional chest pain associated with shortness of breath, radiating to left arm or jaw and improved with rest.  Physical Exam: Vitals:   08/06/22 0900 08/06/22 1000 08/06/22 1100 08/06/22 1137  BP: (!) 157/77 (!) 169/85 (!) 146/91 (!) 146/91  Pulse: 85  72   Resp:   19   Temp:      TempSrc:      SpO2: 92%  96%    Weight:      Height:       General.  Obese gentleman, in no acute distress. Pulmonary.  Lungs clear bilaterally, normal respiratory effort. CV.  Regular rate and rhythm, no JVD, rub or murmur. Abdomen.  Soft, nontender, nondistended, BS positive. CNS.  Alert and oriented .  No focal neurologic deficit. Extremities.  No edema, no cyanosis, pulses intact and symmetrical. Psychiatry.  Judgment and insight appears normal.  Data Reviewed: Prior data reviewed  Family Communication: Discussed with patient, called wife with no response.  No message left due to generic voicemail.  Disposition: Status is: Inpatient Remains inpatient appropriate because: Severity of illness   Planned Discharge Destination: Home  DVT prophylaxis.  Heparin GTT Time spent: 50 minutes  This record has been created using Systems analyst. Errors have been sought and corrected,but may not always be located. Such creation errors do not reflect on the standard of care.  Author: Lorella Nimrod, MD 08/06/2022 2:09 PM  For on call review [email or web address].

## 2022-08-06 NOTE — Hospital Course (Addendum)
Taken from H&P.  Lee Bryant is followed for CKD V by [facility], HTN, HLD, obesity, DM2, renal related anemia. ON the morning of admission when walking to his care he had an episode of crushing chest pain with radiation of pain to the neck and acute dyspnea. He rested and his symptoms abated. He proceeded to see his PCP for acute chest pain. He was referred to [facility] for further evaluation with a high risk profile for ACS.    ED Course: afebrile. 155/66  HR 81  RR 16 BMI 36.4. Patient was in no distress at admission. Lab: glucose 123  BUN 76, Cr 5.02, Hgb 8.3 (chronic). CXR- NAD, EKG NSR, w/o ST elevation. Troponin #1 459, #2 383.  Patient was started on heparin infusion and cardiology was consulted.  10/19: Patient was experiencing intermittent chest pain with exertion, relieved with rest, gradually worsening.  Radiating to left jaw and sometimes her left arm and associated with shortness of breath which is very typical for ischemic pain.  Patient follow-up with his cardiologist and they are recommending further ischemic work-up with cardiac cath which is being delayed due to CKD stage V.  Message sent to nephrology as patient is recently being worked up for dialysis.  Patient might need to start dialysis for cardiac catheterization.  Cardiology to coordinate with nephrology before planning cardiac catheterization.  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  70 to 90% stenosis of circumflex . No stent was placed today.  Cardiology is currently recommending medical management and reattempt for circumflex stent placement at a later time. Patient received high doses of contrast due to difficult case and most likely will need dialysis after this procedure.  Hemoglobin dropped to 6.9 this morning.  No obvious bleeding.  It was 7.1 yesterday. 2 unit of PRBC ordered for concern of recent  ACS.  10/21: Hemodynamically stable.  Started on dialysis after the cardiac catheterization yesterday. Anemia panel with anemia of chronic disease.  Received 1 unit of PRBC instead of 2. Hemoglobin today at 8.7.  Received second dialysis today.    10/22: Patient remained stable and tolerated 2 treatments of dialysis well.  Third treatment tomorrow.  Will need outpatient established dialysis chair versus set up of home dialysis before discharge.  No chest pain.  Stable for discharge from cardiac standpoint with outpatient follow-up for further ischemic management.  Most likely will need bypass versus PCI.  10/23: Patient remained medically stable.  Received third treatment of dialysis.  Nephrology is recommending discharge and they will follow-up closely as an outpatient for setting up home dialysis.  Patient need a PD catheter placed which will be done as an outpatient.  Patient was started on few new medications by his cardiologist and need to have a close follow-up with his providers for further recommendations.

## 2022-08-07 ENCOUNTER — Encounter
Admission: EM | Disposition: A | Discharge status: Home / Self Care | Payer: Self-pay | Source: Home / Self Care | Attending: Internal Medicine

## 2022-08-07 DIAGNOSIS — N185 Chronic kidney disease, stage 5: Secondary | ICD-10-CM

## 2022-08-07 DIAGNOSIS — E1122 Type 2 diabetes mellitus with diabetic chronic kidney disease: Secondary | ICD-10-CM

## 2022-08-07 DIAGNOSIS — I120 Hypertensive chronic kidney disease with stage 5 chronic kidney disease or end stage renal disease: Secondary | ICD-10-CM

## 2022-08-07 DIAGNOSIS — Z9889 Other specified postprocedural states: Secondary | ICD-10-CM

## 2022-08-07 DIAGNOSIS — N179 Acute kidney failure, unspecified: Secondary | ICD-10-CM

## 2022-08-07 DIAGNOSIS — Z87891 Personal history of nicotine dependence: Secondary | ICD-10-CM

## 2022-08-07 DIAGNOSIS — D631 Anemia in chronic kidney disease: Secondary | ICD-10-CM | POA: Diagnosis present

## 2022-08-07 HISTORY — PX: TEMPORARY DIALYSIS CATHETER: CATH118312

## 2022-08-07 HISTORY — PX: LEFT HEART CATH AND CORONARY ANGIOGRAPHY: CATH118249

## 2022-08-07 LAB — BASIC METABOLIC PANEL
Anion gap: 4 — ABNORMAL LOW (ref 5–15)
BUN: 67 mg/dL — ABNORMAL HIGH (ref 8–23)
CO2: 23 mmol/L (ref 22–32)
Calcium: 8.7 mg/dL — ABNORMAL LOW (ref 8.9–10.3)
Chloride: 109 mmol/L (ref 98–111)
Creatinine, Ser: 4.89 mg/dL — ABNORMAL HIGH (ref 0.61–1.24)
GFR, Estimated: 12 mL/min — ABNORMAL LOW (ref >60)
Glucose, Bld: 113 mg/dL — ABNORMAL HIGH (ref 70–99)
Potassium: 4.4 mmol/L (ref 3.5–5.1)
Sodium: 136 mmol/L (ref 135–145)

## 2022-08-07 LAB — GLUCOSE, CAPILLARY: Glucose-Capillary: 94 mg/dL (ref 70–99)

## 2022-08-07 LAB — IRON AND TIBC
Iron: 71 ug/dL (ref 45–182)
Saturation Ratios: 19 % (ref 17.9–39.5)
TIBC: 367 ug/dL (ref 250–450)
UIBC: 296 ug/dL

## 2022-08-07 LAB — CBC
HCT: 23.1 % — ABNORMAL LOW (ref 39.0–52.0)
HCT: 25.2 % — ABNORMAL LOW (ref 39.0–52.0)
Hemoglobin: 6.9 g/dL — ABNORMAL LOW (ref 13.0–17.0)
Hemoglobin: 8.1 g/dL — ABNORMAL LOW (ref 13.0–17.0)
MCH: 26.4 pg (ref 26.0–34.0)
MCH: 27.2 pg (ref 26.0–34.0)
MCHC: 29.9 g/dL — ABNORMAL LOW (ref 30.0–36.0)
MCHC: 32.1 g/dL (ref 30.0–36.0)
MCV: 88.5 fL (ref 80.0–100.0)
MCV: 84.6 fL (ref 80.0–100.0)
Platelets: 203 10*3/uL (ref 150–400)
Platelets: 205 10*3/uL (ref 150–400)
RBC: 2.61 MIL/uL — ABNORMAL LOW (ref 4.22–5.81)
RBC: 2.98 MIL/uL — ABNORMAL LOW (ref 4.22–5.81)
RDW: 14.5 % (ref 11.5–15.5)
RDW: 14.7 % (ref 11.5–15.5)
WBC: 7.1 10*3/uL (ref 4.0–10.5)
WBC: 7.6 10*3/uL (ref 4.0–10.5)
nRBC: 0 % (ref 0.0–0.2)
nRBC: 0 % (ref 0.0–0.2)

## 2022-08-07 LAB — HEPATITIS B CORE ANTIBODY, TOTAL

## 2022-08-07 LAB — HEPARIN LEVEL (UNFRACTIONATED): Heparin Unfractionated: 0.29 IU/mL — ABNORMAL LOW (ref 0.30–0.70)

## 2022-08-07 LAB — CBG MONITORING, ED: Glucose-Capillary: 113 mg/dL — ABNORMAL HIGH (ref 70–99)

## 2022-08-07 LAB — VITAMIN B12: Vitamin B-12: 1270 pg/mL — ABNORMAL HIGH (ref 180–914)

## 2022-08-07 LAB — RETICULOCYTES
Immature Retic Fract: 20 % — ABNORMAL HIGH (ref 2.3–15.9)
RBC.: 2.75 MIL/uL — ABNORMAL LOW (ref 4.22–5.81)
Retic Count, Absolute: 48.7 10*3/uL (ref 19.0–186.0)
Retic Ct Pct: 1.8 % (ref 0.4–3.1)

## 2022-08-07 LAB — HIV ANTIBODY (ROUTINE TESTING W REFLEX): HIV Screen 4th Generation wRfx: NONREACTIVE

## 2022-08-07 LAB — CARDIAC CATHETERIZATION: Cath EF Quantitative: 40 %

## 2022-08-07 LAB — PREPARE RBC (CROSSMATCH)

## 2022-08-07 LAB — LIPOPROTEIN A (LPA): Lipoprotein (a): 167.8 nmol/L — ABNORMAL HIGH (ref <75.0)

## 2022-08-07 LAB — FERRITIN: Ferritin: 98 ng/mL (ref 24–336)

## 2022-08-07 LAB — HEPATITIS B E ANTIBODY: Hep B E Ab: NEGATIVE

## 2022-08-07 LAB — HEPATITIS B SURFACE ANTIGEN

## 2022-08-07 LAB — HEPATITIS B SURFACE ANTIBODY, QUANTITATIVE: Hep B S AB Quant (Post): <3.1 m[IU]/mL — ABNORMAL LOW (ref >9.9)

## 2022-08-07 LAB — ABO/RH: ABO/RH(D): A POS

## 2022-08-07 LAB — FOLATE: Folate: 19.7 ng/mL (ref >5.9)

## 2022-08-07 SURGERY — TEMPORARY DIALYSIS CATHETER
Anesthesia: Moderate Sedation | Laterality: Right

## 2022-08-07 SURGERY — LEFT HEART CATH AND CORONARY ANGIOGRAPHY
Anesthesia: Moderate Sedation

## 2022-08-07 MED ORDER — VERAPAMIL HCL 2.5 MG/ML IV SOLN
INTRAVENOUS | Status: AC
  Filled 2022-08-07: qty 2

## 2022-08-07 MED ORDER — ONDANSETRON HCL 4 MG/2ML IJ SOLN: 4.0000 mg | Freq: Four times a day (QID) | INTRAMUSCULAR | Status: DC | PRN

## 2022-08-07 MED ORDER — MIDAZOLAM HCL 2 MG/2ML IJ SOLN
INTRAMUSCULAR | Status: AC
  Filled 2022-08-07: qty 2

## 2022-08-07 MED ORDER — LABETALOL HCL 5 MG/ML IV SOLN: 10.0000 mg | INTRAVENOUS | Status: AC | PRN

## 2022-08-07 MED ORDER — LIDOCAINE-PRILOCAINE 2.5-2.5 % EX CREA: 1.0000 | TOPICAL_CREAM | CUTANEOUS | Status: DC | PRN

## 2022-08-07 MED ORDER — ANTICOAGULANT SODIUM CITRATE 4% (200MG/5ML) IV SOLN: 5.0000 mL | Status: DC | PRN

## 2022-08-07 MED ORDER — SODIUM CHLORIDE 0.9 % IV SOLN: 250.0000 mL | INTRAVENOUS | Status: DC | PRN

## 2022-08-07 MED ORDER — ALTEPLASE 2 MG IJ SOLR: 2.0000 mg | Freq: Once | INTRAMUSCULAR | Status: DC | PRN

## 2022-08-07 MED ORDER — HYDRALAZINE HCL 20 MG/ML IJ SOLN: 10.0000 mg | INTRAMUSCULAR | Status: AC | PRN

## 2022-08-07 MED ORDER — MELATONIN 5 MG PO TABS
5.0000 mg | ORAL_TABLET | Freq: Every day | ORAL | Status: DC
  Administered 2022-08-07 – 2022-08-10 (×4): 5 mg via ORAL
  Filled 2022-08-07 (×4): qty 1

## 2022-08-07 MED ORDER — HEPARIN SODIUM (PORCINE) 1000 UNIT/ML DIALYSIS: 1000.0000 [IU] | INTRAMUSCULAR | Status: DC | PRN

## 2022-08-07 MED ORDER — HEPARIN SODIUM (PORCINE) 1000 UNIT/ML IJ SOLN
INTRAMUSCULAR | Status: AC
  Filled 2022-08-07: qty 10

## 2022-08-07 MED ORDER — HEPARIN BOLUS VIA INFUSION
1600.0000 [IU] | Freq: Once | INTRAVENOUS | Status: AC
  Administered 2022-08-07: 1600 [IU] via INTRAVENOUS
  Filled 2022-08-07: qty 1600

## 2022-08-07 MED ORDER — ACETAMINOPHEN 325 MG PO TABS: 650.0000 mg | ORAL_TABLET | ORAL | Status: DC | PRN

## 2022-08-07 MED ORDER — HEPARIN (PORCINE) IN NACL 1000-0.9 UT/500ML-% IV SOLN
INTRAVENOUS | Status: AC
  Filled 2022-08-07: qty 1000

## 2022-08-07 MED ORDER — LIDOCAINE HCL (PF) 1 % IJ SOLN
INTRAMUSCULAR | Status: DC | PRN
  Administered 2022-08-07: 5 mL

## 2022-08-07 MED ORDER — IOHEXOL 300 MG/ML  SOLN
INTRAMUSCULAR | Status: DC | PRN
  Administered 2022-08-07: 280 mL

## 2022-08-07 MED ORDER — FENTANYL CITRATE (PF) 100 MCG/2ML IJ SOLN
INTRAMUSCULAR | Status: AC
  Filled 2022-08-07: qty 2

## 2022-08-07 MED ORDER — SODIUM CHLORIDE 0.9 % IV SOLN: INTRAVENOUS | Status: DC

## 2022-08-07 MED ORDER — HEPARIN (PORCINE) 25000 UT/250ML-% IV SOLN
INTRAVENOUS | Status: AC
  Filled 2022-08-07: qty 250

## 2022-08-07 MED ORDER — HEPARIN (PORCINE) IN NACL 1000-0.9 UT/500ML-% IV SOLN
INTRAVENOUS | Status: DC | PRN
  Administered 2022-08-07: 1000 mL

## 2022-08-07 MED ORDER — SODIUM CHLORIDE 0.9% IV SOLUTION
Freq: Once | INTRAVENOUS | Status: DC
  Filled 2022-08-07: qty 250

## 2022-08-07 MED ORDER — SODIUM CHLORIDE 0.9% FLUSH
3.0000 mL | Freq: Two times a day (BID) | INTRAVENOUS | Status: DC
  Administered 2022-08-07 – 2022-08-10 (×7): 3 mL via INTRAVENOUS

## 2022-08-07 MED ORDER — VERAPAMIL HCL 2.5 MG/ML IV SOLN
INTRAVENOUS | Status: DC | PRN
  Administered 2022-08-07: 2.5 mg via INTRA_ARTERIAL

## 2022-08-07 MED ORDER — SODIUM CHLORIDE 0.9% FLUSH: 3.0000 mL | INTRAVENOUS | Status: DC | PRN

## 2022-08-07 MED ORDER — LIDOCAINE HCL (PF) 1 % IJ SOLN: 5.0000 mL | INTRAMUSCULAR | Status: DC | PRN

## 2022-08-07 MED ORDER — HEPARIN SODIUM (PORCINE) 1000 UNIT/ML IJ SOLN
INTRAMUSCULAR | Status: DC | PRN
  Administered 2022-08-07: 6000 [IU] via INTRAVENOUS

## 2022-08-07 MED ORDER — PENTAFLUOROPROP-TETRAFLUOROETH EX AERO: 1.0000 | INHALATION_SPRAY | CUTANEOUS | Status: DC | PRN

## 2022-08-07 MED ORDER — MIDAZOLAM HCL 2 MG/2ML IJ SOLN
INTRAMUSCULAR | Status: DC | PRN
  Administered 2022-08-07: 1 mg via INTRAVENOUS

## 2022-08-07 MED ORDER — FENTANYL CITRATE (PF) 100 MCG/2ML IJ SOLN
INTRAMUSCULAR | Status: DC | PRN
  Administered 2022-08-07: 25 ug via INTRAVENOUS

## 2022-08-07 MED ORDER — LIDOCAINE HCL 1 % IJ SOLN
INTRAMUSCULAR | Status: AC
  Filled 2022-08-07: qty 20

## 2022-08-07 MED ORDER — CHLORHEXIDINE GLUCONATE CLOTH 2 % EX PADS
6.0000 | MEDICATED_PAD | Freq: Every day | CUTANEOUS | Status: DC
  Administered 2022-08-09 – 2022-08-10 (×2): 6 via TOPICAL

## 2022-08-07 SURGICAL SUPPLY — 13 items
BAND ZEPHYR COMPRESS 30 LONG (HEMOSTASIS) IMPLANT
CATH 5FR JL3.5 JR4 ANG PIG MP (CATHETERS) IMPLANT
CATH INFINITI 5 FR MPA2 (CATHETERS) IMPLANT
CATH INFINITI 5FR JL4 (CATHETERS) IMPLANT
CATH VISTA GUIDE 6FR XB3.5 (CATHETERS) IMPLANT
DRAPE BRACHIAL (DRAPES) IMPLANT
GLIDESHEATH SLEND SS 6F .021 (SHEATH) IMPLANT
GUIDEWIRE INQWIRE 1.5J.035X260 (WIRE) IMPLANT
INQWIRE 1.5J .035X260CM (WIRE) ×1
PACK CARDIAC CATH (CUSTOM PROCEDURE TRAY) ×1 IMPLANT
PROTECTION STATION PRESSURIZED (MISCELLANEOUS) ×1
SET ATX SIMPLICITY (MISCELLANEOUS) IMPLANT
STATION PROTECTION PRESSURIZED (MISCELLANEOUS) IMPLANT

## 2022-08-07 SURGICAL SUPPLY — 4 items
BIOPATCH WHT 1IN DISK W/4.0 H (GAUZE/BANDAGES/DRESSINGS) IMPLANT
COVER PROBE ULTRASOUND 5X96 (MISCELLANEOUS) IMPLANT
KIT DIALYSIS CATH TRI 30X13 (CATHETERS) IMPLANT
PACK ANGIOGRAPHY (CUSTOM PROCEDURE TRAY) ×1 IMPLANT

## 2022-08-07 NOTE — Progress Notes (Signed)
Progress Note   Lee Bryant: Lee Bryant. BJY:[id number] DOB: [birth year] DOA: 08/05/2022     2 DOS: the Lee Bryant was seen and examined on 08/07/2022   Brief hospital course: Taken from H&P.  Lee Bryant is followed for CKD V by [facility], HTN, HLD, obesity, DM2, renal related anemia. ON the morning of admission when walking to his care he had an episode of crushing chest pain with radiation of pain to the neck and acute dyspnea. He rested and his symptoms abated. He proceeded to see his PCP for acute chest pain. He was referred to [facility] for further evaluation with a high risk profile for ACS.    ED Course: afebrile. 155/66  HR 81  RR 16 BMI 36.4. Lee Bryant was in no distress at admission. Lab: glucose 123  BUN 76, Cr 5.02, Hgb 8.3 (chronic). CXR- NAD, EKG NSR, w/o ST elevation. Troponin #1 459, #2 383.  Lee Bryant was started on heparin infusion and cardiology was consulted.  10/19: Lee Bryant was experiencing intermittent chest pain with exertion, relieved with rest, gradually worsening.  Radiating to left jaw and sometimes her left arm and associated with shortness of breath which is very typical for ischemic pain.  Lee Bryant follow-up with his cardiologist and they are recommending further ischemic work-up with cardiac cath which is being delayed due to CKD stage V.  Message sent to nephrology as Lee Bryant is recently being worked up for dialysis.  Lee Bryant might need to start dialysis for cardiac catheterization.  Cardiology to coordinate with nephrology before planning cardiac catheterization.  10/20: Lee Bryant underwent cardiac catheterization today which shows multivessel disease with 100% stenosis of RCA which is not amenable for any procedure.  70 to 90% stenosis of circumflex . No stent was placed today.  Cardiology is currently recommending medical management and reattempt for circumflex stent placement at a later time. Lee Bryant received high doses of contrast due to difficult case and  most likely will need dialysis after this procedure.  Hemoglobin dropped to 6.9 this morning.  No obvious bleeding.  It was 7.1 yesterday. 2 unit of PRBC ordered for concern of recent ACS.   Assessment and Plan: * NSTEMI (non-ST elevated myocardial infarction) ([place]) Lee Bryant with multiple risk factors: DM, HTN, Obesity, CKD V with chronic anemia. Lee Bryant reports he had a nuclear stress test 1-2 years ago revealing CAD which has been managed medically. Over the last month he has had progressive DOE and reports 2-3 episodes of exertional chest pain that resolved with rest.  On the morning of admission he had another episode of crushing chest pain with radiation to the jaw and neck with acute dyspnea that was more severe than previous episodes consistent with unstable angina. He went to his PCP who referred him to [facility] for evaluation. EKG w/o acute STEMI; troponin #1 459, #2 383.   Cardiology was consulted-concern of angina and ACS. Lee Bryant underwent cardiac catheterization which shows multivessel disease, no intervention was done today, cardiology is recommending medical management and reattempt for stent placement at a later time. -Continue with heparin infusion-cardiology will determine the duration. -Continue with aspirin, Imdur, and statin  CKD (chronic kidney disease), stage V (HCC) Progressive disease 2/2 diabetes. Followed by [facility]. NOT on HD. Cr is stable compared to outpatient lab noted in last nephrology note.   -Nephrology is consulted as Lee Bryant need to be started on dialysis for cardiac catheterization.  Lee Bryant received a lot of contrast today most likely will need dialysis now. -Monitor renal function -  Avoid nephrotoxins  DM2 (diabetes mellitus, type 2) ([place]) Seems well controlled with A1c of 5.5.  Lee Bryant was using only Amaryl at home. -Continue with basal and SSI  HTN (hypertension) Long standing problem. Currently  on ARB, CCB, peripheral  dilator.  Plan Continue outpatient meds  Add beta-blocker    Obesity (BMI 30-39.9) Estimated body mass index is 36.41 kg/m as calculated from the following:   Height as of this encounter: 6\' 1"  (1.854 m).   Weight as of this encounter: 125.2 kg.   -This will complicate overall prognosis -Might get benefit by adding outpatient Ozempic  Anemia due to chronic kidney disease Hemoglobin dropped to 6.9 this morning.  No obvious bleeding but Lee Bryant is on heparin infusion.  Most likely secondary to CKD stage V. -Ordered 2 unit of PRBC to keep hemoglobin above 8 for recent ACS. -Anemia panel -Continue to monitor -Transfuse if below 8   Subjective: Lee Bryant was seen and examined today.  Denies any chest pain today.  No obvious bleeding.  Did not had any bowel movement since in the hospital.  Waiting for cardiac cath.  Agreed to proceed with blood transfusion.  Physical Exam: Vitals:   08/07/22 1330 08/07/22 1345 08/07/22 1400 08/07/22 1430  BP: (!) 160/82 (!) 144/82 (!) 148/78 (!) 154/112  Pulse: 79 76 72 87  Resp: 10 15 11 13   Temp:      TempSrc:      SpO2: 96% 95% 95% 95%  Weight:      Height:       General.  Obese gentleman, in no acute distress. Pulmonary.  Lungs clear bilaterally, normal respiratory effort. CV.  Regular rate and rhythm, no JVD, rub or murmur. Abdomen.  Soft, nontender, nondistended, BS positive. CNS.  Alert and oriented .  No focal neurologic deficit. Extremities.  No edema, no cyanosis, pulses intact and symmetrical. Psychiatry.  Judgment and insight appears normal.   Data Reviewed: Prior data reviewed  Family Communication: Tried calling spouse and daughter with no response.  No voicemail left due to generic message.  Disposition: Status is: Inpatient Remains inpatient appropriate because: Severity of illness   Planned Discharge Destination: Home  DVT prophylaxis.  Heparin GTT Time spent: 50 minutes  This record has been created using Actor. Errors have been sought and corrected,but may not always be located. Such creation errors do not reflect on the standard of care.  Author: Lorella Nimrod, MD 08/07/2022 2:48 PM  For on call review [email or web address].

## 2022-08-07 NOTE — Progress Notes (Signed)
ANTICOAGULATION CONSULT NOTE  Pharmacy Consult for Heparin drip  Indication: chest pain/ACS  Allergies  Allergen Reactions   Cephalexin Rash    Patient Measurements: Height: 6\' 1"  (185.4 cm) Weight: 125.2 kg (276 lb) IBW/kg (Calculated) : 79.9 Heparin Dosing Weight: 107.5 kg  Vital Signs: Temp: 98 F (36.7 C) (10/19 2300) Temp Source: Oral (10/19 2300) BP: 115/58 (10/20 0000) Pulse Rate: 65 (10/20 0000)  Labs: Recent Labs    08/05/22 1545 08/05/22 1743 08/05/22 1745 08/05/22 2209 08/06/22 0009 08/06/22 0303 08/06/22 1510 08/07/22 0053  HGB 8.3*  --   --   --   --  7.1*  --   --   HCT 26.9*  --   --   --   --  23.2*  --   --   PLT 238  --   --   --   --  216  --   --   APTT  --  37*  --   --   --   --   --   --   LABPROT  --  14.6  --   --   --   --   --   --   INR  --  1.2  --   --   --   --   --   --   HEPARINUNFRC  --   --   --   --   --  <0.10* 0.23* 0.29*  CREATININE 5.02*  --   --   --   --  4.95*  --   --   TROPONINIHS 459*  --  383* 391* 337*  --   --   --      Estimated Creatinine Clearance: 20.1 mL/min (A) (by C-G formula based on SCr of 4.95 mg/dL (H)).   Medical History: Past Medical History:  Diagnosis Date   CAD (coronary artery disease)    had abnormal nuclear stress test 2021 or 2022   Cellulitis    CKD (chronic kidney disease), stage V (HCC)    Decubitus ulcer of sacral region, stage 4 ([place])    resolved - required multiple surgical interventions   Diabetes mellitus ([place])    type II   HLD (hyperlipidemia)    Hypertension    Obesity (BMI 30-39.9)     Medications:  (Not in a hospital admission) Scheduled:   amLODipine  7.5 mg Oral Daily   aspirin EC  81 mg Oral Daily   carvedilol  3.125 mg Oral BID WC   cloNIDine  0.3 mg Oral BID   doxazosin  4 mg Oral QHS   DULoxetine  30 mg Oral Daily   fenofibrate  54 mg Oral Daily   ferrous gluconate  324 mg Oral Q breakfast   gabapentin  600 mg Oral TID   hydrALAZINE  100 mg Oral TID    insulin aspart  0-20 Units Subcutaneous TID WC   insulin glargine-yfgn  10 Units Subcutaneous QHS   isosorbide mononitrate  60 mg Oral Daily   losartan  100 mg Oral Daily   rOPINIRole  0.5 mg Oral QHS   rosuvastatin  40 mg Oral Daily   sodium chloride flush  3 mL Intravenous Q12H   Infusions:   sodium chloride 10 mL/hr at 08/05/22 2213   heparin 1,900 Units/hr (08/06/22 1735)    Assessment: 66 yo M with PMH HTN, HLD, obesity, T2DM, CKD (BL Scr 4), anemia of CKD, CAD presents with sudden onset chest pain  associated with SOB, jaw pain, and headache. Troponin peak at 459. Pt not on chronic anticoagulation prior to admission. Hgb trending down, from 8.3 >> 7.1. Baseline Hgb appears to be 9 to 10.  Date Time aPTT/HL Rate/Comment 10/19 1510 0.23  Subtherapeutic; 1700>>1900 10/20 0053 0.29  subtherapeutic    Baseline Labs: aPTT - 37; INR - 1.2 Hgb - 8.3; Plts - 238  Goal of Therapy:  Heparin level 0.3-0.7 units/ml Monitor platelets by anticoagulation protocol: Yes   Plan: Bolus heparin 1600 units x 1 Increase heparin infusion rate to 2050 units/hr Recheck HL in 8 hr after rate change Monitor CBC daily while on heparin  Renda Rolls, PharmD, [facility] 08/07/2022 2:18 AM

## 2022-08-07 NOTE — Op Note (Signed)
  OPERATIVE NOTE   PROCEDURE: Ultrasound guidance for vascular access right femoral vein Placement of a 30 cm triple lumen dialysis catheter right femoral vein  PRE-OPERATIVE DIAGNOSIS: 1. Acute renal failure   POST-OPERATIVE DIAGNOSIS: Same  SURGEON: Leotis Pain, MD  ASSISTANT(S): None  ANESTHESIA: local  ESTIMATED BLOOD LOSS: Minimal   FINDING(S): 1.  None  SPECIMEN(S):  None  INDICATIONS:    Patient is a 66 y.o.male who presents with renal failure after cardiac catheterization today.  Risks and benefits were discussed, and informed consent was obtained..  DESCRIPTION: After obtaining full informed written consent, the patient was laid flat in the bed.  The right groin was sterilely prepped and draped in a sterile surgical field was created. The right femoral vein was visualized with ultrasound and found to be widely patent. It was then accessed under direct guidance without difficulty with a Seldinger needle and a permanent image was recorded. A J-wire was then placed. After skin nick and dilatation, a 30 cm triple lumen dialysis catheter was placed over the wire and the wire was removed. The lumens withdrew dark red nonpulsatile blood and flushed easily with sterile saline. The catheter was secured to the skin with 3 nylon sutures. Sterile dressing was placed.  COMPLICATIONS: None  CONDITION: Stable  Leotis Pain 08/07/2022 3:48 PM  This note was created with Dragon Medical transcription system. Any errors in dictation are purely unintentional.

## 2022-08-07 NOTE — Progress Notes (Signed)
Pre hd rn assessment 

## 2022-08-07 NOTE — Assessment & Plan Note (Signed)
Patient with multiple risk factors: DM, HTN, Obesity, CKD V with chronic anemia. Patient reports he had a nuclear stress test 1-2 years ago revealing CAD which has been managed medically. Over the last month he has had progressive DOE and reports 2-3 episodes of exertional chest pain that resolved with rest.  On the morning of admission he had another episode of crushing chest pain with radiation to the jaw and neck with acute dyspnea that was more severe than previous episodes consistent with unstable angina. He went to his PCP who referred him to [facility] for evaluation. EKG w/o acute STEMI; troponin #1 459, #2 383.   Cardiology was consulted-concern of angina and ACS. Patient underwent cardiac catheterization which shows multivessel disease, no intervention was done today, cardiology is recommending medical management and reattempt for stent placement at a later time. -Continue with heparin infusion-cardiology will determine the duration. -Continue with aspirin, Imdur, and statin

## 2022-08-07 NOTE — Assessment & Plan Note (Signed)
Hemoglobin dropped to 6.9 this morning.  No obvious bleeding but patient is on heparin infusion.  Most likely secondary to CKD stage V. -Ordered 2 unit of PRBC to keep hemoglobin above 8 for recent ACS. -Anemia panel -Continue to monitor -Transfuse if below 8

## 2022-08-07 NOTE — Assessment & Plan Note (Addendum)
Significant improvement in BUN and creatinine after getting 1 session of dialysis yesterday.  Going for second session today.  Most likely ESRD now. -Nephrology is following

## 2022-08-07 NOTE — Progress Notes (Signed)
Pt completed first HD treatment (2 hours) w/ no complications. Report to primary RN Start: 0539 End: 2032 No fluid removed 24L BVP 1 uprbc transfused w/ HD No HD meds ordered 120.2kg post hd bed weight

## 2022-08-07 NOTE — Consult Note (Signed)
[facility] Vascular Consult Note  MRN : [id number]  Lee Elamin. is a 66 y.o. ([birth year]) male who presents with chief complaint of  Chief Complaint  Patient presents with   Chest Pain    Pt. To ED via POV for sudden onset CP this AM with SOB, left jaw pain, and headache with fatigue following. Pt. States he has episodes intermittently. Pt's cardiologist states he has a blockage.   Marland Kitchen  History of Present Illness: Patient is admitted to the hospital with chest pain and shortness of breath and has developed acute renal failure after his cardiac catheterization today.  The patient reports continued shortness of breath even at rest.  The patient is critically ill with underlying chronic kidney disease and multiple other medical issues and now needs dialysis. The nephrology service has decided to initiate dialysis at this time, and we are asked to place a temporary dialysis catheter for immediate dialysis use.    Current Facility-Administered Medications  Medication Dose Route Frequency Provider Last Rate Last Admin   [MAR Hold] 0.9 %  sodium chloride infusion (Manually program via Guardrails IV Fluids)   Intravenous Once Lorella Nimrod, MD   Held at 08/07/22 0925   0.9 %  sodium chloride infusion   Intravenous Continuous Norins, Heinz Knuckles, MD   Stopped at 08/05/22 2253   0.9 %  sodium chloride infusion  250 mL Intravenous PRN White, Delicia, NP       [START ON 08/08/2022] 0.9 %  sodium chloride infusion   Intravenous Continuous White, Delicia, NP       0.9 %  sodium chloride infusion  250 mL Intravenous PRN Callwood, Dwayne D, MD       [MAR Hold] acetaminophen (TYLENOL) tablet 650 mg  650 mg Oral Q4H PRN Norins, Heinz Knuckles, MD       acetaminophen (TYLENOL) tablet 650 mg  650 mg Oral Q4H PRN Callwood, Dwayne D, MD       [MAR Hold] amLODipine (NORVASC) tablet 7.5 mg  7.5 mg Oral Daily Norins, Heinz Knuckles, MD   7.5 mg at 08/07/22 0920   [MAR Hold] aspirin EC tablet 81  mg  81 mg Oral Daily Norins, Heinz Knuckles, MD   81 mg at 08/07/22 0921   [MAR Hold] carvedilol (COREG) tablet 3.125 mg  3.125 mg Oral BID WC Norins, Heinz Knuckles, MD   3.125 mg at 08/07/22 0919   [START ON 08/08/2022] Chlorhexidine Gluconate Cloth 2 % PADS 6 each  6 each Topical Q0600 Colon Flattery, NP       [MAR Hold] cloNIDine (CATAPRES) tablet 0.3 mg  0.3 mg Oral BID Norins, Heinz Knuckles, MD   0.3 mg at 08/07/22 0920   [MAR Hold] cyclobenzaprine (FLEXERIL) tablet 10 mg  10 mg Oral BID PRN Norins, Heinz Knuckles, MD       [MAR Hold] doxazosin (CARDURA) tablet 4 mg  4 mg Oral QHS Norins, Heinz Knuckles, MD   4 mg at 08/06/22 2240   [MAR Hold] DULoxetine (CYMBALTA) DR capsule 30 mg  30 mg Oral Daily Norins, Heinz Knuckles, MD   30 mg at 08/07/22 0919   [MAR Hold] fenofibrate tablet 54 mg  54 mg Oral Daily Norins, Heinz Knuckles, MD   54 mg at 08/07/22 0919   fentaNYL (SUBLIMAZE) injection    PRN Callwood, Dwayne D, MD   25 mcg at 08/07/22 1205   [MAR Hold] ferrous gluconate (FERGON) tablet 324 mg  324 mg Oral Q breakfast  Neena Rhymes, MD   324 mg at 08/07/22 0919   [MAR Hold] gabapentin (NEURONTIN) capsule 600 mg  600 mg Oral TID Neena Rhymes, MD   600 mg at 08/07/22 4481   Heparin (Porcine) in NaCl 1000-0.9 UT/500ML-% SOLN    PRN Callwood, Dwayne D, MD   1,000 mL at 08/07/22 1206   heparin sodium (porcine) injection    PRN Callwood, Dwayne D, MD   6,000 Units at 08/07/22 1223   hydrALAZINE (APRESOLINE) injection 10 mg  10 mg Intravenous Q20 Min PRN Callwood, Loran Senters, MD       [MAR Hold] hydrALAZINE (APRESOLINE) tablet 100 mg  100 mg Oral TID Neena Rhymes, MD   100 mg at 08/07/22 0921   [MAR Hold] HYDROcodone-acetaminophen (NORCO/VICODIN) 5-325 MG per tablet 1 tablet  1 tablet Oral Q6H PRN Norins, Heinz Knuckles, MD       [facility] Hold] insulin aspart (novoLOG) injection 0-20 Units  0-20 Units Subcutaneous TID WC Norins, Heinz Knuckles, MD   4 Units at 08/06/22 1823   [MAR Hold] insulin glargine-yfgn (SEMGLEE) injection  10 Units  10 Units Subcutaneous QHS Norins, Heinz Knuckles, MD   10 Units at 08/06/22 0016   iohexol (OMNIPAQUE) 300 MG/ML solution    PRN Callwood, Dwayne D, MD   280 mL at 08/07/22 1254   [MAR Hold] isosorbide mononitrate (IMDUR) 24 hr tablet 60 mg  60 mg Oral Daily Tristan Schroeder, PA-C   60 mg at 08/07/22 0919   labetalol (NORMODYNE) injection 10 mg  10 mg Intravenous Q10 min PRN Callwood, Dwayne D, MD       lidocaine (PF) (XYLOCAINE) 1 % injection    PRN Callwood, Dwayne D, MD   5 mL at 08/07/22 1217   [MAR Hold] losartan (COZAAR) tablet 100 mg  100 mg Oral Daily Norins, Heinz Knuckles, MD   100 mg at 08/07/22 0921   midazolam (VERSED) injection    PRN Lujean Amel D, MD   1 mg at 08/07/22 1205   [MAR Hold] nitroGLYCERIN (NITROSTAT) SL tablet 0.4 mg  0.4 mg Sublingual Q5 Min x 3 PRN Norins, Heinz Knuckles, MD       [MAR Hold] ondansetron [facility]) injection 4 mg  4 mg Intravenous Q6H PRN Norins, Heinz Knuckles, MD       ondansetron [facility]) injection 4 mg  4 mg Intravenous Q6H PRN Callwood, Dwayne D, MD       [MAR Hold] rOPINIRole (REQUIP) tablet 0.5 mg  0.5 mg Oral QHS Norins, Heinz Knuckles, MD   0.5 mg at 08/06/22 2240   [MAR Hold] rosuvastatin (CRESTOR) tablet 40 mg  40 mg Oral Daily Norins, Heinz Knuckles, MD   40 mg at 08/07/22 0920   [MAR Hold] sodium chloride flush (NS) 0.9 % injection 3 mL  3 mL Intravenous E56D White, Delicia, NP       sodium chloride flush (NS) 0.9 % injection 3 mL  3 mL Intravenous PRN White, Delicia, NP       sodium chloride flush (NS) 0.9 % injection 3 mL  3 mL Intravenous Q12H Callwood, Dwayne D, MD       sodium chloride flush (NS) 0.9 % injection 3 mL  3 mL Intravenous PRN Callwood, Dwayne D, MD       verapamil (ISOPTIN) injection    PRN Callwood, Dwayne D, MD   2.5 mg at 08/07/22 1219    Past Medical History:  Diagnosis Date   CAD (coronary artery disease)  had abnormal nuclear stress test 2021 or 2022   Cellulitis    CKD (chronic kidney disease), stage V (HCC)    Decubitus  ulcer of sacral region, stage 4 ([place])    resolved - required multiple surgical interventions   Diabetes mellitus ([place])    type II   HLD (hyperlipidemia)    Hypertension    Obesity (BMI 30-39.9)     Past Surgical History:  Procedure Laterality Date   ducubitus ulcer debridement N/A      Social History   Tobacco Use   Smoking status: Former    Types: Cigarettes   Smokeless tobacco: Never   Tobacco comments:    when he was 66yrs old.  Vaping Use   Vaping Use: Never used  Substance Use Topics   Alcohol use: Yes    Comment: occassionally.   Drug use: Yes    Types: Marijuana     Family History  Problem Relation Age of Onset   Cancer Mother    Hypertension Mother    Heart attack Father    Cancer Father    Stroke Sister    Diabetes Brother    Kidney failure Brother     Allergies  Allergen Reactions   Cephalexin Rash     REVIEW OF SYSTEMS (Negative unless checked)  Constitutional: [] Weight loss  [] Fever  [] Chills Cardiac: [x] Chest pain   [] Chest pressure   [] Palpitations   [] Shortness of breath when laying flat   [x] Shortness of breath at rest   [x] Shortness of breath with exertion. Vascular:  [] Pain in legs with walking   [] Pain in legs at rest   [] Pain in legs when laying flat   [] Claudication   [] Pain in feet when walking  [] Pain in feet at rest  [] Pain in feet when laying flat   [] History of DVT   [] Phlebitis   [] Swelling in legs   [] Varicose veins   [x] Non-healing ulcers Pulmonary:   [] Uses home oxygen   [] Productive cough   [] Hemoptysis   [] Wheeze  [] COPD   [] Asthma Neurologic:  [] Dizziness  [] Blackouts   [] Seizures   [] History of stroke   [] History of TIA  [] Aphasia   [] Temporary blindness   [] Dysphagia   [] Weakness or numbness in arms   [] Weakness or numbness in legs Musculoskeletal:  [] Arthritis   [] Joint swelling   [] Joint pain   [] Low back pain Hematologic:  [] Easy bruising  [] Easy bleeding   [] Hypercoagulable state   [x] Anemic   [] Hepatitis Gastrointestinal:  [] Blood in stool   [] Vomiting blood  [] Gastroesophageal reflux/heartburn   [] Difficulty swallowing. Genitourinary:  [x] Chronic kidney disease   [] Difficult urination  [] Frequent urination  [] Burning with urination   [] Blood in urine Skin:  [] Rashes   [x] Ulcers   [x] Wounds Psychological:  [] History of anxiety   []  History of major depression.       Physical Examination  Vitals:   08/07/22 1430 08/07/22 1500 08/07/22 1515 08/07/22 1545  BP: (!) 154/112 (!) 174/72 (!) 163/75 (!) 164/77  Pulse: 87 81 87   Resp: 13 13 13    Temp:      TempSrc:      SpO2: 95% 95% 96%   Weight:      Height:       Body mass index is 36.41 kg/m. Gen: WD/WN.  Obese Head: [place]/AT, No temporalis wasting Ear/Nose/Throat: Hearing grossly intact, nares w/o erythema or drainage Eyes: Sclera non-icteric, conjunctiva clear Neck: Supple, no nuchal rigidity.  No JVD.  Pulmonary:  Good air movement,  clear to auscultation bilaterally.  Cardiac: RRR, no JVD Gastrointestinal: soft, non-tender/non-distended. No guarding/reflex.  Musculoskeletal: M/S 5/5 throughout.  Extremities without ischemic changes.  No deformity or atrophy. Mild LE Edema in the lower extremities bilaterally Neurologic: A and Q x 3, non focal Psychiatric: normal affect and mood       CBC Lab Results  Component Value Date   WBC 7.1 08/07/2022   HGB 6.9 (L) 08/07/2022   HCT 23.1 (L) 08/07/2022   MCV 88.5 08/07/2022   PLT 203 08/07/2022    BMET    Component Value Date/Time   NA 136 08/07/2022 0604   K 4.4 08/07/2022 0604   CL 109 08/07/2022 0604   CO2 23 08/07/2022 0604   GLUCOSE 113 (H) 08/07/2022 0604   BUN 67 (H) 08/07/2022 0604   CREATININE 4.89 (H) 08/07/2022 0604   CALCIUM 8.7 (L) 08/07/2022 0604   GFRNONAA 12 (L) 08/07/2022 0604   GFRAA >60 03/13/2017 0332   Estimated Creatinine Clearance: 20.3 mL/min (A) (by C-G formula based on SCr of 4.89 mg/dL (H)).  COAG Lab Results  Component  Value Date   INR 1.2 08/05/2022    Radiology CARDIAC CATHETERIZATION  Result Date: 08/07/2022   Ost RCA to Prox RCA lesion is 100% stenosed.   Mid Cx lesion is 90% stenosed.   Dist Cx lesion is 75% stenosed.   Ramus lesion is 50% stenosed.   Mid LAD lesion is 50% stenosed.   Dist RCA lesion is 100% stenosed.   There is moderate left ventricular systolic dysfunction.   LV end diastolic pressure is mildly elevated.   The left ventricular ejection fraction is 35-45% by visual estimate. Conclusion Successful left heart cath right radial approach Mild to moderate depressed left ventricular function inferior hypokinesis ejection fraction of around 40% with left ventricular enlargement Coronaries Left main large minor irregularities LAD large 50% mid otherwise minor irregularities Circumflex large 90% mid 75% distal Ramus large diffuse 50% Mixed dominant system RCA large 100% occluded proximally TIMI 0 flow CTO moderate to severe calcification Intervention deferred Recommend medical therapy Recommend dialysis today Contrast load was high at 280 cc because of difficulty with the case Consider intervention of circumflex in the future RCA is not amenable to percutaneous intervention  ECHOCARDIOGRAM COMPLETE  Result Date: 08/06/2022    ECHOCARDIOGRAM REPORT   Patient Name:   Lee Bryant. Date of Exam: 08/06/2022 Medical Rec #:  [id number]             Height:       73.0 in Accession #:    [id number]            Weight:       276.0 lb Date of Birth:  October 27, 1955            BSA:          2.467 m Patient Age:    43 years              BP:           147/65 mmHg Patient Gender: M                     HR:           85 bpm. Exam Location:  [facility] Procedure: 2D Echo, Color Doppler, Cardiac Doppler and Intracardiac            Opacification Agent Indications:     I21.4 NSTEMI  History:  Patient has prior history of Echocardiogram examinations, most                  recent 03/14/2017. CAD, CKD; Risk  Factors:Hypertension, Diabetes                  and Dyslipidemia.  Sonographer:     Charmayne Sheer Referring Phys:  [place] Diagnosing Phys: Yolonda Kida MD  Sonographer Comments: Image acquisition challenging due to patient body habitus. IMPRESSIONS  1. Left ventricular ejection fraction, by estimation, is 50 to 55%. The left ventricle has low normal function. The left ventricle demonstrates global hypokinesis. The left ventricular internal cavity size was moderately to severely dilated. There is moderate concentric left ventricular hypertrophy. Left ventricular diastolic parameters are consistent with Grade II diastolic dysfunction (pseudonormalization).  2. Right ventricular systolic function is normal. The right ventricular size is normal.  3. Left atrial size was moderately dilated.  4. Right atrial size was mild to moderately dilated.  5. The mitral valve is grossly normal. Mild mitral valve regurgitation.  6. The aortic valve is calcified. Aortic valve regurgitation is trivial. Aortic valve sclerosis/calcification is present, without any evidence of aortic stenosis.  7. Aortic dilatation noted. There is moderate dilatation of the aortic root. FINDINGS  Left Ventricle: Left ventricular ejection fraction, by estimation, is 50 to 55%. The left ventricle has low normal function. The left ventricle demonstrates global hypokinesis. Definity contrast agent was given IV to delineate the left ventricular endocardial borders. The left ventricular internal cavity size was moderately to severely dilated. There is moderate concentric left ventricular hypertrophy. Left ventricular diastolic parameters are consistent with Grade II diastolic dysfunction (pseudonormalization). Right Ventricle: The right ventricular size is normal. No increase in right ventricular wall thickness. Right ventricular systolic function is normal. Left Atrium: Left atrial size was moderately dilated. Right Atrium: Right atrial size  was mild to moderately dilated. Pericardium: There is no evidence of pericardial effusion. Mitral Valve: The mitral valve is grossly normal. Mild mitral valve regurgitation. Tricuspid Valve: The tricuspid valve is normal in structure. Tricuspid valve regurgitation is mild. Aortic Valve: The aortic valve is calcified. Aortic valve regurgitation is trivial. Aortic valve sclerosis/calcification is present, without any evidence of aortic stenosis. Aortic valve mean gradient measures 7.0 mmHg. Aortic valve peak gradient measures 13.5 mmHg. Aortic valve area, by VTI measures 2.69 cm. Pulmonic Valve: The pulmonic valve was grossly normal. Pulmonic valve regurgitation is not visualized. Aorta: The ascending aorta was not well visualized and aortic dilatation noted. There is moderate dilatation of the aortic root. IAS/Shunts: No atrial level shunt detected by color flow Doppler.  LEFT VENTRICLE PLAX 2D LVIDd:         5.60 cm      Diastology LVIDs:         4.20 cm      LV e' medial:    6.09 cm/s LV PW:         1.70 cm      LV E/e' medial:  22.8 LV IVS:        1.50 cm      LV e' lateral:   12.10 cm/s LVOT diam:     2.30 cm      LV E/e' lateral: 11.5 LV SV:         81 LV SV Index:   33 LVOT Area:     4.15 cm  LV Volumes (MOD) LV vol d, MOD A2C: 166.0 ml LV vol d,  MOD A4C: 218.0 ml LV vol s, MOD A2C: 63.5 ml LV vol s, MOD A4C: 80.1 ml LV SV MOD A2C:     102.5 ml LV SV MOD A4C:     218.0 ml LV SV MOD BP:      115.0 ml RIGHT VENTRICLE RV Basal diam:  4.30 cm RV S prime:     19.10 cm/s LEFT ATRIUM              Index        RIGHT ATRIUM           Index LA diam:        5.20 cm  2.11 cm/m   RA Area:     14.70 cm LA Vol (A2C):   103.0 ml 41.75 ml/m  RA Volume:   33.70 ml  13.66 ml/m LA Vol (A4C):   84.3 ml  34.17 ml/m LA Biplane Vol: 95.8 ml  38.83 ml/m  AORTIC VALVE                     PULMONIC VALVE AV Area (Vmax):    2.53 cm      PV Vmax:       1.25 m/s AV Area (Vmean):   2.49 cm      PV Vmean:      85.200 cm/s AV Area  (VTI):     2.69 cm      PV VTI:        0.246 m AV Vmax:           184.00 cm/s   PV Peak grad:  6.2 mmHg AV Vmean:          125.000 cm/s  PV Mean grad:  3.0 mmHg AV VTI:            0.301 m AV Peak Grad:      13.5 mmHg AV Mean Grad:      7.0 mmHg LVOT Vmax:         112.00 cm/s LVOT Vmean:        74.800 cm/s LVOT VTI:          0.195 m LVOT/AV VTI ratio: 0.65  AORTA Ao Root diam: 4.60 cm MITRAL VALVE MV Area (PHT): 4.49 cm     SHUNTS MV Decel Time: 169 msec     Systemic VTI:  0.20 m MV E velocity: 139.00 cm/s  Systemic Diam: 2.30 cm MV A velocity: 88.90 cm/s MV E/A ratio:  1.56 Dwayne Prince [place] MD Electronically signed by Yolonda Kida MD Signature Date/Time: 08/06/2022/5:14:00 PM    Final    DG Chest 2 View  Result Date: 08/05/2022 CLINICAL DATA:  Chest pain EXAM: CHEST - 2 VIEW COMPARISON:  None Available. FINDINGS: Mild cardiac enlargement with normal pulmonary vascularity. Lungs clear without infiltrate or effusion Advanced degenerative change in both shoulders. IMPRESSION: No active cardiopulmonary disease. Electronically Signed   By: Franchot Gallo M.D.   On: 08/05/2022 16:23      Assessment/Plan 1. ARF on top of severe chronic kidney disease We will proceed with temporary dialysis catheter placement at this time.  Risks and benefits discussed with patient and/or family, and the catheter will be placed to allow immediate initiation of dialysis.  If the patient's renal function does not improve throughout the hospital course, we will be happy to place a tunneled dialysis catheter for long term use prior to discharge.   2.  Diabetes.  Likely an underlying cause of his  renal dysfunction blood glucose control important in reducing the progression of atherosclerotic disease. Also, involved in wound healing. On appropriate medications. 3.  Hypertension. Likely an underlying cause of his renal dysfunction and blood pressure control important in reducing the progression of atherosclerotic disease. On  appropriate oral medications.    Leotis Pain, MD  08/07/2022 3:50 PM

## 2022-08-07 NOTE — Progress Notes (Signed)
Post hd rn assessment 

## 2022-08-07 NOTE — Progress Notes (Signed)
Nephrology continuing to follow per phone call to dialysis. Patient has not began dialysis process yet.

## 2022-08-07 NOTE — Progress Notes (Signed)
[facility] Cardiology    SUBJECTIVE: Denies any chest pain status post cardiac cath resting comfortably preop for dialysis hopefully later today   Vitals:   08/07/22 0847 08/07/22 1042 08/07/22 1308 08/07/22 1315  BP:  (!) 140/69 (!) 154/80 (!) 152/75  Pulse:  76 84 79  Resp:  16 12 (!) 8  Temp: 98.1 F (36.7 C) 98.1 F (36.7 C)    TempSrc: Oral Oral    SpO2:  95% 95% 94%  Weight:      Height:         Intake/Output Summary (Last 24 hours) at 08/07/2022 1332 Last data filed at 08/07/2022 0907 Gross per 24 hour  Intake 4.57 ml  Output --  Net 4.57 ml      PHYSICAL EXAM  General: Well developed, well nourished, in no acute distress HEENT:  Normocephalic and atramatic Neck:  No JVD.  Lungs: Clear bilaterally to auscultation and percussion. Heart: HRRR . Normal S1 and S2 without gallops or murmurs.  Abdomen: Bowel sounds are positive, abdomen soft and non-tender  Msk:  Back normal, normal gait. Normal strength and tone for age. Extremities: No clubbing, cyanosis or edema.   Neuro: Alert and oriented X 3. Psych:  Good affect, responds appropriately   LABS: Basic Metabolic Panel: Recent Labs    08/06/22 0303 08/07/22 0604  NA 137 136  K 4.1 4.4  CL 110 109  CO2 19* 23  GLUCOSE 165* 113*  BUN 69* 67*  CREATININE 4.95* 4.89*  CALCIUM 8.9 8.7*   Liver Function Tests: No results for input(s): "AST", "ALT", "ALKPHOS", "BILITOT", "PROT", "ALBUMIN" in the last 72 hours. No results for input(s): "LIPASE", "AMYLASE" in the last 72 hours. CBC: Recent Labs    08/06/22 0303 08/07/22 0604  WBC 7.1 7.1  HGB 7.1* 6.9*  HCT 23.2* 23.1*  MCV 86.9 88.5  PLT 216 203   Cardiac Enzymes: No results for input(s): "CKTOTAL", "CKMB", "CKMBINDEX", "TROPONINI" in the last 72 hours. BNP: Invalid input(s): "POCBNP" D-Dimer: No results for input(s): "DDIMER" in the last 72 hours. Hemoglobin A1C: Recent Labs    08/05/22 1547  HGBA1C 5.5   Fasting Lipid Panel: Recent Labs     08/06/22 0303  CHOL 118  HDL 25*  LDLCALC 59  TRIG 171*  CHOLHDL 4.7   Thyroid Function Tests: No results for input(s): "TSH", "T4TOTAL", "T3FREE", "THYROIDAB" in the last 72 hours.  Invalid input(s): "FREET3" Anemia Panel: Recent Labs    08/07/22 0918  RETICCTPCT 1.8    CARDIAC CATHETERIZATION  Result Date: 08/07/2022   Ost RCA to Prox RCA lesion is 100% stenosed.   Mid Cx lesion is 90% stenosed.   Dist Cx lesion is 75% stenosed.   Ramus lesion is 50% stenosed.   Mid LAD lesion is 50% stenosed.   Dist RCA lesion is 100% stenosed.   There is moderate left ventricular systolic dysfunction.   LV end diastolic pressure is mildly elevated.   The left ventricular ejection fraction is 35-45% by visual estimate. Conclusion Successful left heart cath right radial approach Mild to moderate depressed left ventricular function inferior hypokinesis ejection fraction of around 40% with left ventricular enlargement Coronaries Left main large minor irregularities LAD large 50% mid otherwise minor irregularities Circumflex large 90% mid 75% distal Ramus large diffuse 50% Mixed dominant system RCA large 100% occluded proximally TIMI 0 flow CTO moderate to severe calcification Intervention deferred Recommend medical therapy Recommend dialysis today Contrast load was high at 280 cc because of difficulty  with the case Consider intervention of circumflex in the future RCA is not amenable to percutaneous intervention  ECHOCARDIOGRAM COMPLETE  Result Date: 08/06/2022    ECHOCARDIOGRAM REPORT   Patient Name:   Lee Bryant. Date of Exam: 08/06/2022 Medical Rec #:  [id number]             Height:       73.0 in Accession #:    [id number]            Weight:       276.0 lb Date of Birth:  07-29-1956            BSA:          2.467 m Patient Age:    66 years              BP:           147/65 mmHg Patient Gender: M                     HR:           85 bpm. Exam Location:  [facility] Procedure: 2D Echo, Color Doppler,  Cardiac Doppler and Intracardiac            Opacification Agent Indications:     I21.4 NSTEMI  History:         Patient has prior history of Echocardiogram examinations, most                  recent 03/14/2017. CAD, CKD; Risk Factors:Hypertension, Diabetes                  and Dyslipidemia.  Sonographer:     Charmayne Sheer Referring Phys:  [place] Diagnosing Phys: Yolonda Kida MD  Sonographer Comments: Image acquisition challenging due to patient body habitus. IMPRESSIONS  1. Left ventricular ejection fraction, by estimation, is 50 to 55%. The left ventricle has low normal function. The left ventricle demonstrates global hypokinesis. The left ventricular internal cavity size was moderately to severely dilated. There is moderate concentric left ventricular hypertrophy. Left ventricular diastolic parameters are consistent with Grade II diastolic dysfunction (pseudonormalization).  2. Right ventricular systolic function is normal. The right ventricular size is normal.  3. Left atrial size was moderately dilated.  4. Right atrial size was mild to moderately dilated.  5. The mitral valve is grossly normal. Mild mitral valve regurgitation.  6. The aortic valve is calcified. Aortic valve regurgitation is trivial. Aortic valve sclerosis/calcification is present, without any evidence of aortic stenosis.  7. Aortic dilatation noted. There is moderate dilatation of the aortic root. FINDINGS  Left Ventricle: Left ventricular ejection fraction, by estimation, is 50 to 55%. The left ventricle has low normal function. The left ventricle demonstrates global hypokinesis. Definity contrast agent was given IV to delineate the left ventricular endocardial borders. The left ventricular internal cavity size was moderately to severely dilated. There is moderate concentric left ventricular hypertrophy. Left ventricular diastolic parameters are consistent with Grade II diastolic dysfunction (pseudonormalization). Right  Ventricle: The right ventricular size is normal. No increase in right ventricular wall thickness. Right ventricular systolic function is normal. Left Atrium: Left atrial size was moderately dilated. Right Atrium: Right atrial size was mild to moderately dilated. Pericardium: There is no evidence of pericardial effusion. Mitral Valve: The mitral valve is grossly normal. Mild mitral valve regurgitation. Tricuspid Valve: The tricuspid valve is normal in structure. Tricuspid valve regurgitation is mild. Aortic  Valve: The aortic valve is calcified. Aortic valve regurgitation is trivial. Aortic valve sclerosis/calcification is present, without any evidence of aortic stenosis. Aortic valve mean gradient measures 7.0 mmHg. Aortic valve peak gradient measures 13.5 mmHg. Aortic valve area, by VTI measures 2.69 cm. Pulmonic Valve: The pulmonic valve was grossly normal. Pulmonic valve regurgitation is not visualized. Aorta: The ascending aorta was not well visualized and aortic dilatation noted. There is moderate dilatation of the aortic root. IAS/Shunts: No atrial level shunt detected by color flow Doppler.  LEFT VENTRICLE PLAX 2D LVIDd:         5.60 cm      Diastology LVIDs:         4.20 cm      LV e' medial:    6.09 cm/s LV PW:         1.70 cm      LV E/e' medial:  22.8 LV IVS:        1.50 cm      LV e' lateral:   12.10 cm/s LVOT diam:     2.30 cm      LV E/e' lateral: 11.5 LV SV:         81 LV SV Index:   33 LVOT Area:     4.15 cm  LV Volumes (MOD) LV vol d, MOD A2C: 166.0 ml LV vol d, MOD A4C: 218.0 ml LV vol s, MOD A2C: 63.5 ml LV vol s, MOD A4C: 80.1 ml LV SV MOD A2C:     102.5 ml LV SV MOD A4C:     218.0 ml LV SV MOD BP:      115.0 ml RIGHT VENTRICLE RV Basal diam:  4.30 cm RV S prime:     19.10 cm/s LEFT ATRIUM              Index        RIGHT ATRIUM           Index LA diam:        5.20 cm  2.11 cm/m   RA Area:     14.70 cm LA Vol (A2C):   103.0 ml 41.75 ml/m  RA Volume:   33.70 ml  13.66 ml/m LA Vol (A4C):   84.3  ml  34.17 ml/m LA Biplane Vol: 95.8 ml  38.83 ml/m  AORTIC VALVE                     PULMONIC VALVE AV Area (Vmax):    2.53 cm      PV Vmax:       1.25 m/s AV Area (Vmean):   2.49 cm      PV Vmean:      85.200 cm/s AV Area (VTI):     2.69 cm      PV VTI:        0.246 m AV Vmax:           184.00 cm/s   PV Peak grad:  6.2 mmHg AV Vmean:          125.000 cm/s  PV Mean grad:  3.0 mmHg AV VTI:            0.301 m AV Peak Grad:      13.5 mmHg AV Mean Grad:      7.0 mmHg LVOT Vmax:         112.00 cm/s LVOT Vmean:        74.800 cm/s LVOT VTI:  0.195 m LVOT/AV VTI ratio: 0.65  AORTA Ao Root diam: 4.60 cm MITRAL VALVE MV Area (PHT): 4.49 cm     SHUNTS MV Decel Time: 169 msec     Systemic VTI:  0.20 m MV E velocity: 139.00 cm/s  Systemic Diam: 2.30 cm MV A velocity: 88.90 cm/s MV E/A ratio:  1.56 Yolonda Kida MD Electronically signed by Yolonda Kida MD Signature Date/Time: 08/06/2022/5:14:00 PM    Final    DG Chest 2 View  Result Date: 08/05/2022 CLINICAL DATA:  Chest pain EXAM: CHEST - 2 VIEW COMPARISON:  None Available. FINDINGS: Mild cardiac enlargement with normal pulmonary vascularity. Lungs clear without infiltrate or effusion Advanced degenerative change in both shoulders. IMPRESSION: No active cardiopulmonary disease. Electronically Signed   By: Franchot Gallo M.D.   On: 08/05/2022 16:23    ECHO : Mildly depressed left ventricular function EF around 50 to 55% on echo left ventricular enlargement    TELEMETRY: Normal sinus rhythm nonspecific ST-T changes rate of 60:  ASSESSMENT AND PLAN:  Principal Problem:   NSTEMI (non-ST elevated myocardial infarction) ([place]) Active Problems:   Obesity (BMI 30-39.9)   DM2 (diabetes mellitus, type 2) ([facility])   HTN (hypertension)   CKD (chronic kidney disease), stage V ([place])    Plan Status post cardiac cath multivessel coronary disease recommend medical therapy Mild cardiomyopathy continue blood pressure control heart failure  therapy Proceed with dialysis therapy for end-stage renal disease Recommend weight loss exercise portion control Continue diabetes management and control Recommend antianginal therapy with nitrates beta-blockers consider hydralazine consider ACE ARB or Arni if okay with nephrology Anemia chronic probably related to renal disease and chronic disease   Yolonda Kida, MD 08/07/2022 1:32 PM

## 2022-08-07 NOTE — Assessment & Plan Note (Signed)
Long standing problem. Currently  on ARB, CCB, peripheral dilator.  Plan Continue outpatient meds  Add beta-blocker

## 2022-08-07 NOTE — Progress Notes (Signed)
[facility]  ROUNDING NOTE   Subjective:   .Lee Bryant. Is a 66 y.o. male past medical conditions including diabetes, hypertension, hyperlipidemia, anemia, and chronic kidney disease stage V.  Patient presents to the emergency department with complaints of chest pain and has been admitted for NSTEMI (non-ST elevated myocardial infarction) [facility]) [I21.4]  Patient is known to our practice and receives outpatient nephrology follow-up with Dr. Candiss Norse.  Patient was last seen in office on July 01, 2022.  It was discussed at that time patient's advanced kidney disease, dialysis options and patient prefers home modalities.  Patient is seen and evaluated on stretcher in ED.  No family at bedside.  Currently receiving heparin drip.  Patient states he suffered a small heart attack yesterday.  Currently denies chest pain, shortness of breath, or discomfort.  Room air.  Patient does report 2-3 previous MIs within health history.  Continues to urinate.  Denies NSAID use.  Denies nausea, vomiting, or dehydration.  Labs on ED arrival significant for serum bicarb 21, 23, BUN 76, creatinine 5.02 with GFR 12, troponin 469, hemoglobin 8.3.  Chest x-ray shows no acute changes.  Troponins are trending down.  Renal function has progressively decreased over the past few months, baseline creatinine 4.8 with GFR 12 on May 12, 2022.  Cardiology consulted and considering left heart cath .  We have been consulted to evaluate worsening kidney disease.  Patient was seen today after catheterization I discussed with the patient about his kidney related/fluid overload/IV contrast issues After discussion patient was willing to start renal placement therapy   Objective:  Vital signs in last 24 hours:  Temp:  [98 F (36.7 C)-98.4 F (36.9 C)] 98.1 F (36.7 C) (10/20 1042) Pulse Rate:  [65-96] 80 (10/20 1600) Resp:  [8-24] 18 (10/20 1600) BP: (115-174)/(56-112) 161/74 (10/20 1600) SpO2:  [88 %-97 %]  94 % (10/20 1600)  Weight change:  Filed Weights   08/05/22 1543  Weight: 125.2 kg    Intake/Output: No intake/output data recorded.   Intake/Output this shift:  Total I/O In: 4.6 [I.V.:4.6] Out: -   Physical Exam: General: NAD  Head: Normocephalic, atraumatic. Moist oral mucosal membranes  Eyes: Anicteric  Lungs:  Clear to auscultation, normal effort  Heart: Regular rate and rhythm  Abdomen:  Soft, nontender, obese  Extremities: No peripheral edema.  Neurologic: Nonfocal, moving all four extremities  Skin: No lesions  Access: Addendum Patient has temporary catheter in situ    Basic Metabolic Panel: Recent Labs  Lab 08/05/22 1545 08/06/22 0303 08/07/22 0604  NA 135 137 136  K 4.3 4.1 4.4  CL 106 110 109  CO2 21* 19* 23  GLUCOSE 123* 165* 113*  BUN 76* 69* 67*  CREATININE 5.02* 4.95* 4.89*  CALCIUM 9.4 8.9 8.7*    Liver Function Tests: No results for input(s): "AST", "ALT", "ALKPHOS", "BILITOT", "PROT", "ALBUMIN" in the last 168 hours. No results for input(s): "LIPASE", "AMYLASE" in the last 168 hours. No results for input(s): "AMMONIA" in the last 168 hours.  CBC: Recent Labs  Lab 08/05/22 1545 08/06/22 0303 08/07/22 0604  WBC 7.6 7.1 7.1  HGB 8.3* 7.1* 6.9*  HCT 26.9* 23.2* 23.1*  MCV 87.6 86.9 88.5  PLT 238 216 203    Cardiac Enzymes: No results for input(s): "CKTOTAL", "CKMB", "CKMBINDEX", "TROPONINI" in the last 168 hours.  BNP: Invalid input(s): "POCBNP"  CBG: Recent Labs  Lab 08/06/22 0806 08/06/22 1313 08/06/22 1740 08/06/22 2236 08/07/22 0803  GLUCAP 118*  106* 67* 6 113*    Microbiology: Results for orders placed or performed during the hospital encounter of 03/12/17  MRSA PCR Screening     Status: None   Collection Time: 03/12/17  1:26 PM   Specimen: Nasal Mucosa; Nasopharyngeal  Result Value Ref Range Status   MRSA by PCR NEGATIVE NEGATIVE Final    Comment:        The GeneXpert MRSA Assay (FDA approved for NASAL  specimens only), is one component of a comprehensive MRSA colonization surveillance program. It is not intended to diagnose MRSA infection nor to guide or monitor treatment for MRSA infections.   Aerobic Culture  (superficial specimen)     Status: None   Collection Time: 03/12/17  6:00 PM   Specimen: Ankle; Wound  Result Value Ref Range Status   Specimen Description ANKLE RIGHT ANKLE AND BACK OF ANKLE ULCERS  Final   Special Requests Normal  Final   Gram Stain   Final    MODERATE WBC PRESENT, PREDOMINANTLY PMN ABUNDANT GRAM NEGATIVE RODS MODERATE GRAM POSITIVE COCCI IN PAIRS IN CLUSTERS FEW GRAM POSITIVE RODS    Culture   Final    ABUNDANT STAPHYLOCOCCUS AUREUS WITHIN MIXED CULTURE Performed at [facility], [place] [address] [place], [place] [address]    Report Status 03/15/2017 FINAL  Final   Organism ID, Bacteria STAPHYLOCOCCUS AUREUS  Final      Susceptibility   Staphylococcus aureus - MIC*    CIPROFLOXACIN >=8 RESISTANT Resistant     ERYTHROMYCIN >=8 RESISTANT Resistant     GENTAMICIN <=0.5 SENSITIVE Sensitive     OXACILLIN 0.5 SENSITIVE Sensitive     TETRACYCLINE <=1 SENSITIVE Sensitive     VANCOMYCIN 1 SENSITIVE Sensitive     TRIMETH/SULFA <=10 SENSITIVE Sensitive     CLINDAMYCIN <=0.25 SENSITIVE Sensitive     RIFAMPIN <=0.5 SENSITIVE Sensitive     Inducible Clindamycin NEGATIVE Sensitive     * ABUNDANT STAPHYLOCOCCUS AUREUS    Coagulation Studies: Recent Labs    08/05/22 1743  LABPROT 14.6  INR 1.2    Urinalysis: No results for input(s): "COLORURINE", "LABSPEC", "PHURINE", "GLUCOSEU", "HGBUR", "BILIRUBINUR", "KETONESUR", "PROTEINUR", "UROBILINOGEN", "NITRITE", "LEUKOCYTESUR" in the last 72 hours.  Invalid input(s): "APPERANCEUR"    Imaging: PERIPHERAL VASCULAR CATHETERIZATION  Result Date: 08/07/2022 See surgical note for result.  CARDIAC CATHETERIZATION  Result Date: 08/07/2022   Ost RCA to Prox RCA lesion is 100% stenosed.   Mid Cx  lesion is 90% stenosed.   Dist Cx lesion is 75% stenosed.   Ramus lesion is 50% stenosed.   Mid LAD lesion is 50% stenosed.   Dist RCA lesion is 100% stenosed.   There is moderate left ventricular systolic dysfunction.   LV end diastolic pressure is mildly elevated.   The left ventricular ejection fraction is 35-45% by visual estimate. Conclusion Successful left heart cath right radial approach Mild to moderate depressed left ventricular function inferior hypokinesis ejection fraction of around 40% with left ventricular enlargement Coronaries Left main large minor irregularities LAD large 50% mid otherwise minor irregularities Circumflex large 90% mid 75% distal Ramus large diffuse 50% Mixed dominant system RCA large 100% occluded proximally TIMI 0 flow CTO moderate to severe calcification Intervention deferred Recommend medical therapy Recommend dialysis today Contrast load was high at 280 cc because of difficulty with the case Consider intervention of circumflex in the future RCA is not amenable to percutaneous intervention  ECHOCARDIOGRAM COMPLETE  Result Date: 08/06/2022    ECHOCARDIOGRAM REPORT  Patient Name:   Lee Bryant. Date of Exam: 08/06/2022 Medical Rec #:  [id number]             Height:       73.0 in Accession #:    [id number]            Weight:       276.0 lb Date of Birth:  04/10/1956            BSA:          2.467 m Patient Age:    50 years              BP:           147/65 mmHg Patient Gender: M                     HR:           85 bpm. Exam Location:  [facility] Procedure: 2D Echo, Color Doppler, Cardiac Doppler and Intracardiac            Opacification Agent Indications:     I21.4 NSTEMI  History:         Patient has prior history of Echocardiogram examinations, most                  recent 03/14/2017. CAD, CKD; Risk Factors:Hypertension, Diabetes                  and Dyslipidemia.  Sonographer:     Charmayne Sheer Referring Phys:  [place] Diagnosing Phys: Yolonda Kida MD   Sonographer Comments: Image acquisition challenging due to patient body habitus. IMPRESSIONS  1. Left ventricular ejection fraction, by estimation, is 50 to 55%. The left ventricle has low normal function. The left ventricle demonstrates global hypokinesis. The left ventricular internal cavity size was moderately to severely dilated. There is moderate concentric left ventricular hypertrophy. Left ventricular diastolic parameters are consistent with Grade II diastolic dysfunction (pseudonormalization).  2. Right ventricular systolic function is normal. The right ventricular size is normal.  3. Left atrial size was moderately dilated.  4. Right atrial size was mild to moderately dilated.  5. The mitral valve is grossly normal. Mild mitral valve regurgitation.  6. The aortic valve is calcified. Aortic valve regurgitation is trivial. Aortic valve sclerosis/calcification is present, without any evidence of aortic stenosis.  7. Aortic dilatation noted. There is moderate dilatation of the aortic root. FINDINGS  Left Ventricle: Left ventricular ejection fraction, by estimation, is 50 to 55%. The left ventricle has low normal function. The left ventricle demonstrates global hypokinesis. Definity contrast agent was given IV to delineate the left ventricular endocardial borders. The left ventricular internal cavity size was moderately to severely dilated. There is moderate concentric left ventricular hypertrophy. Left ventricular diastolic parameters are consistent with Grade II diastolic dysfunction (pseudonormalization). Right Ventricle: The right ventricular size is normal. No increase in right ventricular wall thickness. Right ventricular systolic function is normal. Left Atrium: Left atrial size was moderately dilated. Right Atrium: Right atrial size was mild to moderately dilated. Pericardium: There is no evidence of pericardial effusion. Mitral Valve: The mitral valve is grossly normal. Mild mitral valve regurgitation.  Tricuspid Valve: The tricuspid valve is normal in structure. Tricuspid valve regurgitation is mild. Aortic Valve: The aortic valve is calcified. Aortic valve regurgitation is trivial. Aortic valve sclerosis/calcification is present, without any evidence of aortic stenosis. Aortic valve mean gradient measures 7.0 mmHg. Aortic valve  peak gradient measures 13.5 mmHg. Aortic valve area, by VTI measures 2.69 cm. Pulmonic Valve: The pulmonic valve was grossly normal. Pulmonic valve regurgitation is not visualized. Aorta: The ascending aorta was not well visualized and aortic dilatation noted. There is moderate dilatation of the aortic root. IAS/Shunts: No atrial level shunt detected by color flow Doppler.  LEFT VENTRICLE PLAX 2D LVIDd:         5.60 cm      Diastology LVIDs:         4.20 cm      LV e' medial:    6.09 cm/s LV PW:         1.70 cm      LV E/e' medial:  22.8 LV IVS:        1.50 cm      LV e' lateral:   12.10 cm/s LVOT diam:     2.30 cm      LV E/e' lateral: 11.5 LV SV:         81 LV SV Index:   33 LVOT Area:     4.15 cm  LV Volumes (MOD) LV vol d, MOD A2C: 166.0 ml LV vol d, MOD A4C: 218.0 ml LV vol s, MOD A2C: 63.5 ml LV vol s, MOD A4C: 80.1 ml LV SV MOD A2C:     102.5 ml LV SV MOD A4C:     218.0 ml LV SV MOD BP:      115.0 ml RIGHT VENTRICLE RV Basal diam:  4.30 cm RV S prime:     19.10 cm/s LEFT ATRIUM              Index        RIGHT ATRIUM           Index LA diam:        5.20 cm  2.11 cm/m   RA Area:     14.70 cm LA Vol (A2C):   103.0 ml 41.75 ml/m  RA Volume:   33.70 ml  13.66 ml/m LA Vol (A4C):   84.3 ml  34.17 ml/m LA Biplane Vol: 95.8 ml  38.83 ml/m  AORTIC VALVE                     PULMONIC VALVE AV Area (Vmax):    2.53 cm      PV Vmax:       1.25 m/s AV Area (Vmean):   2.49 cm      PV Vmean:      85.200 cm/s AV Area (VTI):     2.69 cm      PV VTI:        0.246 m AV Vmax:           184.00 cm/s   PV Peak grad:  6.2 mmHg AV Vmean:          125.000 cm/s  PV Mean grad:  3.0 mmHg AV VTI:             0.301 m AV Peak Grad:      13.5 mmHg AV Mean Grad:      7.0 mmHg LVOT Vmax:         112.00 cm/s LVOT Vmean:        74.800 cm/s LVOT VTI:          0.195 m LVOT/AV VTI ratio: 0.65  AORTA Ao Root diam: 4.60 cm MITRAL VALVE MV Area (PHT): 4.49 cm     SHUNTS MV Decel  Time: 169 msec     Systemic VTI:  0.20 m MV E velocity: 139.00 cm/s  Systemic Diam: 2.30 cm MV A velocity: 88.90 cm/s MV E/A ratio:  1.56 Dwayne D Callwood MD Electronically signed by Yolonda Kida MD Signature Date/Time: 08/06/2022/5:14:00 PM    Final      Medications:    sodium chloride Stopped (08/05/22 2253)   sodium chloride     [START ON 08/08/2022] sodium chloride     sodium chloride      [MAR Hold] sodium chloride   Intravenous Once   [MAR Hold] amLODipine  7.5 mg Oral Daily   [MAR Hold] aspirin EC  81 mg Oral Daily   [MAR Hold] carvedilol  3.125 mg Oral BID WC   [START ON 08/08/2022] Chlorhexidine Gluconate Cloth  6 each Topical Q0600   [MAR Hold] cloNIDine  0.3 mg Oral BID   [MAR Hold] doxazosin  4 mg Oral QHS   [MAR Hold] DULoxetine  30 mg Oral Daily   [MAR Hold] fenofibrate  54 mg Oral Daily   [MAR Hold] ferrous gluconate  324 mg Oral Q breakfast   [MAR Hold] gabapentin  600 mg Oral TID   [MAR Hold] hydrALAZINE  100 mg Oral TID   [MAR Hold] insulin aspart  0-20 Units Subcutaneous TID WC   [MAR Hold] insulin glargine-yfgn  10 Units Subcutaneous QHS   [MAR Hold] isosorbide mononitrate  60 mg Oral Daily   [MAR Hold] losartan  100 mg Oral Daily   [MAR Hold] rOPINIRole  0.5 mg Oral QHS   [MAR Hold] rosuvastatin  40 mg Oral Daily   [MAR Hold] sodium chloride flush  3 mL Intravenous Q12H   sodium chloride flush  3 mL Intravenous Q12H   sodium chloride, sodium chloride, [MAR Hold] acetaminophen, acetaminophen, [MAR Hold] cyclobenzaprine, fentaNYL, Heparin (Porcine) in NaCl, heparin sodium (porcine), hydrALAZINE, [MAR Hold] HYDROcodone-acetaminophen, iohexol, labetalol, lidocaine (PF), midazolam, [MAR Hold]  nitroGLYCERIN, [MAR Hold] ondansetron (ZOFRAN) IV, ondansetron (ZOFRAN) IV, sodium chloride flush, sodium chloride flush, verapamil  Assessment/ Plan:  [name]. is a 66 y.o.  male past medical conditions including diabetes, hypertension, hyperlipidemia, anemia, and chronic kidney disease stage V.  Patient presents to the emergency department with complaints of chest pain and has been admitted for NSTEMI (non-ST elevated myocardial infarction) ([place]) [I21.4]   Acute Kidney Injury on chronic kidney disease stage V with baseline creatinine 4.8 and GFR of 12 on 05/12/22.  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  Kidney transplant work-up underway.   Patient prefers home modalities and was preparing for peritoneal dialysis evaluation.   Pt needs acute need for dialysis today I contacted Vascular surgery for need of temporary cath      Lab Results  Component Value Date   CREATININE 4.89 (H) 08/07/2022   CREATININE 4.95 (H) 08/06/2022   CREATININE 5.02 (H) 08/05/2022    Intake/Output Summary (Last 24 hours) at 08/07/2022 1625 Last data filed at 08/07/2022 0907 Gross per 24 hour  Intake 4.57 ml  Output --  Net 4.57 ml    2. Anemia of chronic kidney disease Lab Results  Component Value Date   HGB 6.9 (L) 08/07/2022    Hemoglobin below desired target.  I discussed with  primary team  about need for blood transfusion. Patient received 2 units of PRBC  3. Secondary Hyperparathyroidism: Presumed  PTH 71, phosphorus 4.7, calcium 9.4 on 06/24/22.  Lab Results  Component Value Date   CALCIUM 8.7 (L) 08/07/2022    We will continue to monitor bone minerals during this admission.  4. 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 Home regimen includes glimepiride and metformin. Most recent hemoglobin A1c is 5.5 on 08/05/2022.   5.  NSTEMI Patient underwent left  heart cath today I later discussion with patient's cardiologist Secondary to patient's anatomy patient required more than usual amount of contrast Patient cardiologist was concerned about Patient's worsening  fluid overload    Addendum Temporary catheter was placed    LOS: 2 Shams Fill s Bishoy Cupp 10/20/20234:25 PM

## 2022-08-08 LAB — RENAL FUNCTION PANEL
Albumin: 3.9 g/dL (ref 3.5–5.0)
Anion gap: 7 (ref 5–15)
BUN: 26 mg/dL — ABNORMAL HIGH (ref 8–23)
CO2: 27 mmol/L (ref 22–32)
Calcium: 8.9 mg/dL (ref 8.9–10.3)
Chloride: 101 mmol/L (ref 98–111)
Creatinine, Ser: 2.47 mg/dL — ABNORMAL HIGH (ref 0.61–1.24)
GFR, Estimated: 28 mL/min — ABNORMAL LOW (ref >60)
Glucose, Bld: 100 mg/dL — ABNORMAL HIGH (ref 70–99)
Phosphorus: 2.1 mg/dL — ABNORMAL LOW (ref 2.5–4.6)
Potassium: 3.5 mmol/L (ref 3.5–5.1)
Sodium: 135 mmol/L (ref 135–145)

## 2022-08-08 LAB — GLUCOSE, CAPILLARY
Glucose-Capillary: 90 mg/dL (ref 70–99)
Glucose-Capillary: 142 mg/dL — ABNORMAL HIGH (ref 70–99)
Glucose-Capillary: 95 mg/dL (ref 70–99)
Glucose-Capillary: 149 mg/dL — ABNORMAL HIGH (ref 70–99)

## 2022-08-08 LAB — BPAM RBC
Blood Product Expiration Date: 202311072359
ISSUE DATE / TIME: 202310201852
Unit Type and Rh: 6200

## 2022-08-08 LAB — CBC
HCT: 26.7 % — ABNORMAL LOW (ref 39.0–52.0)
Hemoglobin: 8.7 g/dL — ABNORMAL LOW (ref 13.0–17.0)
MCH: 27.3 pg (ref 26.0–34.0)
MCHC: 32.6 g/dL (ref 30.0–36.0)
MCV: 83.7 fL (ref 80.0–100.0)
Platelets: 204 10*3/uL (ref 150–400)
RBC: 3.19 MIL/uL — ABNORMAL LOW (ref 4.22–5.81)
RDW: 14.6 % (ref 11.5–15.5)
WBC: 7.2 10*3/uL (ref 4.0–10.5)
nRBC: 0 % (ref 0.0–0.2)

## 2022-08-08 LAB — TYPE AND SCREEN
ABO/RH(D): A POS
Antibody Screen: NEGATIVE
Unit division: 0

## 2022-08-08 NOTE — Progress Notes (Signed)
Post hd rn assessment 

## 2022-08-08 NOTE — Progress Notes (Signed)
SUBJECTIVE: Patient resting on side of bed, denies chest pain, shortness of breath, dizziness. Received dialysis this morning. No fluid removed.    Vitals:   08/08/22 1111 08/08/22 1115 08/08/22 1128 08/08/22 1204  BP: (!) 164/104 (!) 177/81  (!) 168/91  Pulse: 79 80  78  Resp: 19 15  19   Temp: 98.4 F (36.9 C)   98 F (36.7 C)  TempSrc: Oral   Oral  SpO2: 98% 98%  96%  Weight:   120.1 kg   Height:        Intake/Output Summary (Last 24 hours) at 08/08/2022 1341 Last data filed at 08/08/2022 1205 Gross per 24 hour  Intake 636.67 ml  Output 700 ml  Net -63.33 ml    LABS: Basic Metabolic Panel: Recent Labs    08/07/22 0604 08/08/22 1111  NA 136 135  K 4.4 3.5  CL 109 101  CO2 23 27  GLUCOSE 113* 100*  BUN 67* 26*  CREATININE 4.89* 2.47*  CALCIUM 8.7* 8.9  PHOS  --  2.1*   Liver Function Tests: Recent Labs    08/08/22 1111  ALBUMIN 3.9   No results for input(s): "LIPASE", "AMYLASE" in the last 72 hours. CBC: Recent Labs    08/07/22 2326 08/08/22 1111  WBC 7.6 7.2  HGB 8.1* 8.7*  HCT 25.2* 26.7*  MCV 84.6 83.7  PLT 205 204   Cardiac Enzymes: No results for input(s): "CKTOTAL", "CKMB", "CKMBINDEX", "TROPONINI" in the last 72 hours. BNP: Invalid input(s): "POCBNP" D-Dimer: No results for input(s): "DDIMER" in the last 72 hours. Hemoglobin A1C: Recent Labs    08/05/22 1547  HGBA1C 5.5   Fasting Lipid Panel: Recent Labs    08/06/22 0303  CHOL 118  HDL 25*  LDLCALC 59  TRIG 171*  CHOLHDL 4.7   Thyroid Function Tests: No results for input(s): "TSH", "T4TOTAL", "T3FREE", "THYROIDAB" in the last 72 hours.  Invalid input(s): "FREET3" Anemia Panel: Recent Labs    08/07/22 0604 08/07/22 0918  VITAMINB12  --  1,270*  FOLATE 19.7  --   FERRITIN 98  --   TIBC 367  --   IRON 71  --   RETICCTPCT  --  1.8     PHYSICAL EXAM General: Well developed, well nourished, in no acute distress HEENT:  Normocephalic and atramatic Neck:  No JVD.   Lungs: Clear bilaterally to auscultation and percussion. Heart: HRRR . Normal S1 and S2 without gallops or murmurs.  Abdomen: Bowel sounds are positive, abdomen soft and non-tender  Msk:  Back normal, normal gait. Normal strength and tone for age. Extremities: No clubbing, cyanosis or edema.   Neuro: Alert and oriented X 3. Psych:  Good affect, responds appropriately  TELEMETRY: sinus rhythm, HR 78 bpm  ASSESSMENT AND PLAN: Patient presented to ED on 08/05/22 with NSTEMI. Patient underwent cardiac cath on 08/07/22 with intervention. Patient currently asymptomatic. From a cardiac standpoint, patient can be discharged home with a close follow up with [facility] cardiology.   Principal Problem:   NSTEMI (non-ST elevated myocardial infarction) ([place]) Active Problems:   Obesity (BMI 30-39.9)   DM2 (diabetes mellitus, type 2) ([facility])   HTN (hypertension)   CKD (chronic kidney disease), stage V ([facility])   Anemia due to chronic kidney disease    Lee Sciortino, FNP-C 08/08/2022 1:41 PM

## 2022-08-08 NOTE — Progress Notes (Signed)
Pt 2.5 hour 2nd HD treatment complete w/ no complications. Alert, no c/o, vss, report to primary RN. Start: 0840 End: 1111 No fluid removed 37.5L BVP No HD meds ordered 120.1kg post weight

## 2022-08-08 NOTE — Progress Notes (Signed)
[facility]  Dialysis Note   Subjective:   Seen and examined on hemodialysis treatment.  Dialysis via temporary catheter.  Second treatment.   HEMODIALYSIS FLOWSHEET:  Blood Flow Rate (mL/min): 250 mL/min Arterial Pressure (mmHg): -110 mmHg Venous Pressure (mmHg): 120 mmHg TMP (mmHg): 23 mmHg Ultrafiltration Rate (mL/min): 280 mL/min Dialysate Flow Rate (mL/min): 200 ml/min Dialysis Fluid Bolus: Blood Products Bolus Amount (mL): 360 mL    Objective:  Vital signs in last 24 hours:  Temp:  [97.6 F (36.4 C)-99.2 F (37.3 C)] 98.4 F (36.9 C) (10/21 1111) Pulse Rate:  [57-89] 80 (10/21 1115) Resp:  [8-22] 15 (10/21 1115) BP: (104-182)/(52-112) 177/81 (10/21 1115) SpO2:  [94 %-99 %] 98 % (10/21 1115) Weight:  [120.2 kg-125.4 kg] 120.2 kg (10/20 2040)  Weight change:  Filed Weights   08/05/22 1543 08/07/22 1817 08/07/22 2040  Weight: 125.2 kg 125.4 kg 120.2 kg    Intake/Output: I/O last 3 completed shifts: In: 281.2 [I.V.:4.6; Blood:276.7] Out: 0    Intake/Output this shift:  No intake/output data recorded.  Physical Exam: General: NAD,   Head: Normocephalic, atraumatic. Moist oral mucosal membranes  Eyes: Anicteric, PERRL  Neck: Supple, trachea midline  Lungs:  Clear to auscultation  Heart: Regular rate and rhythm  Abdomen:  Soft, nontender,   Extremities:  peripheral edema.  Neurologic: Nonfocal, moving all four extremities  Skin: No lesions  Access:     Basic Metabolic Panel: Recent Labs  Lab 08/05/22 1545 08/06/22 0303 08/07/22 0604  NA 135 137 136  K 4.3 4.1 4.4  CL 106 110 109  CO2 21* 19* 23  GLUCOSE 123* 165* 113*  BUN 76* 69* 67*  CREATININE 5.02* 4.95* 4.89*  CALCIUM 9.4 8.9 8.7*    Liver Function Tests: No results for input(s): "AST", "ALT", "ALKPHOS", "BILITOT", "PROT", "ALBUMIN" in the last 168 hours. No results for input(s): "LIPASE", "AMYLASE" in the last 168 hours. No results for input(s): "AMMONIA" in the last  168 hours.  CBC: Recent Labs  Lab 08/05/22 1545 08/06/22 0303 08/07/22 0604 08/07/22 2326 08/08/22 1111  WBC 7.6 7.1 7.1 7.6 7.2  HGB 8.3* 7.1* 6.9* 8.1* 8.7*  HCT 26.9* 23.2* 23.1* 25.2* 26.7*  MCV 87.6 86.9 88.5 84.6 83.7  PLT 238 216 203 205 204    Cardiac Enzymes: No results for input(s): "CKTOTAL", "CKMB", "CKMBINDEX", "TROPONINI" in the last 168 hours.  BNP: Invalid input(s): "POCBNP"  CBG: Recent Labs  Lab 08/06/22 1740 08/06/22 2236 08/07/22 0803 08/07/22 2117 08/08/22 0721  GLUCAP 155* 90 113* 94 90    Microbiology: Results for orders placed or performed during the hospital encounter of 03/12/17  MRSA PCR Screening     Status: None   Collection Time: 03/12/17  1:26 PM   Specimen: Nasal Mucosa; Nasopharyngeal  Result Value Ref Range Status   MRSA by PCR NEGATIVE NEGATIVE Final    Comment:        The GeneXpert MRSA Assay (FDA approved for NASAL specimens only), is one component of a comprehensive MRSA colonization surveillance program. It is not intended to diagnose MRSA infection nor to guide or monitor treatment for MRSA infections.   Aerobic Culture  (superficial specimen)     Status: None   Collection Time: 03/12/17  6:00 PM   Specimen: Ankle; Wound  Result Value Ref Range Status   Specimen Description ANKLE RIGHT ANKLE AND BACK OF ANKLE ULCERS  Final   Special Requests Normal  Final   Gram Stain   Final  MODERATE WBC PRESENT, PREDOMINANTLY PMN ABUNDANT GRAM NEGATIVE RODS MODERATE GRAM POSITIVE COCCI IN PAIRS IN CLUSTERS FEW GRAM POSITIVE RODS    Culture   Final    ABUNDANT STAPHYLOCOCCUS AUREUS WITHIN MIXED CULTURE Performed at [facility], [place] [address], [place], [place] [address]    Report Status 03/15/2017 FINAL  Final   Organism ID, Bacteria STAPHYLOCOCCUS AUREUS  Final      Susceptibility   Staphylococcus aureus - MIC*    CIPROFLOXACIN >=8 RESISTANT Resistant     ERYTHROMYCIN >=8 RESISTANT Resistant     GENTAMICIN  <=0.5 SENSITIVE Sensitive     OXACILLIN 0.5 SENSITIVE Sensitive     TETRACYCLINE <=1 SENSITIVE Sensitive     VANCOMYCIN 1 SENSITIVE Sensitive     TRIMETH/SULFA <=10 SENSITIVE Sensitive     CLINDAMYCIN <=0.25 SENSITIVE Sensitive     RIFAMPIN <=0.5 SENSITIVE Sensitive     Inducible Clindamycin NEGATIVE Sensitive     * ABUNDANT STAPHYLOCOCCUS AUREUS    Coagulation Studies: Recent Labs    08/05/22 1743  LABPROT 14.6  INR 1.2    Urinalysis: No results for input(s): "COLORURINE", "LABSPEC", "PHURINE", "GLUCOSEU", "HGBUR", "BILIRUBINUR", "KETONESUR", "PROTEINUR", "UROBILINOGEN", "NITRITE", "LEUKOCYTESUR" in the last 72 hours.  Invalid input(s): "APPERANCEUR"    Imaging: PERIPHERAL VASCULAR CATHETERIZATION  Result Date: 08/07/2022 See surgical note for result.  CARDIAC CATHETERIZATION  Result Date: 08/07/2022   Ost RCA to Prox RCA lesion is 100% stenosed.   Mid Cx lesion is 90% stenosed.   Dist Cx lesion is 75% stenosed.   Ramus lesion is 50% stenosed.   Mid LAD lesion is 50% stenosed.   Dist RCA lesion is 100% stenosed.   There is moderate left ventricular systolic dysfunction.   LV end diastolic pressure is mildly elevated.   The left ventricular ejection fraction is 35-45% by visual estimate. Conclusion Successful left heart cath right radial approach Mild to moderate depressed left ventricular function inferior hypokinesis ejection fraction of around 40% with left ventricular enlargement Coronaries Left main large minor irregularities LAD large 50% mid otherwise minor irregularities Circumflex large 90% mid 75% distal Ramus large diffuse 50% Mixed dominant system RCA large 100% occluded proximally TIMI 0 flow CTO moderate to severe calcification Intervention deferred Recommend medical therapy Recommend dialysis today Contrast load was high at 280 cc because of difficulty with the case Consider intervention of circumflex in the future RCA is not amenable to percutaneous intervention     Medications:    sodium chloride Stopped (08/05/22 2253)   sodium chloride     anticoagulant sodium citrate      sodium chloride   Intravenous Once   amLODipine  7.5 mg Oral Daily   aspirin EC  81 mg Oral Daily   carvedilol  3.125 mg Oral BID WC   Chlorhexidine Gluconate Cloth  6 each Topical Q0600   cloNIDine  0.3 mg Oral BID   doxazosin  4 mg Oral QHS   DULoxetine  30 mg Oral Daily   fenofibrate  54 mg Oral Daily   ferrous gluconate  324 mg Oral Q breakfast   gabapentin  600 mg Oral TID   hydrALAZINE  100 mg Oral TID   insulin aspart  0-20 Units Subcutaneous TID WC   insulin glargine-yfgn  10 Units Subcutaneous QHS   isosorbide mononitrate  60 mg Oral Daily   losartan  100 mg Oral Daily   melatonin  5 mg Oral QHS   rOPINIRole  0.5 mg Oral QHS  rosuvastatin  40 mg Oral Daily   sodium chloride flush  3 mL Intravenous Q12H   sodium chloride flush  3 mL Intravenous Q12H   sodium chloride, acetaminophen, acetaminophen, alteplase, anticoagulant sodium citrate, cyclobenzaprine, heparin, HYDROcodone-acetaminophen, lidocaine (PF), lidocaine-prilocaine, nitroGLYCERIN, ondansetron (ZOFRAN) IV, ondansetron (ZOFRAN) IV, pentafluoroprop-tetrafluoroeth, sodium chloride flush  Assessment/ Plan:  [name]. is a 66 y.o.  male with a past medical history of diabetes, hypertension, hyperlipidemia, coronary artery disease, chronic kidney disease stage V with anemia now admitted with history of chest pain.  Patient is s/p cardiac cath.  He was initiated on hemodialysis treatments yesterday after cardiac cath.  Principal Problem:   NSTEMI (non-ST elevated myocardial infarction) ([place]) Active Problems:   Obesity (BMI 30-39.9)   DM2 (diabetes mellitus, type 2) (HCC)   HTN (hypertension)   CKD (chronic kidney disease), stage V (HCC)   Anemia due to chronic kidney disease   End Stage Renal Disease on hemodialysis: S/p cardiac cath.  This is his second dialysis treatment.  No  results found for: "POTASSIUM"  Intake/Output Summary (Last 24 hours) at 08/08/2022 1124 Last data filed at 08/07/2022 2032 Gross per 24 hour  Intake 276.67 ml  Output 0 ml  Net 276.67 ml    2. Hypertension with chronic kidney disease: We will monitor closely.   BP (!) 177/81 (BP Location: Right Arm)   Pulse 80   Temp 98.4 F (36.9 C) (Oral)   Resp 15   Ht 6\' 1"  (1.854 m)   Wt 120.2 kg   SpO2 98%   BMI 34.96 kg/m   3. Anemia of chronic kidney disease/ kidney injury/chronic disease/acute blood loss:   Lab Results  Component Value Date   HGB 8.7 (L) 08/08/2022  We will continue anemia protocol.  4. Secondary Hyperparathyroidism:     Lab Results  Component Value Date   CALCIUM 8.7 (L) 08/07/2022    We will continue to monitor the renal indices closely. Diet advised.   LOS: [place], MD [facility] 10/21/202311:24 AM

## 2022-08-08 NOTE — Progress Notes (Signed)
Progress Note   Patient: Lee Bryant. UMP:[id number] DOB: 10-23-1955 DOA: 08/05/2022     3 DOS: the patient was seen and examined on 08/08/2022   Brief hospital course: Taken from H&P.  Lee Bryant is followed for CKD V by [facility], HTN, HLD, obesity, DM2, renal related anemia. ON the morning of admission when walking to his care he had an episode of crushing chest pain with radiation of pain to the neck and acute dyspnea. He rested and his symptoms abated. He proceeded to see his PCP for acute chest pain. He was referred to [facility] for further evaluation with a high risk profile for ACS.    ED Course: afebrile. 155/66  HR 81  RR 16 BMI 36.4. Patient was in no distress at admission. Lab: glucose 123  BUN 76, Cr 5.02, Hgb 8.3 (chronic). CXR- NAD, EKG NSR, w/o ST elevation. Troponin #1 459, #2 383.  Patient was started on heparin infusion and cardiology was consulted.  10/19: Patient was experiencing intermittent chest pain with exertion, relieved with rest, gradually worsening.  Radiating to left jaw and sometimes her left arm and associated with shortness of breath which is very typical for ischemic pain.  Patient follow-up with his cardiologist and they are recommending further ischemic work-up with cardiac cath which is being delayed due to CKD stage V.  Message sent to nephrology as patient is recently being worked up for dialysis.  Patient might need to start dialysis for cardiac catheterization.  Cardiology to coordinate with nephrology before planning cardiac catheterization.  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  70 to 90% stenosis of circumflex . No stent was placed today.  Cardiology is currently recommending medical management and reattempt for circumflex stent placement at a later time. Patient received high doses of contrast due to difficult case and  most likely will need dialysis after this procedure.  Hemoglobin dropped to 6.9 this morning.  No obvious bleeding.  It was 7.1 yesterday. 2 unit of PRBC ordered for concern of recent ACS.  10/21: Hemodynamically stable.  Started on dialysis after the cardiac catheterization yesterday. Anemia panel with anemia of chronic disease.  Received 1 unit of PRBC instead of 2. Hemoglobin today at 8.7.  Received second dialysis today.     Assessment and Plan: * NSTEMI (non-ST elevated myocardial infarction) ([place]) Patient with multiple risk factors: DM, HTN, Obesity, CKD V with chronic anemia. Patient reports he had a nuclear stress test 1-2 years ago revealing CAD which has been managed medically. Over the last month he has had progressive DOE and reports 2-3 episodes of exertional chest pain that resolved with rest.  On the morning of admission he had another episode of crushing chest pain with radiation to the jaw and neck with acute dyspnea that was more severe than previous episodes consistent with unstable angina. He went to his PCP who referred him to [facility] for evaluation. EKG w/o acute STEMI; troponin #1 459, #2 383.   Cardiology was consulted-concern of angina and ACS. Patient underwent cardiac catheterization which shows multivessel disease, no intervention was done , cardiology is recommending medical management and reattempt for stent placement at a later time. -Continue with heparin infusion-cardiology will determine the duration. -Continue with aspirin, Imdur, and statin  CKD (chronic kidney disease), stage V (HCC) Significant improvement in BUN and creatinine after getting 1 session of dialysis yesterday.  Going for second  session today.  Most likely ESRD now. -Nephrology is following  DM2 (diabetes mellitus, type 2) ([place]) Seems well controlled with A1c of 5.5.  Patient was using only Amaryl at home. -Continue with basal and SSI  HTN (hypertension) Long standing problem. Currently   on ARB, CCB, peripheral dilator.  Plan Continue outpatient meds  Add beta-blocker    Obesity (BMI 30-39.9) Estimated body mass index is 36.41 kg/m as calculated from the following:   Height as of this encounter: 6\' 1"  (1.854 m).   Weight as of this encounter: 125.2 kg.   -This will complicate overall prognosis -Might get benefit by adding outpatient Ozempic  Anemia due to chronic kidney disease Hemoglobin dropped to 6.9 this morning.  No obvious bleeding but patient is on heparin infusion.  Most likely secondary to CKD stage V. -Ordered 2 unit of PRBC to keep hemoglobin above 8 for recent ACS. -Anemia panel -Continue to monitor -Transfuse if below 8   Subjective: Patient with no new complaint today.  Physical Exam: Vitals:   08/08/22 1111 08/08/22 1115 08/08/22 1128 08/08/22 1204  BP: (!) 164/104 (!) 177/81  (!) 168/91  Pulse: 79 80  78  Resp: 19 15  19   Temp: 98.4 F (36.9 C)   98 F (36.7 C)  TempSrc: Oral   Oral  SpO2: 98% 98%  96%  Weight:   120.1 kg   Height:       General.  Obese gentleman, in no acute distress. Pulmonary.  Lungs clear bilaterally, normal respiratory effort. CV.  Regular rate and rhythm, no JVD, rub or murmur. Abdomen.  Soft, nontender, nondistended, BS positive. CNS.  Alert and oriented .  No focal neurologic deficit. Extremities.  No edema, no cyanosis, pulses intact and symmetrical. Psychiatry.  Judgment and insight appears normal.   Data Reviewed: Prior data reviewed  Family Communication: Discussed with wife in the room.  Disposition: Status is: Inpatient Remains inpatient appropriate because: Severity of illness   Planned Discharge Destination: Home  DVT prophylaxis.  Heparin GTT Time spent: 45 minutes  This record has been created using Systems analyst. Errors have been sought and corrected,but may not always be located. Such creation errors do not reflect on the standard of care.  Author: Lorella Nimrod,  MD 08/08/2022 3:33 PM  For on call review [email or web address].

## 2022-08-08 NOTE — Assessment & Plan Note (Signed)
Seems well controlled with A1c of 5.5.  Patient was using only Amaryl at home. -Continue with basal and SSI

## 2022-08-08 NOTE — Progress Notes (Signed)
Pre hd rn assessment 

## 2022-08-09 LAB — GLUCOSE, CAPILLARY
Glucose-Capillary: 127 mg/dL — ABNORMAL HIGH (ref 70–99)
Glucose-Capillary: 168 mg/dL — ABNORMAL HIGH (ref 70–99)
Glucose-Capillary: 98 mg/dL (ref 70–99)
Glucose-Capillary: 165 mg/dL — ABNORMAL HIGH (ref 70–99)

## 2022-08-09 NOTE — Progress Notes (Signed)
[facility]  PROGRESS NOTE   Subjective:   Out of bed to chair and is anxious to go home.  Feels much better today.  Objective:  Vital signs: Blood pressure (!) 129/51, pulse 64, temperature 98.2 F (36.8 C), resp. rate 16, height 6\' 1"  (1.854 m), weight 119.1 kg, SpO2 97 %.  Intake/Output Summary (Last 24 hours) at 08/09/2022 1100 Last data filed at 08/08/2022 1632 Gross per 24 hour  Intake 360 ml  Output 850 ml  Net -490 ml   Filed Weights   08/07/22 2040 08/08/22 1128 08/09/22 1048  Weight: 120.2 kg 120.1 kg 119.1 kg     Physical Exam: General:  No acute distress  Head:  Normocephalic, atraumatic. Moist oral mucosal membranes  Eyes:  Anicteric  Neck:  Supple  Lungs:   Clear to auscultation, normal effort  Heart:  S1S2 no rubs  Abdomen:   Soft, nontender, bowel sounds present  Extremities:  peripheral edema.  Neurologic:  Awake, alert, following commands  Skin:  No lesions  Access:     Basic Metabolic Panel: Recent Labs  Lab 08/05/22 1545 08/06/22 0303 08/07/22 0604 08/08/22 1111  NA 135 137 136 135  K 4.3 4.1 4.4 3.5  CL 106 110 109 101  CO2 21* 19* 23 27  GLUCOSE 123* 165* 113* 100*  BUN 76* 69* 67* 26*  CREATININE 5.02* 4.95* 4.89* 2.47*  CALCIUM 9.4 8.9 8.7* 8.9  PHOS  --   --   --  2.1*    CBC: Recent Labs  Lab 08/05/22 1545 08/06/22 0303 08/07/22 0604 08/07/22 2326 08/08/22 1111  WBC 7.6 7.1 7.1 7.6 7.2  HGB 8.3* 7.1* 6.9* 8.1* 8.7*  HCT 26.9* 23.2* 23.1* 25.2* 26.7*  MCV 87.6 86.9 88.5 84.6 83.7  PLT 238 216 203 205 204     Urinalysis: No results for input(s): "COLORURINE", "LABSPEC", "PHURINE", "GLUCOSEU", "HGBUR", "BILIRUBINUR", "KETONESUR", "PROTEINUR", "UROBILINOGEN", "NITRITE", "LEUKOCYTESUR" in the last 72 hours.  Invalid input(s): "APPERANCEUR"    Imaging: PERIPHERAL VASCULAR CATHETERIZATION  Result Date: 08/07/2022 See surgical note for result.  CARDIAC CATHETERIZATION  Result Date: 08/07/2022   Ost  RCA to Prox RCA lesion is 100% stenosed.   Mid Cx lesion is 90% stenosed.   Dist Cx lesion is 75% stenosed.   Ramus lesion is 50% stenosed.   Mid LAD lesion is 50% stenosed.   Dist RCA lesion is 100% stenosed.   There is moderate left ventricular systolic dysfunction.   LV end diastolic pressure is mildly elevated.   The left ventricular ejection fraction is 35-45% by visual estimate. Conclusion Successful left heart cath right radial approach Mild to moderate depressed left ventricular function inferior hypokinesis ejection fraction of around 40% with left ventricular enlargement Coronaries Left main large minor irregularities LAD large 50% mid otherwise minor irregularities Circumflex large 90% mid 75% distal Ramus large diffuse 50% Mixed dominant system RCA large 100% occluded proximally TIMI 0 flow CTO moderate to severe calcification Intervention deferred Recommend medical therapy Recommend dialysis today Contrast load was high at 280 cc because of difficulty with the case Consider intervention of circumflex in the future RCA is not amenable to percutaneous intervention    Medications:    sodium chloride Stopped (08/05/22 2253)   sodium chloride      sodium chloride   Intravenous Once   amLODipine  7.5 mg Oral Daily   aspirin EC  81 mg Oral Daily   carvedilol  3.125 mg Oral BID WC   Chlorhexidine  Gluconate Cloth  6 each Topical Q0600   cloNIDine  0.3 mg Oral BID   doxazosin  4 mg Oral QHS   DULoxetine  30 mg Oral Daily   fenofibrate  54 mg Oral Daily   ferrous gluconate  324 mg Oral Q breakfast   gabapentin  600 mg Oral TID   hydrALAZINE  100 mg Oral TID   insulin aspart  0-20 Units Subcutaneous TID WC   insulin glargine-yfgn  10 Units Subcutaneous QHS   isosorbide mononitrate  60 mg Oral Daily   losartan  100 mg Oral Daily   melatonin  5 mg Oral QHS   rOPINIRole  0.5 mg Oral QHS   rosuvastatin  40 mg Oral Daily   sodium chloride flush  3 mL Intravenous Q12H   sodium chloride flush   3 mL Intravenous Q12H    Assessment/ Plan:     Principal Problem:   NSTEMI (non-ST elevated myocardial infarction) ([place]) Active Problems:   Obesity (BMI 30-39.9)   DM2 (diabetes mellitus, type 2) (HCC)   HTN (hypertension)   CKD (chronic kidney disease), stage V (HCC)   Anemia due to chronic kidney disease  66 year old male with history of hypertension, coronary artery disease, diabetes, obesity, chronic kidney disease stage IV/V initially admitted for chest pain.  Had his cardiac catheterization showing two-vessel disease with right coronary artery and circumflex.  No intervention was done and was opted for medical treatment.  Since patient received large amount of contrast he was initiated on dialysis and had to stable treatments.  Patient also had low hemoglobin and was given transfusion.  #1: CKD/end-stage renal disease: Patient has history of CKD stage V now s/p IV contrast.  Initiated on hemodialysis treatments and has been tolerating well.  Patient for outpatient placement versus home hemodialysis training.  We will repeat blood work tomorrow.  #2: Anemia: Anemia possibly secondary to chronic kidney disease.  We will follow anemia protocols.  #3: Hypertension: Blood pressure is better controlled at this time.  We may have to slowly wean him off of some of the medications.  2 g salt restricted diet advised.  #4: Diabetes: Continue insulin as ordered.  #5: Obesity: Diet advised and spoke to patient on the importance of exercise.  Spoke to the family at bedside.   LOS: [place], MD [facility] 10/22/202311:00 AM

## 2022-08-09 NOTE — Progress Notes (Signed)
Progress Note   Patient: Lee Bryant. CHY:[id number] DOB: 15-Nov-1955 DOA: 08/05/2022     4 DOS: the patient was seen and examined on 08/09/2022   Brief hospital course: Taken from H&P.  Mr. Loppnow is followed for CKD V by [facility], HTN, HLD, obesity, DM2, renal related anemia. ON the morning of admission when walking to his care he had an episode of crushing chest pain with radiation of pain to the neck and acute dyspnea. He rested and his symptoms abated. He proceeded to see his PCP for acute chest pain. He was referred to [facility] for further evaluation with a high risk profile for ACS.    ED Course: afebrile. 155/66  HR 81  RR 16 BMI 36.4. Patient was in no distress at admission. Lab: glucose 123  BUN 76, Cr 5.02, Hgb 8.3 (chronic). CXR- NAD, EKG NSR, w/o ST elevation. Troponin #1 459, #2 383.  Patient was started on heparin infusion and cardiology was consulted.  10/19: Patient was experiencing intermittent chest pain with exertion, relieved with rest, gradually worsening.  Radiating to left jaw and sometimes her left arm and associated with shortness of breath which is very typical for ischemic pain.  Patient follow-up with his cardiologist and they are recommending further ischemic work-up with cardiac cath which is being delayed due to CKD stage V.  Message sent to nephrology as patient is recently being worked up for dialysis.  Patient might need to start dialysis for cardiac catheterization.  Cardiology to coordinate with nephrology before planning cardiac catheterization.  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  70 to 90% stenosis of circumflex . No stent was placed today.  Cardiology is currently recommending medical management and reattempt for circumflex stent placement at a later time. Patient received high doses of contrast due to difficult case and  most likely will need dialysis after this procedure.  Hemoglobin dropped to 6.9 this morning.  No obvious bleeding.  It was 7.1 yesterday. 2 unit of PRBC ordered for concern of recent ACS.  10/21: Hemodynamically stable.  Started on dialysis after the cardiac catheterization yesterday. Anemia panel with anemia of chronic disease.  Received 1 unit of PRBC instead of 2. Hemoglobin today at 8.7.  Received second dialysis today.    10/22: Patient remained stable and tolerated 2 treatments of dialysis well.  Third treatment tomorrow.  Will need outpatient established dialysis chair versus set up of home dialysis before discharge.  No chest pain.  Stable for discharge from cardiac standpoint with outpatient follow-up for further ischemic management.  Most likely will need bypass versus PCI.   Assessment and Plan: * NSTEMI (non-ST elevated myocardial infarction) ([place]) Patient with multiple risk factors: DM, HTN, Obesity, CKD V with chronic anemia. Patient reports he had a nuclear stress test 1-2 years ago revealing CAD which has been managed medically. Over the last month he has had progressive DOE and reports 2-3 episodes of exertional chest pain that resolved with rest.  On the morning of admission he had another episode of crushing chest pain with radiation to the jaw and neck with acute dyspnea that was more severe than previous episodes consistent with unstable angina. He went to his PCP who referred him to [facility] for evaluation. EKG w/o acute STEMI; troponin #1 459, #2 383.   Cardiology was consulted-concern of angina and ACS. Patient underwent cardiac catheterization which shows multivessel disease, no  intervention was done , cardiology is recommending medical management and reattempt for stent placement at a later time. -Continue with heparin infusion-cardiology will determine the duration. -Continue with aspirin, Imdur, and statin  CKD (chronic kidney disease), stage V (HCC) Significant  improvement in BUN and creatinine after getting 1 session of dialysis yesterday.  Going for second session today.  Most likely ESRD now. -Nephrology is following  DM2 (diabetes mellitus, type 2) ([place]) Seems well controlled with A1c of 5.5.  Patient was using only Amaryl at home. -Continue with basal and SSI  HTN (hypertension) Long standing problem. Currently  on ARB, CCB, peripheral dilator.  Plan Continue outpatient meds  Add beta-blocker    Obesity (BMI 30-39.9) Estimated body mass index is 36.41 kg/m as calculated from the following:   Height as of this encounter: 6\' 1"  (1.854 m).   Weight as of this encounter: 125.2 kg.   -This will complicate overall prognosis -Might get benefit by adding outpatient Ozempic  Anemia due to chronic kidney disease Hemoglobin dropped to 6.9 this morning.  No obvious bleeding but patient is on heparin infusion.  Most likely secondary to CKD stage V. -Ordered 2 unit of PRBC to keep hemoglobin above 8 for recent ACS. -Anemia panel -Continue to monitor -Transfuse if below 8   Subjective: Patient was seen and examined today.  No new complaint.  Denies any chest pain.  No problem during dialysis.  Physical Exam: Vitals:   08/09/22 0451 08/09/22 0716 08/09/22 1048 08/09/22 1122  BP: (!) 115/59 (!) 129/51  (!) 140/72  Pulse: (!) 57 64  70  Resp: 16 16  18   Temp: 98.1 F (36.7 C) 98.2 F (36.8 C)  98.2 F (36.8 C)  TempSrc: Oral     SpO2: 96% 97%  98%  Weight:   119.1 kg   Height:   6\' 1"  (1.854 m)    General.  Obese gentleman, in no acute distress. Pulmonary.  Lungs clear bilaterally, normal respiratory effort. CV.  Regular rate and rhythm, no JVD, rub or murmur. Abdomen.  Soft, nontender, nondistended, BS positive. CNS.  Alert and oriented .  No focal neurologic deficit. Extremities.  No edema, no cyanosis, pulses intact and symmetrical. Psychiatry.  Judgment and insight appears normal.   Data Reviewed: Prior data  reviewed  Family Communication: Discussed with wife in the room.  Disposition: Status is: Inpatient Remains inpatient appropriate because: Severity of illness   Planned Discharge Destination: Home  DVT prophylaxis.  Heparin GTT Time spent: 40 minutes  This record has been created using Systems analyst. Errors have been sought and corrected,but may not always be located. Such creation errors do not reflect on the standard of care.  Author: Lorella Nimrod, MD 08/09/2022 3:46 PM  For on call review [email or web address].

## 2022-08-09 NOTE — Progress Notes (Signed)
SUBJECTIVE: Patient resting in bed, denies chest pain, shortness of breath, dizziness.   Vitals:   08/09/22 0451 08/09/22 0716 08/09/22 1048 08/09/22 1122  BP: (!) 115/59 (!) 129/51  (!) 140/72  Pulse: (!) 57 64  70  Resp: 16 16  18   Temp: 98.1 F (36.7 C) 98.2 F (36.8 C)  98.2 F (36.8 C)  TempSrc: Oral     SpO2: 96% 97%  98%  Weight:   119.1 kg   Height:   6\' 1"  (1.854 m)     Intake/Output Summary (Last 24 hours) at 08/09/2022 1416 Last data filed at 08/08/2022 1632 Gross per 24 hour  Intake --  Output 450 ml  Net -450 ml    LABS: Basic Metabolic Panel: Recent Labs    08/07/22 0604 08/08/22 1111  NA 136 135  K 4.4 3.5  CL 109 101  CO2 23 27  GLUCOSE 113* 100*  BUN 67* 26*  CREATININE 4.89* 2.47*  CALCIUM 8.7* 8.9  PHOS  --  2.1*   Liver Function Tests: Recent Labs    08/08/22 1111  ALBUMIN 3.9   No results for input(s): "LIPASE", "AMYLASE" in the last 72 hours. CBC: Recent Labs    08/07/22 2326 08/08/22 1111  WBC 7.6 7.2  HGB 8.1* 8.7*  HCT 25.2* 26.7*  MCV 84.6 83.7  PLT 205 204   Cardiac Enzymes: No results for input(s): "CKTOTAL", "CKMB", "CKMBINDEX", "TROPONINI" in the last 72 hours. BNP: Invalid input(s): "POCBNP" D-Dimer: No results for input(s): "DDIMER" in the last 72 hours. Hemoglobin A1C: No results for input(s): "HGBA1C" in the last 72 hours. Fasting Lipid Panel: No results for input(s): "CHOL", "HDL", "LDLCALC", "TRIG", "CHOLHDL", "LDLDIRECT" in the last 72 hours. Thyroid Function Tests: No results for input(s): "TSH", "T4TOTAL", "T3FREE", "THYROIDAB" in the last 72 hours.  Invalid input(s): "FREET3" Anemia Panel: Recent Labs    08/07/22 0604 08/07/22 0918  VITAMINB12  --  1,270*  FOLATE 19.7  --   FERRITIN 98  --   TIBC 367  --   IRON 71  --   RETICCTPCT  --  1.8     PHYSICAL EXAM General: Well developed, well nourished, in no acute distress HEENT:  Normocephalic and atramatic Neck:  No JVD.  Lungs: Clear  bilaterally to auscultation and percussion. Heart: HRRR . Normal S1 and S2 without gallops or murmurs.  Abdomen: Bowel sounds are positive, abdomen soft and non-tender  Msk:  Back normal, normal gait. Normal strength and tone for age. Extremities: No clubbing, cyanosis or edema.   Neuro: Alert and oriented X 3. Psych:  Good affect, responds appropriately  TELEMETRY: sinus rhythm, HR 69 bpm  ASSESSMENT AND PLAN: Patient presented to ED on 08/05/22 with NSTEMI. Patient underwent cardiac cath on 08/07/22 with intervention. Patient currently asymptomatic. From a cardiac standpoint, patient can be discharged home with a close follow up with [facility] cardiology.   Principal Problem:   NSTEMI (non-ST elevated myocardial infarction) ([place]) Active Problems:   Obesity (BMI 30-39.9)   DM2 (diabetes mellitus, type 2) ([facility]CC)   HTN (hypertension)   CKD (chronic kidney disease), stage V ([facility]CC)   Anemia due to chronic kidney disease    Shirlyn Savin, FNP-C 08/09/2022 2:16 PM

## 2022-08-10 ENCOUNTER — Encounter: Payer: Self-pay | Admitting: Internal Medicine

## 2022-08-10 LAB — CBC
HCT: 26 % — ABNORMAL LOW (ref 39.0–52.0)
Hemoglobin: 8.2 g/dL — ABNORMAL LOW (ref 13.0–17.0)
MCH: 26.6 pg (ref 26.0–34.0)
MCHC: 31.5 g/dL (ref 30.0–36.0)
MCV: 84.4 fL (ref 80.0–100.0)
Platelets: 172 10*3/uL (ref 150–400)
RBC: 3.08 MIL/uL — ABNORMAL LOW (ref 4.22–5.81)
RDW: 14.7 % (ref 11.5–15.5)
WBC: 7.9 10*3/uL (ref 4.0–10.5)
nRBC: 0 % (ref 0.0–0.2)

## 2022-08-10 LAB — BASIC METABOLIC PANEL
Anion gap: 10 (ref 5–15)
BUN: 63 mg/dL — ABNORMAL HIGH (ref 8–23)
CO2: 22 mmol/L (ref 22–32)
Calcium: 8.9 mg/dL (ref 8.9–10.3)
Chloride: 104 mmol/L (ref 98–111)
Creatinine, Ser: 5.34 mg/dL — ABNORMAL HIGH (ref 0.61–1.24)
GFR, Estimated: 11 mL/min — ABNORMAL LOW (ref >60)
Glucose, Bld: 174 mg/dL — ABNORMAL HIGH (ref 70–99)
Potassium: 3.9 mmol/L (ref 3.5–5.1)
Sodium: 136 mmol/L (ref 135–145)

## 2022-08-10 LAB — GLUCOSE, CAPILLARY
Glucose-Capillary: 118 mg/dL — ABNORMAL HIGH (ref 70–99)
Glucose-Capillary: 163 mg/dL — ABNORMAL HIGH (ref 70–99)
Glucose-Capillary: 115 mg/dL — ABNORMAL HIGH (ref 70–99)

## 2022-08-10 MED ORDER — CARVEDILOL 3.125 MG PO TABS: 3.1250 mg | ORAL_TABLET | Freq: Two times a day (BID) | ORAL | 1 refills | Status: AC

## 2022-08-10 MED ORDER — HEPARIN SODIUM (PORCINE) 1000 UNIT/ML IJ SOLN
INTRAMUSCULAR | Status: AC
  Filled 2022-08-10: qty 10

## 2022-08-10 MED ORDER — CLOPIDOGREL BISULFATE 75 MG PO TABS: 75.0000 mg | ORAL_TABLET | Freq: Every day | ORAL | 1 refills | Status: AC

## 2022-08-10 MED ORDER — CLOPIDOGREL BISULFATE 75 MG PO TABS
75.0000 mg | ORAL_TABLET | Freq: Every day | ORAL | Status: DC
  Administered 2022-08-10: 75 mg via ORAL
  Filled 2022-08-10: qty 1

## 2022-08-10 MED ORDER — FERROUS GLUCONATE 324 (38 FE) MG PO TABS: 324.0000 mg | ORAL_TABLET | Freq: Every day | ORAL | 3 refills | Status: AC

## 2022-08-10 MED ORDER — DOXAZOSIN MESYLATE 4 MG PO TABS: 4.0000 mg | ORAL_TABLET | Freq: Every day | ORAL | 0 refills | Status: AC

## 2022-08-10 MED ORDER — INFLUENZA VAC A&B SA ADJ QUAD 0.5 ML IM PRSY: 0.5000 mL | PREFILLED_SYRINGE | INTRAMUSCULAR | Status: DC

## 2022-08-10 MED ORDER — PNEUMOCOCCAL 20-VAL CONJ VACC 0.5 ML IM SUSY: 0.5000 mL | PREFILLED_SYRINGE | INTRAMUSCULAR | Status: DC

## 2022-08-10 MED ORDER — ROPINIROLE HCL 0.5 MG PO TABS: 0.5000 mg | ORAL_TABLET | Freq: Every day | ORAL | 1 refills | Status: AC

## 2022-08-10 NOTE — Discharge Summary (Signed)
Physician Discharge Summary   Patient: Lee Bryant. MRN: [id number] DOB: 29-Oct-1955  Admit date:     08/05/2022  Discharge date: 08/10/22  Discharge Physician: Lorella Nimrod   PCP: Tracie Harrier, MD   Recommendations at discharge:  Please obtain renal function within next few days Follow-up with nephrology Follow-up with cardiology  Discharge Diagnoses: Principal Problem:   NSTEMI (non-ST elevated myocardial infarction) ([place]) Active Problems:   CKD (chronic kidney disease), stage V ([place])   DM2 (diabetes mellitus, type 2) ([place])   HTN (hypertension)   Obesity (BMI 30-39.9)   Anemia due to chronic kidney disease   Hospital Course: Taken from H&P.  Lee Bryant is followed for CKD V by [facility], HTN, HLD, obesity, DM2, renal related anemia. ON the morning of admission when walking to his care he had an episode of crushing chest pain with radiation of pain to the neck and acute dyspnea. He rested and his symptoms abated. He proceeded to see his PCP for acute chest pain. He was referred to [facility] for further evaluation with a high risk profile for ACS.    ED Course: afebrile. 155/66  HR 81  RR 16 BMI 36.4. Patient was in no distress at admission. Lab: glucose 123  BUN 76, Cr 5.02, Hgb 8.3 (chronic). CXR- NAD, EKG NSR, w/o ST elevation. Troponin #1 459, #2 383.  Patient was started on heparin infusion and cardiology was consulted.  10/19: Patient was experiencing intermittent chest pain with exertion, relieved with rest, gradually worsening.  Radiating to left jaw and sometimes her left arm and associated with shortness of breath which is very typical for ischemic pain.  Patient follow-up with his cardiologist and they are recommending further ischemic work-up with cardiac cath which is being delayed due to CKD stage V.  Message sent to nephrology as patient is recently being worked up for dialysis.  Patient might need to start dialysis for cardiac catheterization.   Cardiology to coordinate with nephrology before planning cardiac catheterization.  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  70 to 90% stenosis of circumflex . No stent was placed today.  Cardiology is currently recommending medical management and reattempt for circumflex stent placement at a later time. Patient received high doses of contrast due to difficult case and most likely will need dialysis after this procedure.  Hemoglobin dropped to 6.9 this morning.  No obvious bleeding.  It was 7.1 yesterday. 2 unit of PRBC ordered for concern of recent ACS.  10/21: Hemodynamically stable.  Started on dialysis after the cardiac catheterization yesterday. Anemia panel with anemia of chronic disease.  Received 1 unit of PRBC instead of 2. Hemoglobin today at 8.7.  Received second dialysis today.    10/22: Patient remained stable and tolerated 2 treatments of dialysis well.  Third treatment tomorrow.  Will need outpatient established dialysis chair versus set up of home dialysis before discharge.  No chest pain.  Stable for discharge from cardiac standpoint with outpatient follow-up for further ischemic management.  Most likely will need bypass versus PCI.  10/23: Patient remained medically stable.  Received third treatment of dialysis.  Nephrology is recommending discharge and they will follow-up closely as an outpatient for setting up home dialysis.  Patient need a PD catheter placed which will be done as an outpatient.  Patient was started on few new medications by his cardiologist and need to have a close  follow-up with his providers for further recommendations.  Assessment and Plan: * NSTEMI (non-ST elevated myocardial infarction) ([place]) Patient with multiple risk factors: DM, HTN, Obesity, CKD V with chronic anemia. Patient reports he had a nuclear stress test 1-2 years ago revealing CAD  which has been managed medically. Over the last month he has had progressive DOE and reports 2-3 episodes of exertional chest pain that resolved with rest.  On the morning of admission he had another episode of crushing chest pain with radiation to the jaw and neck with acute dyspnea that was more severe than previous episodes consistent with unstable angina. He went to his PCP who referred him to [facility] for evaluation. EKG w/o acute STEMI; troponin #1 459, #2 383.   Cardiology was consulted-concern of angina and ACS. Patient underwent cardiac catheterization which shows multivessel disease, no intervention was done , cardiology is recommending medical management and reattempt for stent placement at a later time. -Continue with heparin infusion-cardiology will determine the duration. -Continue with aspirin, Imdur, and statin  CKD (chronic kidney disease), stage V ([facility]) Significant improvement in BUN and creatinine after getting 1 session of dialysis yesterday.  Going for second session today.  Most likely ESRD now. -Nephrology is following  DM2 (diabetes mellitus, type 2) ([place]) Seems well controlled with A1c of 5.5.  Patient was using only Amaryl at home. -Continue with basal and SSI  HTN (hypertension) Long standing problem. Currently  on ARB, CCB, peripheral dilator.  Plan Continue outpatient meds  Add beta-blocker    Obesity (BMI 30-39.9) Estimated body mass index is 36.41 kg/m as calculated from the following:   Height as of this encounter: 6\' 1"  (1.854 m).   Weight as of this encounter: 125.2 kg.   -This will complicate overall prognosis -Might get benefit by adding outpatient Ozempic  Anemia due to chronic kidney disease Hemoglobin dropped to 6.9 this morning.  No obvious bleeding but patient is on heparin infusion.  Most likely secondary to CKD stage V. -Ordered 2 unit of PRBC to keep hemoglobin above 8 for recent ACS. -Anemia panel -Continue to monitor -Transfuse if below  8   Consultants: Cardiology, nephrology Procedures performed: Hemodialysis, cardiac catheterization Disposition: Home Diet recommendation:  Discharge Diet Orders (From admission, onward)     Start     Ordered   08/10/22 0000  Diet - low sodium heart healthy        08/10/22 1525           Cardiac and Carb modified diet DISCHARGE MEDICATION: Allergies as of 08/10/2022       Reactions   Cephalexin Rash        Medication List     STOP taking these medications    amoxicillin-clavulanate 875-125 MG tablet Commonly known as: AUGMENTIN   furosemide 40 MG tablet Commonly known as: LASIX   gabapentin 300 MG capsule Commonly known as: NEURONTIN   HYDROcodone-acetaminophen 5-325 MG tablet Commonly known as: NORCO/VICODIN   ibuprofen 800 MG tablet Commonly known as: ADVIL   metroNIDAZOLE 500 MG tablet Commonly known as: FLAGYL       TAKE these medications    amLODipine 5 MG tablet Commonly known as: NORVASC Take 5 mg by mouth daily. What changed: Another medication with the same name was removed. Continue taking this medication, and follow the directions you see here.   aspirin EC 81 MG tablet Take 81 mg by mouth daily.   carvedilol 3.125 MG tablet Commonly known as: COREG Take 1 tablet (  3.125 mg total) by mouth 2 (two) times daily with a meal.   cloNIDine 0.3 MG tablet Commonly known as: CATAPRES Take 0.3 mg by mouth 2 (two) times daily.   clopidogrel 75 MG tablet Commonly known as: PLAVIX Take 1 tablet (75 mg total) by mouth daily. Start taking on: August 11, 2022   collagenase 250 UNIT/GM ointment Commonly known as: SANTYL Apply topically.   cyclobenzaprine 10 MG tablet Commonly known as: FLEXERIL   doxazosin 4 MG tablet Commonly known as: CARDURA Take 1 tablet (4 mg total) by mouth at bedtime.   DULoxetine 30 MG capsule Commonly known as: CYMBALTA Take 30 mg by mouth daily.   fenofibrate 48 MG tablet Commonly known as: TRICOR Take  48 mg by mouth daily.   ferrous gluconate 324 MG tablet Commonly known as: FERGON Take 1 tablet (324 mg total) by mouth daily with breakfast. Start taking on: August 11, 2022   glimepiride 4 MG tablet Commonly known as: AMARYL Take 4 mg by mouth 2 (two) times daily.   hydrALAZINE 25 MG tablet Commonly known as: APRESOLINE Take 25 mg by mouth daily.   isosorbide mononitrate 30 MG 24 hr tablet Commonly known as: IMDUR Take 30 mg by mouth daily.   lidocaine 2 % jelly Commonly known as: XYLOCAINE   metFORMIN 1000 MG tablet Commonly known as: GLUCOPHAGE Take 1,000 mg by mouth 2 (two) times daily.   oxyCODONE-acetaminophen 5-325 MG tablet Commonly known as: PERCOCET/ROXICET Take 1 tablet by mouth 2 (two) times daily as needed.   rOPINIRole 0.5 MG tablet Commonly known as: REQUIP Take 1 tablet (0.5 mg total) by mouth at bedtime.   rosuvastatin 10 MG tablet Commonly known as: CRESTOR Take 10 mg by mouth daily.   valsartan-hydrochlorothiazide 320-12.5 MG tablet Commonly known as: DIOVAN-HCT TAKE ONE (1) TABLET EACH DAY        Follow-up Information     Callwood, Dwayne D, MD. Go in 1 week(s).   Specialties: Cardiology, Internal Medicine Contact information: [place] [address] [phone]         Tracie Harrier, MD. Schedule an appointment as soon as possible for a visit in 1 week(s).   Specialty: Internal Medicine Contact information: [address] [place] [address] [phone]         Murlean Iba, MD. Schedule an appointment as soon as possible for a visit in 1 week(s).   Specialty: Nephrology Contact information: [place] [address] [phone]                Discharge Exam: Danley Danker Weights   08/08/22 1128 08/09/22 1048 08/10/22 1505  Weight: 120.1 kg 119.1 kg 116.4 kg   General.  Obese gentleman, in no acute distress. Pulmonary.  Lungs  clear bilaterally, normal respiratory effort. CV.  Regular rate and rhythm, no JVD, rub or murmur. Abdomen.  Soft, nontender, nondistended, BS positive. CNS.  Alert and oriented .  No focal neurologic deficit. Extremities.  No edema, no cyanosis, pulses intact and symmetrical. Psychiatry.  Judgment and insight appears normal.   Condition at discharge: stable  The results of significant diagnostics from this hospitalization (including imaging, microbiology, ancillary and laboratory) are listed below for reference.   Imaging Studies: PERIPHERAL VASCULAR CATHETERIZATION  Result Date: 08/07/2022 See surgical note for result.  CARDIAC CATHETERIZATION  Result Date: 08/07/2022   Ost RCA to Prox RCA lesion is 100% stenosed.   Mid Cx lesion is 90%  stenosed.   Dist Cx lesion is 75% stenosed.   Ramus lesion is 50% stenosed.   Mid LAD lesion is 50% stenosed.   Dist RCA lesion is 100% stenosed.   There is moderate left ventricular systolic dysfunction.   LV end diastolic pressure is mildly elevated.   The left ventricular ejection fraction is 35-45% by visual estimate. Conclusion Successful left heart cath right radial approach Mild to moderate depressed left ventricular function inferior hypokinesis ejection fraction of around 40% with left ventricular enlargement Coronaries Left main large minor irregularities LAD large 50% mid otherwise minor irregularities Circumflex large 90% mid 75% distal Ramus large diffuse 50% Mixed dominant system RCA large 100% occluded proximally TIMI 0 flow CTO moderate to severe calcification Intervention deferred Recommend medical therapy Recommend dialysis today Contrast load was high at 280 cc because of difficulty with the case Consider intervention of circumflex in the future RCA is not amenable to percutaneous intervention  ECHOCARDIOGRAM COMPLETE  Result Date: 08/06/2022    ECHOCARDIOGRAM REPORT   Patient Name:   Lee Bryant. Date of Exam: 08/06/2022 Medical  Rec #:  [id number]             Height:       73.0 in Accession #:    [id number]            Weight:       276.0 lb Date of Birth:  1956-03-28            BSA:          2.467 m Patient Age:    81 years              BP:           147/65 mmHg Patient Gender: M                     HR:           85 bpm. Exam Location:  [facility] Procedure: 2D Echo, Color Doppler, Cardiac Doppler and Intracardiac            Opacification Agent Indications:     I21.4 NSTEMI  History:         Patient has prior history of Echocardiogram examinations, most                  recent 03/14/2017. CAD, CKD; Risk Factors:Hypertension, Diabetes                  and Dyslipidemia.  Sonographer:     Charmayne Sheer Referring Phys:  [place] Diagnosing Phys: Yolonda Kida MD  Sonographer Comments: Image acquisition challenging due to patient body habitus. IMPRESSIONS  1. Left ventricular ejection fraction, by estimation, is 50 to 55%. The left ventricle has low normal function. The left ventricle demonstrates global hypokinesis. The left ventricular internal cavity size was moderately to severely dilated. There is moderate concentric left ventricular hypertrophy. Left ventricular diastolic parameters are consistent with Grade II diastolic dysfunction (pseudonormalization).  2. Right ventricular systolic function is normal. The right ventricular size is normal.  3. Left atrial size was moderately dilated.  4. Right atrial size was mild to moderately dilated.  5. The mitral valve is grossly normal. Mild mitral valve regurgitation.  6. The aortic valve is calcified. Aortic valve regurgitation is trivial. Aortic valve sclerosis/calcification is present, without any evidence of aortic stenosis.  7. Aortic dilatation noted. There is moderate dilatation of the aortic root.  FINDINGS  Left Ventricle: Left ventricular ejection fraction, by estimation, is 50 to 55%. The left ventricle has low normal function. The left ventricle demonstrates global hypokinesis.  Definity contrast agent was given IV to delineate the left ventricular endocardial borders. The left ventricular internal cavity size was moderately to severely dilated. There is moderate concentric left ventricular hypertrophy. Left ventricular diastolic parameters are consistent with Grade II diastolic dysfunction (pseudonormalization). Right Ventricle: The right ventricular size is normal. No increase in right ventricular wall thickness. Right ventricular systolic function is normal. Left Atrium: Left atrial size was moderately dilated. Right Atrium: Right atrial size was mild to moderately dilated. Pericardium: There is no evidence of pericardial effusion. Mitral Valve: The mitral valve is grossly normal. Mild mitral valve regurgitation. Tricuspid Valve: The tricuspid valve is normal in structure. Tricuspid valve regurgitation is mild. Aortic Valve: The aortic valve is calcified. Aortic valve regurgitation is trivial. Aortic valve sclerosis/calcification is present, without any evidence of aortic stenosis. Aortic valve mean gradient measures 7.0 mmHg. Aortic valve peak gradient measures 13.5 mmHg. Aortic valve area, by VTI measures 2.69 cm. Pulmonic Valve: The pulmonic valve was grossly normal. Pulmonic valve regurgitation is not visualized. Aorta: The ascending aorta was not well visualized and aortic dilatation noted. There is moderate dilatation of the aortic root. IAS/Shunts: No atrial level shunt detected by color flow Doppler.  LEFT VENTRICLE PLAX 2D LVIDd:         5.60 cm      Diastology LVIDs:         4.20 cm      LV e' medial:    6.09 cm/s LV PW:         1.70 cm      LV E/e' medial:  22.8 LV IVS:        1.50 cm      LV e' lateral:   12.10 cm/s LVOT diam:     2.30 cm      LV E/e' lateral: 11.5 LV SV:         81 LV SV Index:   33 LVOT Area:     4.15 cm  LV Volumes (MOD) LV vol d, MOD A2C: 166.0 ml LV vol d, MOD A4C: 218.0 ml LV vol s, MOD A2C: 63.5 ml LV vol s, MOD A4C: 80.1 ml LV SV MOD A2C:     102.5  ml LV SV MOD A4C:     218.0 ml LV SV MOD BP:      115.0 ml RIGHT VENTRICLE RV Basal diam:  4.30 cm RV S prime:     19.10 cm/s LEFT ATRIUM              Index        RIGHT ATRIUM           Index LA diam:        5.20 cm  2.11 cm/m   RA Area:     14.70 cm LA Vol (A2C):   103.0 ml 41.75 ml/m  RA Volume:   33.70 ml  13.66 ml/m LA Vol (A4C):   84.3 ml  34.17 ml/m LA Biplane Vol: 95.8 ml  38.83 ml/m  AORTIC VALVE                     PULMONIC VALVE AV Area (Vmax):    2.53 cm      PV Vmax:       1.25 m/s AV Area (Vmean):   2.49 cm  PV Vmean:      85.200 cm/s AV Area (VTI):     2.69 cm      PV VTI:        0.246 m AV Vmax:           184.00 cm/s   PV Peak grad:  6.2 mmHg AV Vmean:          125.000 cm/s  PV Mean grad:  3.0 mmHg AV VTI:            0.301 m AV Peak Grad:      13.5 mmHg AV Mean Grad:      7.0 mmHg LVOT Vmax:         112.00 cm/s LVOT Vmean:        74.800 cm/s LVOT VTI:          0.195 m LVOT/AV VTI ratio: 0.65  AORTA Ao Root diam: 4.60 cm MITRAL VALVE MV Area (PHT): 4.49 cm     SHUNTS MV Decel Time: 169 msec     Systemic VTI:  0.20 m MV E velocity: 139.00 cm/s  Systemic Diam: 2.30 cm MV A velocity: 88.90 cm/s MV E/A ratio:  1.56 Dwayne Prince [place] MD Electronically signed by Yolonda Kida MD Signature Date/Time: 08/06/2022/5:14:00 PM    Final    DG Chest 2 View  Result Date: 08/05/2022 CLINICAL DATA:  Chest pain EXAM: CHEST - 2 VIEW COMPARISON:  None Available. FINDINGS: Mild cardiac enlargement with normal pulmonary vascularity. Lungs clear without infiltrate or effusion Advanced degenerative change in both shoulders. IMPRESSION: No active cardiopulmonary disease. Electronically Signed   By: Franchot Gallo M.D.   On: 08/05/2022 16:23    Microbiology: Results for orders placed or performed during the hospital encounter of 03/12/17  MRSA PCR Screening     Status: None   Collection Time: 03/12/17  1:26 PM   Specimen: Nasal Mucosa; Nasopharyngeal  Result Value Ref Range Status   MRSA by PCR  NEGATIVE NEGATIVE Final    Comment:        The GeneXpert MRSA Assay (FDA approved for NASAL specimens only), is one component of a comprehensive MRSA colonization surveillance program. It is not intended to diagnose MRSA infection nor to guide or monitor treatment for MRSA infections.   Aerobic Culture  (superficial specimen)     Status: None   Collection Time: 03/12/17  6:00 PM   Specimen: Ankle; Wound  Result Value Ref Range Status   Specimen Description ANKLE RIGHT ANKLE AND BACK OF ANKLE ULCERS  Final   Special Requests Normal  Final   Gram Stain   Final    MODERATE WBC PRESENT, PREDOMINANTLY PMN ABUNDANT GRAM NEGATIVE RODS MODERATE GRAM POSITIVE COCCI IN PAIRS IN CLUSTERS FEW GRAM POSITIVE RODS    Culture   Final    ABUNDANT STAPHYLOCOCCUS AUREUS WITHIN MIXED CULTURE Performed at [facility], [place] [address], [place], [place] [address]    Report Status 03/15/2017 FINAL  Final   Organism ID, Bacteria STAPHYLOCOCCUS AUREUS  Final      Susceptibility   Staphylococcus aureus - MIC*    CIPROFLOXACIN >=8 RESISTANT Resistant     ERYTHROMYCIN >=8 RESISTANT Resistant     GENTAMICIN <=0.5 SENSITIVE Sensitive     OXACILLIN 0.5 SENSITIVE Sensitive     TETRACYCLINE <=1 SENSITIVE Sensitive     VANCOMYCIN 1 SENSITIVE Sensitive     TRIMETH/SULFA <=10 SENSITIVE Sensitive     CLINDAMYCIN <=0.25 SENSITIVE Sensitive  RIFAMPIN <=0.5 SENSITIVE Sensitive     Inducible Clindamycin NEGATIVE Sensitive     * ABUNDANT STAPHYLOCOCCUS AUREUS    Labs: CBC: Recent Labs  Lab 08/06/22 0303 08/07/22 0604 08/07/22 2326 08/08/22 1111 08/10/22 1023  WBC 7.1 7.1 7.6 7.2 7.9  HGB 7.1* 6.9* 8.1* 8.7* 8.2*  HCT 23.2* 23.1* 25.2* 26.7* 26.0*  MCV 86.9 88.5 84.6 83.7 84.4  PLT 216 203 205 204 338   Basic Metabolic Panel: Recent Labs  Lab 08/05/22 1545 08/06/22 0303 08/07/22 0604 08/08/22 1111 08/10/22 1023  NA 135 137 136 135 136  K 4.3 4.1 4.4 3.5 3.9  CL 106 110 109 101 104   CO2 21* 19* 23 27 22   GLUCOSE 123* 165* 113* 100* 174*  BUN 76* 69* 67* 26* 63*  CREATININE 5.02* 4.95* 4.89* 2.47* 5.34*  CALCIUM 9.4 8.9 8.7* 8.9 8.9  PHOS  --   --   --  2.1*  --    Liver Function Tests: Recent Labs  Lab 08/08/22 1111  ALBUMIN 3.9   CBG: Recent Labs  Lab 08/09/22 1125 08/09/22 1619 08/09/22 2121 08/10/22 0714 08/10/22 1244  GLUCAP 168* 98 165* 118* 163*    Discharge time spent: greater than 30 minutes.  This record has been created using Systems analyst. Errors have been sought and corrected,but may not always be located. Such creation errors do not reflect on the standard of care.   Signed: Lorella Nimrod, MD [facility] 08/10/2022

## 2022-08-10 NOTE — Progress Notes (Signed)
Aware that patient will require outpatient hemodialysis placement.

## 2022-08-10 NOTE — Progress Notes (Addendum)
[place] NOTE       Patient ID: Lee Bryant. MRN: [id number] DOB/AGE: 07-01-1956 66 y.o.  Admit date: 08/05/2022 Referring Physician Dr. Lorella Nimrod Primary Physician Dr. Ginette Pitman Primary Cardiologist Dr. Ubaldo Glassing, seen for 2 visits, most recently in 2021  reason for Consultation NSTEMI  HPI: Lee Bryant. Lee Bryant. is a 66 year old male with a PMH of abnormal Lexiscan Myoview 05/2020 with moderate area of inferior hypoperfusion and EF 40% (medically managed), CKD 5 (Cr 4.8-5.5, GFR 11 Dr. Murlean Iba, referred for [facility] transplant center for evaluation 06/2022) secondary to DM 2 (A1c 6.1% 02/2022) anemia of chronic disease, essential hypertension who presented to [facility] ED 08/05/2022 with worsening dyspnea on exertion and increased frequency and severity of his chronic angina. Cardiology is consulted for further assistance.  Interval history: -Remains chest pain-free since admission, eager to get his PD dialysis catheter placed -Denies shortness of breath or other complaints, wants to go home.  Review of systems complete and found to be negative unless listed above     Past Medical History:  Diagnosis Date   CAD (coronary artery disease)    had abnormal nuclear stress test 2021 or 2022   Cellulitis    CKD (chronic kidney disease), stage V (HCC)    Decubitus ulcer of sacral region, stage 4 ([place])    resolved - required multiple surgical interventions   Diabetes mellitus ([place])    type II   HLD (hyperlipidemia)    Hypertension    Obesity (BMI 30-39.9)     Past Surgical History:  Procedure Laterality Date   ducubitus ulcer debridement N/A    LEFT HEART CATH AND CORONARY ANGIOGRAPHY N/A 08/07/2022   Procedure: LEFT HEART CATH AND CORONARY ANGIOGRAPHY;  Surgeon: Yolonda Kida, MD;  Location: [place] [facility];  Service: Cardiovascular;  Laterality: N/A;   TEMPORARY DIALYSIS CATHETER Right 08/07/2022   Procedure: TEMPORARY DIALYSIS CATHETER;   Surgeon: Algernon Huxley, MD;  Location: [place] [facility];  Service: Cardiovascular;  Laterality: Right;    Medications Prior to Admission  Medication Sig Dispense Refill Last Dose   amLODipine (NORVASC) 2.5 MG tablet Take 2.5 mg by mouth daily.   08/07/2022   amLODipine (NORVASC) 5 MG tablet Take 5 mg by mouth daily.   08/07/2022   aspirin EC 81 MG tablet Take 81 mg by mouth daily.   08/07/2022   cloNIDine (CATAPRES) 0.3 MG tablet Take 0.3 mg by mouth 2 (two) times daily.   08/07/2022   DULoxetine (CYMBALTA) 30 MG capsule Take 30 mg by mouth daily.   08/07/2022   fenofibrate (TRICOR) 48 MG tablet Take 48 mg by mouth daily.   08/07/2022   furosemide (LASIX) 40 MG tablet Take 1 tablet (40 mg total) by mouth daily. 30 tablet 0 08/07/2022   gabapentin (NEURONTIN) 300 MG capsule    08/07/2022   gabapentin (NEURONTIN) 300 MG capsule Take 2 capsules (600 mg total) by mouth 3 (three) times daily. 180 capsule 1 08/05/2022   glimepiride (AMARYL) 4 MG tablet Take 4 mg by mouth 2 (two) times daily.   08/07/2022   hydrALAZINE (APRESOLINE) 25 MG tablet Take 25 mg by mouth daily.   08/07/2022   HYDROcodone-acetaminophen (NORCO/VICODIN) 5-325 MG tablet Take 1 tablet by mouth daily as needed. 30 tablet 0 08/04/2022   isosorbide mononitrate (IMDUR) 30 MG 24 hr tablet Take 30 mg by mouth daily.   08/05/2022   metroNIDAZOLE (FLAGYL) 500 MG tablet Take 500 mg by  mouth 3 (three) times daily.   08/05/2022   rosuvastatin (CRESTOR) 10 MG tablet Take 10 mg by mouth daily.   08/05/2022   valsartan-hydrochlorothiazide (DIOVAN-HCT) 320-12.5 MG tablet TAKE ONE (1) TABLET EACH DAY   08/05/2022   amoxicillin-clavulanate (AUGMENTIN) 875-125 MG tablet Take 1 tablet by mouth every 12 (twelve) hours. (Patient not taking: Reported on 08/06/2022) 20 tablet 0 Completed Course   collagenase (SANTYL) ointment Apply topically.      cyclobenzaprine (FLEXERIL) 10 MG tablet    prn   ibuprofen (ADVIL,MOTRIN) 800 MG tablet Take 800 mg by  mouth daily as needed. (Patient not taking: Reported on 08/06/2022)   Not Taking   lidocaine (XYLOCAINE) 2 % jelly       metFORMIN (GLUCOPHAGE) 1000 MG tablet Take 1,000 mg by mouth 2 (two) times daily.      oxyCODONE-acetaminophen (PERCOCET/ROXICET) 5-325 MG tablet Take 1 tablet by mouth 2 (two) times daily as needed.   08/04/2022 at prn    Social History   Socioeconomic History   Marital status: Married    Spouse name: Not on file   Number of children: Not on file   Years of education: Not on file   Highest education level: Not on file  Occupational History   Not on file  Tobacco Use   Smoking status: Former    Types: Cigarettes   Smokeless tobacco: Never   Tobacco comments:    when he was 66yrs old.  Vaping Use   Vaping Use: Never used  Substance and Sexual Activity   Alcohol use: Yes    Comment: occassionally.   Drug use: Yes    Types: Marijuana   Sexual activity: Yes    Partners: Female    Birth control/protection: None  Other Topics Concern   Not on file  Social History Narrative   Not on file   Social Determinants of Health   Financial Resource Strain: Not on file  Food Insecurity: No Food Insecurity (08/06/2022)   Hunger Vital Sign    Worried About Running Out of Food in the Last Year: Never true    Ran Out of Food in the Last Year: Never true  Transportation Needs: No Transportation Needs (08/06/2022)   PRAPARE - Hydrologist (Medical): No    Lack of Transportation (Non-Medical): No  Physical Activity: Not on file  Stress: Not on file  Social Connections: Not on file  Intimate Partner Violence: Not At Risk (08/06/2022)   Humiliation, Afraid, Rape, and Kick questionnaire    Fear of Current or Ex-Partner: No    Emotionally Abused: No    Physically Abused: No    Sexually Abused: No    Family History  Problem Relation Age of Onset   Cancer Mother    Hypertension Mother    Heart attack Father    Cancer Father    Stroke  Sister    Diabetes Brother    Kidney failure Brother      Vitals:   08/09/22 2143 08/10/22 0011 08/10/22 0426 08/10/22 0712  BP: (!) 146/69 (!) 109/53 122/61 130/65  Pulse: 69 65 60 68  Resp: 18 16 16 14   Temp: 99 F (37.2 C) 98 F (36.7 C) 97.9 F (36.6 C) 98.2 F (36.8 C)  TempSrc: Oral Oral Oral Oral  SpO2: 95% 93% 97% 100%  Weight:      Height:        PHYSICAL EXAM General: Pleasant conversational Caucasian male, well nourished, in  no acute distress.  Sitting at incline in hospital bed HEENT:  Normocephalic and atraumatic. Neck:  No JVD.  Lungs: Normal respiratory effort on room air. Clear bilaterally to auscultation. No wheezes, crackles, rhonchi.  Heart: HRRR . Normal S1 and S2 without gallops or murmurs.  Abdomen: Non-distended appearing with excess adiposity.  Msk: Normal strength and tone for age. Extremities: Warm and well perfused. No clubbing, cyanosis.  No peripheral edema.  Neuro: Alert and oriented X 3. Psych:  Answers questions appropriately.   Labs: Basic Metabolic Panel: Recent Labs    08/08/22 1111  NA 135  K 3.5  CL 101  CO2 27  GLUCOSE 100*  BUN 26*  CREATININE 2.47*  CALCIUM 8.9  PHOS 2.1*    Liver Function Tests: Recent Labs    08/08/22 1111  ALBUMIN 3.9   No results for input(s): "LIPASE", "AMYLASE" in the last 72 hours. CBC: Recent Labs    08/07/22 2326 08/08/22 1111  WBC 7.6 7.2  HGB 8.1* 8.7*  HCT 25.2* 26.7*  MCV 84.6 83.7  PLT 205 204    Cardiac Enzymes: No results for input(s): "CKTOTAL", "CKMB", "CKMBINDEX", "TROPONINIHS" in the last 72 hours.  BNP: No results for input(s): "BNP" in the last 72 hours. D-Dimer: No results for input(s): "DDIMER" in the last 72 hours. Hemoglobin A1C: No results for input(s): "HGBA1C" in the last 72 hours.  Fasting Lipid Panel: No results for input(s): "CHOL", "HDL", "LDLCALC", "TRIG", "CHOLHDL", "LDLDIRECT" in the last 72 hours.  Thyroid Function Tests: No results for  input(s): "TSH", "T4TOTAL", "T3FREE", "THYROIDAB" in the last 72 hours.  Invalid input(s): "FREET3" Anemia Panel: No results for input(s): "VITAMINB12", "FOLATE", "FERRITIN", "TIBC", "IRON", "RETICCTPCT" in the last 72 hours.   Radiology: PERIPHERAL VASCULAR CATHETERIZATION  Result Date: 08/07/2022 See surgical note for result.  CARDIAC CATHETERIZATION  Result Date: 08/07/2022   Ost RCA to Prox RCA lesion is 100% stenosed.   Mid Cx lesion is 90% stenosed.   Dist Cx lesion is 75% stenosed.   Ramus lesion is 50% stenosed.   Mid LAD lesion is 50% stenosed.   Dist RCA lesion is 100% stenosed.   There is moderate left ventricular systolic dysfunction.   LV end diastolic pressure is mildly elevated.   The left ventricular ejection fraction is 35-45% by visual estimate. Conclusion Successful left heart cath right radial approach Mild to moderate depressed left ventricular function inferior hypokinesis ejection fraction of around 40% with left ventricular enlargement Coronaries Left main large minor irregularities LAD large 50% mid otherwise minor irregularities Circumflex large 90% mid 75% distal Ramus large diffuse 50% Mixed dominant system RCA large 100% occluded proximally TIMI 0 flow CTO moderate to severe calcification Intervention deferred Recommend medical therapy Recommend dialysis today Contrast load was high at 280 cc because of difficulty with the case Consider intervention of circumflex in the future RCA is not amenable to percutaneous intervention  ECHOCARDIOGRAM COMPLETE  Result Date: 08/06/2022    ECHOCARDIOGRAM REPORT   Patient Name:   Asencion Loveday. Date of Exam: 08/06/2022 Medical Rec #:  [id number]             Height:       73.0 in Accession #:    [id number]            Weight:       276.0 lb Date of Birth:  Jan 31, 1956            BSA:  2.467 m Patient Age:    28 years              BP:           147/65 mmHg Patient Gender: M                     HR:           85 bpm. Exam  Location:  [facility] Procedure: 2D Echo, Color Doppler, Cardiac Doppler and Intracardiac            Opacification Agent Indications:     I21.4 NSTEMI  History:         Patient has prior history of Echocardiogram examinations, most                  recent 03/14/2017. CAD, CKD; Risk Factors:Hypertension, Diabetes                  and Dyslipidemia.  Sonographer:     Charmayne Sheer Referring Phys:  [place] Diagnosing Phys: Yolonda Kida MD  Sonographer Comments: Image acquisition challenging due to patient body habitus. IMPRESSIONS  1. Left ventricular ejection fraction, by estimation, is 50 to 55%. The left ventricle has low normal function. The left ventricle demonstrates global hypokinesis. The left ventricular internal cavity size was moderately to severely dilated. There is moderate concentric left ventricular hypertrophy. Left ventricular diastolic parameters are consistent with Grade II diastolic dysfunction (pseudonormalization).  2. Right ventricular systolic function is normal. The right ventricular size is normal.  3. Left atrial size was moderately dilated.  4. Right atrial size was mild to moderately dilated.  5. The mitral valve is grossly normal. Mild mitral valve regurgitation.  6. The aortic valve is calcified. Aortic valve regurgitation is trivial. Aortic valve sclerosis/calcification is present, without any evidence of aortic stenosis.  7. Aortic dilatation noted. There is moderate dilatation of the aortic root. FINDINGS  Left Ventricle: Left ventricular ejection fraction, by estimation, is 50 to 55%. The left ventricle has low normal function. The left ventricle demonstrates global hypokinesis. Definity contrast agent was given IV to delineate the left ventricular endocardial borders. The left ventricular internal cavity size was moderately to severely dilated. There is moderate concentric left ventricular hypertrophy. Left ventricular diastolic parameters are consistent with Grade II  diastolic dysfunction (pseudonormalization). Right Ventricle: The right ventricular size is normal. No increase in right ventricular wall thickness. Right ventricular systolic function is normal. Left Atrium: Left atrial size was moderately dilated. Right Atrium: Right atrial size was mild to moderately dilated. Pericardium: There is no evidence of pericardial effusion. Mitral Valve: The mitral valve is grossly normal. Mild mitral valve regurgitation. Tricuspid Valve: The tricuspid valve is normal in structure. Tricuspid valve regurgitation is mild. Aortic Valve: The aortic valve is calcified. Aortic valve regurgitation is trivial. Aortic valve sclerosis/calcification is present, without any evidence of aortic stenosis. Aortic valve mean gradient measures 7.0 mmHg. Aortic valve peak gradient measures 13.5 mmHg. Aortic valve area, by VTI measures 2.69 cm. Pulmonic Valve: The pulmonic valve was grossly normal. Pulmonic valve regurgitation is not visualized. Aorta: The ascending aorta was not well visualized and aortic dilatation noted. There is moderate dilatation of the aortic root. IAS/Shunts: No atrial level shunt detected by color flow Doppler.  LEFT VENTRICLE PLAX 2D LVIDd:         5.60 cm      Diastology LVIDs:         4.20 cm  LV e' medial:    6.09 cm/s LV PW:         1.70 cm      LV E/e' medial:  22.8 LV IVS:        1.50 cm      LV e' lateral:   12.10 cm/s LVOT diam:     2.30 cm      LV E/e' lateral: 11.5 LV SV:         81 LV SV Index:   33 LVOT Area:     4.15 cm  LV Volumes (MOD) LV vol d, MOD A2C: 166.0 ml LV vol d, MOD A4C: 218.0 ml LV vol s, MOD A2C: 63.5 ml LV vol s, MOD A4C: 80.1 ml LV SV MOD A2C:     102.5 ml LV SV MOD A4C:     218.0 ml LV SV MOD BP:      115.0 ml RIGHT VENTRICLE RV Basal diam:  4.30 cm RV S prime:     19.10 cm/s LEFT ATRIUM              Index        RIGHT ATRIUM           Index LA diam:        5.20 cm  2.11 cm/m   RA Area:     14.70 cm LA Vol (A2C):   103.0 ml 41.75 ml/m  RA  Volume:   33.70 ml  13.66 ml/m LA Vol (A4C):   84.3 ml  34.17 ml/m LA Biplane Vol: 95.8 ml  38.83 ml/m  AORTIC VALVE                     PULMONIC VALVE AV Area (Vmax):    2.53 cm      PV Vmax:       1.25 m/s AV Area (Vmean):   2.49 cm      PV Vmean:      85.200 cm/s AV Area (VTI):     2.69 cm      PV VTI:        0.246 m AV Vmax:           184.00 cm/s   PV Peak grad:  6.2 mmHg AV Vmean:          125.000 cm/s  PV Mean grad:  3.0 mmHg AV VTI:            0.301 m AV Peak Grad:      13.5 mmHg AV Mean Grad:      7.0 mmHg LVOT Vmax:         112.00 cm/s LVOT Vmean:        74.800 cm/s LVOT VTI:          0.195 m LVOT/AV VTI ratio: 0.65  AORTA Ao Root diam: 4.60 cm MITRAL VALVE MV Area (PHT): 4.49 cm     SHUNTS MV Decel Time: 169 msec     Systemic VTI:  0.20 m MV E velocity: 139.00 cm/s  Systemic Diam: 2.30 cm MV A velocity: 88.90 cm/s MV E/A ratio:  1.56 Dwayne Prince Rome MD Electronically signed by Yolonda Kida MD Signature Date/Time: 08/06/2022/5:14:00 PM    Final    DG Chest 2 View  Result Date: 08/05/2022 CLINICAL DATA:  Chest pain EXAM: CHEST - 2 VIEW COMPARISON:  None Available. FINDINGS: Mild cardiac enlargement with normal pulmonary vascularity. Lungs clear without infiltrate or effusion Advanced degenerative change in both  shoulders. IMPRESSION: No active cardiopulmonary disease. Electronically Signed   By: Franchot Gallo M.D.   On: 08/05/2022 16:23    06/03/2020 Lexiscan Myoview Impression   Stress test showed reduced LV function at 40%.  There is moderate area of  moderate hypoperfusion in the inferior wall with partial redistribution.    Will need further follow-up to discuss further treatment options and  studies.  Abnormal stress test. Narrative  CARDIOLOGY DEPARTMENT  [facility]  [facility]  [address], [place], [place]  [address]  [phone]   Procedure: Pharmacologic Myocardial Perfusion Imaging    ONE day procedure   Indication: Chest pain at  rest  Plan: NM myocardial perfusion SPECT multiple (stress        and rest), ECG stress test only   Ordering Physician:   Dr. Bartholome Bill    Clinical History:  66 y.o. year old male  Vitals: Height: 73 in Weight: 248 lb  Cardiac risk factors include:     Hyperlipidemia, Diabetes and HTN    Procedure:   Pharmacologic stress testing was performed with Regadenoson using a single  use 0.4mg /74ml (0.08 mg/ml) prefilled syringe intravenously infused as a  bolus dose over 10-15 seconds. The stress test was stopped due to Infusion  completion.  Blood pressure response was normal. The patient developed  symptoms other than fatigue during the procedure; specific symptoms  included PROLONGED AND PROGESSIVE CHEST PRESSURE WITH VOMITTING.   Rest HR: 63bpm  Rest BP: 130/84mmHg  Max HR: 96bpm  Min BP: 180/35mmHg   Stress Test Administered by: Oswald Hillock, CMA   ECG Interpretation:  Rest ECG:  normal sinus rhythm, none  Stress ECG:  normal sinus rhythm, nonspecific ST-T wave changes  Recovery ECG:  normal sinus rhythm  ECG Interpretation:  non-diagnostic due to pharmacologic testing.    Administrations This Visit     regadenoson (LEXISCAN) 0.4 mg/5 mL inj syringe 0.4 mg     Admin Date  06/03/2020 Action  Given Dose  0.4 mg Route  Intravenous Administered By  Herbert Seta, CNMT     technetium Tc64m sestamibi (CARDIOLITE) injection 76.73 millicurie     Admin Date  06/03/2020 Action  Given Dose  41.93 millicurie Route  Intravenous Administered By  Herbert Seta, CNMT     technetium Tc42m sestamibi (CARDIOLITE) injection 79.02 millicurie     Admin Date  06/03/2020 Action  Given Dose  40.97 millicurie Route  Intravenous Administered By  Herbert Seta, CNMT   Gated post-stress perfusion imaging was performed 30 minutes after stress.  Rest images were performed 30 minutes after injection.   Gated LV Analysis:   Summary of LV Perfusion: AbnormalWall motion the  inferior wall,   Summary of LV Function: Abnormal    TID Ratio: 1.08   LVEF= 40%   FINDINGS:  Regional wall motion:  demonstrates  hypokinesis of the Inferior wall.  The overall quality of the study is good.    Artifacts noted: no  Left ventricular cavity: normal.   ECHO 06/03/2020 ECHOCARDIOGRAPHIC MEASUREMENTS  2D DIMENSIONS  AORTA                  Values   Normal Range   MAIN PA         Values    Normal Range                Annulus: nm*          [2.3-2.9]  PA Main: nm*       [1.5-2.1]              Aorta Sin: 4.2 cm       [3.1-3.7]    RIGHT VENTRICLE            ST Junction: nm*          [2.6-3.2]         RV Base: 4.8 cm    [< 4.2]              Asc.Aorta: nm*          [2.6-3.4]          RV Mid: 3.8 cm    [< 3.5]  LEFT VENTRICLE                                      RV Length: nm*       [<8.6]                  LVIDd: 6.3 cm       [4.2-5.9]    INFERIOR VENA CAVA                  LVIDs: 4.5 cm                        Max. IVC: nm*       [<=2.1]                     FS: 28.9 %       [>25]            Min. IVC: nm*                    SWT: 1.3 cm       [0.6-1.0]    ------------------                    PWT: 1.2 cm       [0.6-1.0]    nm* - not measured  LEFT ATRIUM                LA Diam: 4.6 cm       [3.0-4.0]            LA A4C Area: nm*          [<20]              LA Volume: nm*          [18-58]  _________________________________________________________________________________________  ECHOCARDIOGRAPHIC DESCRIPTIONS  AORTIC ROOT                   Size: MILDLY DILATED             Dissection: INDETERM FOR DISSECTION  AORTIC VALVE               Leaflets: Tricuspid                   Morphology: Normal               Mobility: Fully mobile  LEFT VENTRICLE                   Size: Normal                        Anterior:  Normal            Contraction: Normal                         Lateral: Normal             Closest EF: >55% (Estimated)                Septal: Normal              LV  Masses: No Masses                       Apical: Normal                    LVH: MILD LVH CONCENTRIC           Inferior: Normal                                                      Posterior: Normal           Dias.FxClass: (Grade 1) relaxation abnormal, E/A reversal  MITRAL VALVE               Leaflets: Normal                        Mobility: Fully mobile             Morphology: Normal  LEFT ATRIUM                   Size: MILDLY ENLARGED              LA Masses: No masses              IA Septum: Normal IAS  MAIN PA                   Size: Normal  PULMONIC VALVE             Morphology: Normal                        Mobility: Fully mobile  RIGHT VENTRICLE                   Size: MILDLY ENLARGED              Free Wall: Normal            Contraction: Normal                       RV Masses: No Masses  TRICUSPID VALVE               Leaflets: Normal                        Mobility: Fully mobile             Morphology: Normal  RIGHT ATRIUM                   Size: MILDLY ENLARGED               RA Other: None                RA  Mass: No masses  PERICARDIUM                  Fluid: No effusion  INFERIOR VENACAVA                   Size: Normal Normal respiratory collapse  _________________________________________________________________________________________   DOPPLER ECHO and OTHER SPECIAL PROCEDURES                 Aortic: No AR                      No AS                         147.3 cm/sec peak vel      8.7 mmHg peak grad                 Mitral: TRIVIAL MR                 No MS                         MV Inflow E Vel = 85.7 cm/sec      MV Annulus E'Vel = nm*                         E/E'Ratio = nm*              Tricuspid: TRIVIAL TR                 No TS              Pulmonary: TRIVIAL PR                 No PS  _________________________________________________________________________________________  INTERPRETATION  NORMAL LEFT VENTRICULAR SYSTOLIC FUNCTION   WITH MILD LVH  NORMAL RIGHT  VENTRICULAR SYSTOLIC FUNCTION  MILD VALVULAR REGURGITATION (See above)  NO VALVULAR STENOSIS  LVH: MILD LVH  Closest EF: >55% (Estimated)  Mitral: TRIVIAL MR  Tricuspid: TRIVIAL TR  _________________________________________________________________________________________  Electronically signed by            MD Jordan Hawks on 06/03/2020 04: 45 PM           Performed By: Johnathan Hausen, RDCS, RVT     Ordering Physician: Ubaldo Glassing, MD Ashley Akin reviewed by me (LT) 08/10/2022 : Sinus bradycardia in the 50s to sinus rhythm in the 60s, brief run of NSVT  EKG reviewed by me: shows sinus rhythm with poor R wave progression and a nonspecific IVCD without acute ischemia.  Data reviewed by me (LT) 08/10/2022: Last outpatient cardiology note, last nephrology note, admission H&P, ED note, CBC, BMP, troponins, ordered BNP, vitals, telemetry.  Principal Problem:   NSTEMI (non-ST elevated myocardial infarction) ([place]) Active Problems:   Obesity (BMI 30-39.9)   DM2 (diabetes mellitus, type 2) (HCC)   HTN (hypertension)   CKD (chronic kidney disease), stage V (HCC)   Anemia due to chronic kidney disease    ASSESSMENT AND PLAN:  Tramond Slinker. Lee Bryant. is a 66 year old male with a PMH of abnormal Lexiscan Myoview 05/2020 with moderate area of inferior hypoperfusion and EF 40% (medically managed), CKD 5 (Cr 4.8-5.5, GFR 11 Dr. Murlean Iba, referred for [facility] transplant center for evaluation 06/2022) secondary to DM 2 (A1c 6.1% 02/2022) anemia of chronic disease, essential hypertension who presented to [facility] ED 08/05/2022 with worsening  dyspnea on exertion and increased frequency and severity of his chronic angina. Cardiology is consulted for further assistance.  #NSTEMI #3v CAD, (100% CTO RCA) unamenable to intervention Presents with worsening dyspnea on exertion and increased frequency of his episodes of angina (chest tightness, shortness of breath) that went from occurring once monthly for the past several  years, to now happening at least once weekly.  Yesterday he describes having substernal chest pain that is 7/10 that radiated to his jaw, both hands, associated with dyspnea and resolved after 30 minutes of rest.  This was the worst episode he had ever had and he was very concerned about it.  In the ED his initial troponin was elevated at 459, and his had a relatively flat trend at [phone] thereafter.  His EKG shows sinus rhythm with poor R wave progression and a nonspecific IVCD without acute ischemia.  He has been chest pain-free since admission.  His worsening angina with an elevated troponin is consistent with an NSTEMI, for which further evaluation with a left heart cath is recommended.  We will need to coordinate with nephrology prior to [facility] due to his creatinine of 4.95 and GFR of 12 and arrange for immediate dialysis afterwards. -S/p 325 mg aspirin, continue 81 mg aspirin daily, in addition to clopidogrel 75 mg once daily for at least 6 months. -S/p heparin drip  -Continue isosorbide 60 mg once daily -Continue Coreg 3.125 mg twice daily, continue other antihypertensives with amlodipine 7.5 mg daily, losartan 100 mg daily, and clonidine 0.3 mg twice daily -Continue Crestor 40, fenofibrate 54 mg once daily -Cardiac rehab referral at discharge. -Okay for discharge from a cardiac standpoint today, follow-up with Dr. Clayborn Bigness in 1 to 2 weeks for consideration of referral to [facility] for complex PCI versus CABG.  #HFpEF (EF 50-55%, global hypo-, moderate to severe LV dilation, G2 DD #Moderate aortic root dilatation Appears compensated from a heart failure standpoint, continue GDMT as above, recommend further evaluation of his aortic root dilatation on an outpatient basis.  #CKD 5 Current creatinine 4.95 and GFR 12, patient is still making urine.  In the process of setting up home dialysis on an outpatient basis, has appointment with [facility] transplant on November 16. -Plan for PD catheter placement on an  outpatient basis -Dr. Clayborn Bigness discussed with Dr. Candiss Norse via [facility] secure chat, ok from a cardiac standpoint to undergo PD catheter placement under general anesthesia in the next 1-2 weeks.   #Type 2 diabetes A1c from 02/2022 was great at 6.1%.   #Anemia of chronic disease S/p 1 unit of PRBCs, Hgb stable 10/23  This patient's plan of care was discussed and created with Dr. Clayborn Bigness and he is in agreement.  Signed: Tristan Schroeder , PA-C 08/10/2022, 9:31 AM [facility]

## 2022-08-10 NOTE — Progress Notes (Signed)
   08/07/22 1830 08/07/22 1845 08/07/22 1851  Vitals  Temp  --   --  97.6 F (36.4 C)  Temp Source  --   --  Oral  BP (!) 164/77 (!) 144/77 (!) 151/75  MAP (mmHg) 103 99 99  BP Location Left Arm Left Arm Left Arm  BP Method Automatic Automatic Automatic  Patient Position (if appropriate) Lying Lying Lying  Pulse Rate 72 81 80  Pulse Rate Source Monitor Monitor Monitor  ECG Heart Rate 73 83 80  Resp 11 19 14   During Treatment Monitoring  Blood Flow Rate (mL/min) 200 mL/min 200 mL/min  --   Arterial Pressure (mmHg) -80 mmHg -110 mmHg  --   Venous Pressure (mmHg) 90 mmHg 90 mmHg  --   TMP (mmHg) 22 mmHg 22 mmHg  --   Ultrafiltration Rate (mL/min) 300 mL/min 300 mL/min  --   Dialysate Flow Rate (mL/min) 200 ml/min 200 ml/min  --   HD Safety Checks Performed Yes Yes  --   Intra-Hemodialysis Comments Tx initiated Progressing as prescribed (Pt alert, vss, ufr -190, access visible, safety maintained)  --   Dialysis Fluid Bolus  --   --   --   Bolus Amount (mL)  --   --   --     08/07/22 1910 08/07/22 1925 08/07/22 1940  Vitals  Temp 98 F (36.7 C) 98 F (36.7 C) 98.1 F (36.7 C)  Temp Source Oral Oral Oral  BP (!) 143/80 (!) 181/79 (!) 151/85  MAP (mmHg) 95  --  105  BP Location Left Arm  --  Left Arm  BP Method Automatic  --  Automatic  Patient Position (if appropriate) Lying  --  Lying  Pulse Rate 78 80 84  Pulse Rate Source Monitor  --  Monitor  ECG Heart Rate 78 80 82  Resp 14 12 15   During Treatment Monitoring  Blood Flow Rate (mL/min) 200 mL/min  --  200 mL/min  Arterial Pressure (mmHg) -120 mmHg  --  -100 mmHg  Venous Pressure (mmHg) 90 mmHg  --  100 mmHg  TMP (mmHg) 22 mmHg  --  22 mmHg  Ultrafiltration Rate (mL/min) 300 mL/min  --  300 mL/min  Dialysate Flow Rate (mL/min) 200 ml/min  --  200 ml/min  HD Safety Checks Performed Yes  --  Yes  Intra-Hemodialysis Comments Progressing as prescribed (Pt alert, no c/o, stable, access visible, safety maintained, 1uprbc  transfusing)  --  Progressing as prescribed (Pt alert, on phone, transfusion cont., vss, ufr -150, access visible, safety maintained)  Dialysis Fluid Bolus Blood Products  --   --   Bolus Amount (mL) 360 mL  --   --

## 2022-08-10 NOTE — TOC Initial Note (Signed)
Transition of Care (TOC) - Initial/Assessment Note    Patient Details  Name: Lee Bryant. MRN: [id number] Date of Birth: 1956-06-05  Transition of Care [facility]) CM/SW Contact:    Laurena Slimmer, RN Phone Number: 08/10/2022, 4:05 PM  Clinical Narrative:                  Transition of Care [facility]) Screening Note   Patient Details  Name: Lee Bryant. Date of Birth: Oct 05, 1956   Transition of Care [facility]) CM/SW Contact:    Laurena Slimmer, RN Phone Number: 08/10/2022, 4:05 PM    Transition of Care [facility]) has reviewed patient and no TOC needs have been identified at this time. We will continue to monitor patient advancement through interdisciplinary progression rounds. If new patient transition needs arise, please place a TOC consult.          Patient Goals and CMS Choice        Expected Discharge Plan and Services           Expected Discharge Date: 08/10/22                                    Prior Living Arrangements/Services                       Activities of Daily Living Home Assistive Devices/Equipment: Eyeglasses ADL Screening (condition at time of admission) Patient's cognitive ability adequate to safely complete daily activities?: Yes Is the patient deaf or have difficulty hearing?: No Does the patient have difficulty seeing, even when wearing glasses/contacts?: No Does the patient have difficulty concentrating, remembering, or making decisions?: No Patient able to express need for assistance with ADLs?: Yes Does the patient have difficulty dressing or bathing?: No Independently performs ADLs?: Yes (appropriate for developmental age) Does the patient have difficulty walking or climbing stairs?: No Weakness of Legs: None Weakness of Arms/Hands: None  Permission Sought/Granted                  Emotional Assessment              Admission diagnosis:  NSTEMI (non-ST elevated myocardial infarction)  [facility]) [I21.4] Patient Active Problem List   Diagnosis Date Noted   Anemia due to chronic kidney disease 08/07/2022   Obesity (BMI 30-39.9) 08/05/2022   NSTEMI (non-ST elevated myocardial infarction) ([place]) 08/05/2022   DM2 (diabetes mellitus, type 2) ([place]) 08/05/2022   HTN (hypertension) 08/05/2022   CKD (chronic kidney disease), stage V ([place]) 08/05/2022   Diabetic foot infection ([place]) 03/12/2017   Skin ulcer of right ankle with fat layer exposed ([place]) 03/01/2017   Bilateral lower extremity edema 03/01/2017   PCP:  Tracie Harrier, MD Pharmacy:   [place], [place] [address] [place] [address] Phone: [phone] Fax: [place], [place] [address] Phone: [phone] Fax: [place] [address], [place] [address] Phone: [phone] Fax: [phone]     Social Determinants of Health (SDOH) Interventions    Readmission Risk Interventions     No data to display

## 2022-08-10 NOTE — Progress Notes (Signed)
   08/08/22 0840 08/08/22 0900 08/08/22 0930  Vitals  Temp  --   --   --   Temp Source  --   --   --   BP (!) 171/75 (!) 149/69 (!) 146/80  MAP (mmHg) 103 91 97  BP Location Right Arm Right Arm Right Arm  BP Method Automatic Automatic Automatic  Patient Position (if appropriate) Lying Lying Lying  Pulse Rate 73 74 63  Pulse Rate Source Monitor Monitor Monitor  ECG Heart Rate 74 74 70  Resp 14 14 14   During Treatment Monitoring  Blood Flow Rate (mL/min) 250 mL/min 250 mL/min 250 mL/min  Arterial Pressure (mmHg) -90 mmHg -110 mmHg -110 mmHg  Venous Pressure (mmHg) 120 mmHg 120 mmHg 120 mmHg  TMP (mmHg) 22 mmHg 22 mmHg 21 mmHg  Ultrafiltration Rate (mL/min) 280 mL/min 280 mL/min 280 mL/min  Dialysate Flow Rate (mL/min) 200 ml/min 200 ml/min 200 ml/min  HD Safety Checks Performed Yes Yes Yes  Intra-Hemodialysis Comments Tx initiated Progressing as prescribed (Pt alert, watching tv, no c/o, stable, safety maintained, access visible) Progressing as prescribed    08/08/22 1000 08/08/22 1030 08/08/22 1100  Vitals  Temp  --   --   --   Temp Source  --   --   --   BP 136/72 (!) 155/83 (!) 168/82  MAP (mmHg) 92 102 106  BP Location Right Arm Right Arm Right Arm  BP Method Automatic Automatic Automatic  Patient Position (if appropriate) Lying Lying Lying  Pulse Rate 62 73 77  Pulse Rate Source Monitor Monitor Monitor  ECG Heart Rate 65 75 79  Resp 15 14 12   During Treatment Monitoring  Blood Flow Rate (mL/min) 250 mL/min 250 mL/min 250 mL/min  Arterial Pressure (mmHg) -100 mmHg -110 mmHg -110 mmHg  Venous Pressure (mmHg) 120 mmHg 120 mmHg 120 mmHg  TMP (mmHg) 20 mmHg 22 mmHg 23 mmHg  Ultrafiltration Rate (mL/min) 280 mL/min 280 mL/min 280 mL/min  Dialysate Flow Rate (mL/min) 200 ml/min 200 ml/min 200 ml/min  HD Safety Checks Performed Yes Yes Yes  Intra-Hemodialysis Comments Progressing as prescribed Progressing as prescribed Progressing as prescribed (Pt alert, no c/o, vss, ufr  -40, access visible, safety maintained)    08/08/22 1111  Vitals  Temp 98.4 F (36.9 C)  Temp Source Oral  BP (!) 164/104  MAP (mmHg) 120  BP Location Right Arm  BP Method Automatic  Patient Position (if appropriate) Lying  Pulse Rate 79  Pulse Rate Source Monitor  ECG Heart Rate 78  Resp 19  During Treatment Monitoring  Blood Flow Rate (mL/min)  --   Arterial Pressure (mmHg)  --   Venous Pressure (mmHg)  --   TMP (mmHg)  --   Ultrafiltration Rate (mL/min)  --   Dialysate Flow Rate (mL/min)  --   HD Safety Checks Performed Yes  Intra-Hemodialysis Comments Tolerated well;Tx completed

## 2022-08-10 NOTE — Progress Notes (Signed)
IV team nurse came by to remove right groin dialysis cath. States for pt to remain flat for 30 minutes until 2230. Pt site clean , dry and intact no bleeding noted. Will continue to monitor, call bell within reach.

## 2022-08-10 NOTE — Progress Notes (Addendum)
Pt being d/c home, Wife at bedside . Vitals stable. D/c paperwork and AVS given. Right femoral dialysis cath was removed by IV team, site clean dry and intact no signs of bleeding. Pt wheeled out via staff. Pt has no concerns at this time. Wife at bedside.   08/10/22 2319  Vitals  Temp 98.3 F (36.8 C)  BP (!) 167/74  MAP (mmHg) 102  BP Location Right Arm  BP Method Automatic  Patient Position (if appropriate) Sitting  Pulse Rate 74  Pulse Rate Source Monitor  Resp 18  Level of Consciousness  Level of Consciousness Alert  MEWS COLOR  MEWS Score Color Green  Oxygen Therapy  SpO2 97 %  O2 Device Room Air  MEWS Score  MEWS Temp 0  MEWS Systolic 0  MEWS Pulse 0  MEWS RR 0  MEWS LOC 0  MEWS Score 0

## 2022-08-10 NOTE — Care Management Important Message (Signed)
Important Message  Patient Details  Name: Lee Bryant. MRN: [id number] Date of Birth: [birth year]   Medicare Important Message Given:  Yes     Dannette Barbara 08/10/2022, 3:43 PM

## 2022-08-10 NOTE — Progress Notes (Signed)
[facility]  PROGRESS NOTE   Subjective:   Patient seen sitting at side of bed, currently upset Patient is voices his frustration with treatment plan, does not understand the reason for temp cath versus PermCath.  States he was told he can discharge today but then told he needs to have PermCath placed.  Frustrated with communication.  Objective:  Vital signs: Blood pressure (!) 144/70, pulse 64, temperature 98.3 F (36.8 C), temperature source Oral, resp. rate 10, height 6\' 1"  (1.854 m), weight 116.4 kg, SpO2 95 %. No intake or output data in the 24 hours ending 08/10/22 1536  Filed Weights   08/08/22 1128 08/09/22 1048 08/10/22 1505  Weight: 120.1 kg 119.1 kg 116.4 kg     Physical Exam: General:  No acute distress  Head:  Normocephalic, atraumatic. Moist oral mucosal membranes  Eyes:  Anicteric  Lungs:   Clear to auscultation, normal effort  Heart:  S1S2 no rubs  Abdomen:   Soft, nontender, bowel sounds present  Extremities: No peripheral edema.  Neurologic:  Awake, alert, following commands  Skin:  No lesions  Access: Right femoral HD temp cath    Basic Metabolic Panel: Recent Labs  Lab 08/05/22 1545 08/06/22 0303 08/07/22 0604 08/08/22 1111 08/10/22 1023  NA 135 137 136 135 136  K 4.3 4.1 4.4 3.5 3.9  CL 106 110 109 101 104  CO2 21* 19* 23 27 22   GLUCOSE 123* 165* 113* 100* 174*  BUN 76* 69* 67* 26* 63*  CREATININE 5.02* 4.95* 4.89* 2.47* 5.34*  CALCIUM 9.4 8.9 8.7* 8.9 8.9  PHOS  --   --   --  2.1*  --      CBC: Recent Labs  Lab 08/06/22 0303 08/07/22 0604 08/07/22 2326 08/08/22 1111 08/10/22 1023  WBC 7.1 7.1 7.6 7.2 7.9  HGB 7.1* 6.9* 8.1* 8.7* 8.2*  HCT 23.2* 23.1* 25.2* 26.7* 26.0*  MCV 86.9 88.5 84.6 83.7 84.4  PLT 216 203 205 204 172      Urinalysis: No results for input(s): "COLORURINE", "LABSPEC", "PHURINE", "GLUCOSEU", "HGBUR", "BILIRUBINUR", "KETONESUR", "PROTEINUR", "UROBILINOGEN", "NITRITE", "LEUKOCYTESUR" in the  last 72 hours.  Invalid input(s): "APPERANCEUR"    Imaging: No results found.   Medications:    sodium chloride Stopped (08/05/22 2253)   sodium chloride      sodium chloride   Intravenous Once   amLODipine  7.5 mg Oral Daily   aspirin EC  81 mg Oral Daily   carvedilol  3.125 mg Oral BID WC   Chlorhexidine Gluconate Cloth  6 each Topical Q0600   cloNIDine  0.3 mg Oral BID   clopidogrel  75 mg Oral Daily   doxazosin  4 mg Oral QHS   DULoxetine  30 mg Oral Daily   fenofibrate  54 mg Oral Daily   ferrous gluconate  324 mg Oral Q breakfast   gabapentin  600 mg Oral TID   hydrALAZINE  100 mg Oral TID   [START ON 08/11/2022] influenza vaccine adjuvanted  0.5 mL Intramuscular Tomorrow-1000   insulin aspart  0-20 Units Subcutaneous TID WC   insulin glargine-yfgn  10 Units Subcutaneous QHS   isosorbide mononitrate  60 mg Oral Daily   losartan  100 mg Oral Daily   melatonin  5 mg Oral QHS   [START ON 08/11/2022] pneumococcal 20-valent conjugate vaccine  0.5 mL Intramuscular Tomorrow-1000   rOPINIRole  0.5 mg Oral QHS   rosuvastatin  40 mg Oral Daily   sodium chloride flush  3 mL Intravenous Q12H   sodium chloride flush  3 mL Intravenous Q12H    Assessment/ Plan:     Principal Problem:   NSTEMI (non-ST elevated myocardial infarction) ([place]) Active Problems:   Obesity (BMI 30-39.9)   DM2 (diabetes mellitus, type 2) ([facility])   HTN (hypertension)   CKD (chronic kidney disease), stage V ([facility])   Anemia due to chronic kidney disease  66 year old male with history of hypertension, coronary artery disease, diabetes, obesity, chronic kidney disease stage IV/V initially admitted for chest pain.  Had his cardiac catheterization showing two-vessel disease with right coronary artery and circumflex.  No intervention was done and was opted for medical treatment.  Since patient received large amount of contrast he was initiated on dialysis and had to stable treatments.  Patient also had low  hemoglobin and was given transfusion.  #1: CKD/end-stage renal disease: Patient has history of CKD stage V now s/p IV contrast.  Patient has responded well to dialysis treatments, decision made to proceed with third treatment today based on a.m. labs.  Cardiology has cleared patient for general anesthesia needed for PD catheter placement.  Placed order to discontinue temp cath after dialysis today.  Patient cleared to discharge from renal stance and we will follow-up with general surgery for PD catheter placement.  Patient will likely need urgent start PD after placement.  This will all be arranged outpatient.  #2: Anemia with chronic kidney disease: Hemoglobin 8.2, below desired target.  We will continue to monitor.  #3: Hypertension with chronic kidney disease: Blood pressure is better controlled at this time.  We may have to slowly wean him off of some of the medications.   #4: Diabetes melitis type II with chronic kidney disease.:  Hemoglobin A1c 5.5 on 08/05/2022.  Glucose well controlled.  Continue insulin as ordered.   LOS: [facility] 10/23/20233:36 PM

## 2022-08-10 NOTE — Progress Notes (Signed)
PT tolerated 3 hrs of HD with no issue or signs of  distress  UF = 0 ml  Report given to floor RN.   08/10/22 1815  Vitals  Temp 98.1 F (36.7 C)  Temp Source Oral  BP (!) 171/88  MAP (mmHg) 114  BP Location Right Arm  BP Method Automatic  Patient Position (if appropriate) Lying  Pulse Rate 73  Pulse Rate Source Monitor  ECG Heart Rate 73  Resp (!) 130  Oxygen Therapy  SpO2 97 %  O2 Device Room Air  Patient Activity (if Appropriate) In bed  Pulse Oximetry Type Continuous  During Treatment Monitoring  HD Safety Checks Performed Yes  Intra-Hemodialysis Comments Tx completed;Tolerated well  Post Treatment  Dialyzer Clearance Lightly streaked  Duration of HD Treatment -hour(s) 3 hour(s)  Hemodialysis Intake (mL) 0 mL  Liters Processed 63  Fluid Removed 0 mL  Tolerated HD Treatment Yes  Post-Hemodialysis Comments HD complete. Pt tolerated tx well. No complications.  Hemodialysis Catheter Right Femoral vein Triple lumen Temporary (Non-Tunneled)  Placement Date/Time: 08/07/22 1548   Time Out: Correct patient;Correct site;Correct procedure  Maximum sterile barrier precautions: Hand hygiene;Cap;Mask;Sterile gown;Sterile gloves;Large sterile sheet  Site Prep: Chlorhexidine (preferred);Skin Prep C...  Site Condition No complications  Blue Lumen Status Flushed;Saline locked  Red Lumen Status Flushed;Saline locked  Purple Lumen Status N/A  Dressing Type Transparent  Dressing Status Antimicrobial disc in place;Allergy to antimicrobial disc  Drainage Description None  Dressing Change Due 08/14/22  Post treatment catheter status Capped and Clamped

## 2022-08-11 NOTE — Plan of Care (Signed)
  Problem: Education: Goal: Ability to describe self-care measures that may prevent or decrease complications (Diabetes Survival Skills Education) will improve Outcome: Progressing Goal: Individualized Educational Video(s) Outcome: Progressing   Problem: Coping: Goal: Ability to adjust to condition or change in health will improve Outcome: Progressing   Problem: Fluid Volume: Goal: Ability to maintain a balanced intake and output will improve Outcome: Progressing   Problem: Health Behavior/Discharge Planning: Goal: Ability to identify and utilize available resources and services will improve Outcome: Progressing Goal: Ability to manage health-related needs will improve Outcome: Progressing   Problem: Metabolic: Goal: Ability to maintain appropriate glucose levels will improve Outcome: Progressing   Problem: Nutritional: Goal: Maintenance of adequate nutrition will improve Outcome: Progressing Goal: Progress toward achieving an optimal weight will improve Outcome: Progressing   Problem: Skin Integrity: Goal: Risk for impaired skin integrity will decrease Outcome: Progressing   Problem: Tissue Perfusion: Goal: Adequacy of tissue perfusion will improve Outcome: Progressing   Problem: Education: Goal: Understanding of cardiac disease, CV risk reduction, and recovery process will improve Outcome: Progressing Goal: Individualized Educational Video(s) Outcome: Progressing   Problem: Activity: Goal: Ability to tolerate increased activity will improve Outcome: Progressing   Problem: Cardiac: Goal: Ability to achieve and maintain adequate cardiovascular perfusion will improve Outcome: Progressing   Problem: Health Behavior/Discharge Planning: Goal: Ability to safely manage health-related needs after discharge will improve Outcome: Progressing   Problem: Education: Goal: Understanding of CV disease, CV risk reduction, and recovery process will improve Outcome:  Progressing Goal: Individualized Educational Video(s) Outcome: Progressing   Problem: Activity: Goal: Ability to return to baseline activity level will improve Outcome: Progressing   Problem: Cardiovascular: Goal: Ability to achieve and maintain adequate cardiovascular perfusion will improve Outcome: Progressing Goal: Vascular access site(s) Level 0-1 will be maintained Outcome: Progressing   Problem: Health Behavior/Discharge Planning: Goal: Ability to safely manage health-related needs after discharge will improve Outcome: Progressing   Problem: Education: Goal: Knowledge of General Education information will improve Description: Including pain rating scale, medication(s)/side effects and non-pharmacologic comfort measures Outcome: Progressing   Problem: Health Behavior/Discharge Planning: Goal: Ability to manage health-related needs will improve Outcome: Progressing   Problem: Clinical Measurements: Goal: Ability to maintain clinical measurements within normal limits will improve Outcome: Progressing Goal: Will remain free from infection Outcome: Progressing Goal: Diagnostic test results will improve Outcome: Progressing Goal: Respiratory complications will improve Outcome: Progressing Goal: Cardiovascular complication will be avoided Outcome: Progressing   Problem: Activity: Goal: Risk for activity intolerance will decrease Outcome: Progressing   Problem: Nutrition: Goal: Adequate nutrition will be maintained Outcome: Progressing   Problem: Coping: Goal: Level of anxiety will decrease Outcome: Progressing   Problem: Elimination: Goal: Will not experience complications related to bowel motility Outcome: Progressing Goal: Will not experience complications related to urinary retention Outcome: Progressing   Problem: Pain Managment: Goal: General experience of comfort will improve Outcome: Progressing   Problem: Safety: Goal: Ability to remain free from  injury will improve Outcome: Progressing   Problem: Skin Integrity: Goal: Risk for impaired skin integrity will decrease Outcome: Progressing   

## 2022-08-11 NOTE — Plan of Care (Signed)
Problem: Education: Goal: Ability to describe self-care measures that may prevent or decrease complications (Diabetes Survival Skills Education) will improve 08/11/2022 0055 by Quintin Alto, RN Outcome: Adequate for Discharge 08/11/2022 0054 by Quintin Alto, RN Outcome: Progressing Goal: Individualized Educational Video(s) 08/11/2022 0055 by Quintin Alto, RN Outcome: Adequate for Discharge 08/11/2022 0054 by Quintin Alto, RN Outcome: Progressing   Problem: Coping: Goal: Ability to adjust to condition or change in health will improve 08/11/2022 0055 by Quintin Alto, RN Outcome: Adequate for Discharge 08/11/2022 0054 by Quintin Alto, RN Outcome: Progressing   Problem: Fluid Volume: Goal: Ability to maintain a balanced intake and output will improve 08/11/2022 0055 by Quintin Alto, RN Outcome: Adequate for Discharge 08/11/2022 0054 by Quintin Alto, RN Outcome: Progressing   Problem: Health Behavior/Discharge Planning: Goal: Ability to identify and utilize available resources and services will improve 08/11/2022 0055 by Quintin Alto, RN Outcome: Adequate for Discharge 08/11/2022 0054 by Quintin Alto, RN Outcome: Progressing Goal: Ability to manage health-related needs will improve 08/11/2022 0055 by Quintin Alto, RN Outcome: Adequate for Discharge 08/11/2022 0054 by Quintin Alto, RN Outcome: Progressing   Problem: Metabolic: Goal: Ability to maintain appropriate glucose levels will improve 08/11/2022 0055 by Quintin Alto, RN Outcome: Adequate for Discharge 08/11/2022 0054 by Quintin Alto, RN Outcome: Progressing   Problem: Nutritional: Goal: Maintenance of adequate nutrition will improve 08/11/2022 0055 by Quintin Alto, RN Outcome: Adequate for Discharge 08/11/2022 0054 by Quintin Alto, RN Outcome: Progressing Goal: Progress toward achieving an optimal weight will improve 08/11/2022 0055 by Quintin Alto, RN Outcome: Adequate for  Discharge 08/11/2022 0054 by Quintin Alto, RN Outcome: Progressing   Problem: Skin Integrity: Goal: Risk for impaired skin integrity will decrease 08/11/2022 0055 by Quintin Alto, RN Outcome: Adequate for Discharge 08/11/2022 0054 by Quintin Alto, RN Outcome: Progressing   Problem: Tissue Perfusion: Goal: Adequacy of tissue perfusion will improve 08/11/2022 0055 by Quintin Alto, RN Outcome: Adequate for Discharge 08/11/2022 0054 by Quintin Alto, RN Outcome: Progressing   Problem: Education: Goal: Understanding of cardiac disease, CV risk reduction, and recovery process will improve 08/11/2022 0055 by Quintin Alto, RN Outcome: Adequate for Discharge 08/11/2022 0054 by Quintin Alto, RN Outcome: Progressing Goal: Individualized Educational Video(s) 08/11/2022 0055 by Quintin Alto, RN Outcome: Adequate for Discharge 08/11/2022 0054 by Quintin Alto, RN Outcome: Progressing   Problem: Activity: Goal: Ability to tolerate increased activity will improve 08/11/2022 0055 by Quintin Alto, RN Outcome: Adequate for Discharge 08/11/2022 0054 by Quintin Alto, RN Outcome: Progressing   Problem: Cardiac: Goal: Ability to achieve and maintain adequate cardiovascular perfusion will improve 08/11/2022 0055 by Quintin Alto, RN Outcome: Adequate for Discharge 08/11/2022 0054 by Quintin Alto, RN Outcome: Progressing   Problem: Health Behavior/Discharge Planning: Goal: Ability to safely manage health-related needs after discharge will improve 08/11/2022 0055 by Quintin Alto, RN Outcome: Adequate for Discharge 08/11/2022 0054 by Quintin Alto, RN Outcome: Progressing   Problem: Education: Goal: Understanding of CV disease, CV risk reduction, and recovery process will improve 08/11/2022 0055 by Quintin Alto, RN Outcome: Adequate for Discharge 08/11/2022 0054 by Quintin Alto, RN Outcome: Progressing Goal: Individualized Educational Video(s) 08/11/2022  0055 by Quintin Alto, RN Outcome: Adequate for Discharge 08/11/2022 0054 by Quintin Alto, RN Outcome: Progressing   Problem: Activity: Goal: Ability to return to baseline activity level will improve 08/11/2022 0055 by Quintin Alto, RN Outcome: Adequate for Discharge 08/11/2022 0054 by Quintin Alto, RN Outcome: Progressing   Problem: Cardiovascular: Goal: Ability to achieve and maintain adequate cardiovascular perfusion will improve 08/11/2022  [id number] by Quintin Alto, RN Outcome: Adequate for Discharge 08/11/2022 0054 by Quintin Alto, RN Outcome: Progressing Goal: Vascular access site(s) Level 0-1 will be maintained 08/11/2022 0055 by Quintin Alto, RN Outcome: Adequate for Discharge 08/11/2022 0054 by Quintin Alto, RN Outcome: Progressing   Problem: Health Behavior/Discharge Planning: Goal: Ability to safely manage health-related needs after discharge will improve 08/11/2022 0055 by Quintin Alto, RN Outcome: Adequate for Discharge 08/11/2022 0054 by Quintin Alto, RN Outcome: Progressing   Problem: Education: Goal: Knowledge of General Education information will improve Description: Including pain rating scale, medication(s)/side effects and non-pharmacologic comfort measures 08/11/2022 0055 by Quintin Alto, RN Outcome: Adequate for Discharge 08/11/2022 0054 by Quintin Alto, RN Outcome: Progressing   Problem: Health Behavior/Discharge Planning: Goal: Ability to manage health-related needs will improve 08/11/2022 0055 by Quintin Alto, RN Outcome: Adequate for Discharge 08/11/2022 0054 by Quintin Alto, RN Outcome: Progressing   Problem: Clinical Measurements: Goal: Ability to maintain clinical measurements within normal limits will improve 08/11/2022 0055 by Quintin Alto, RN Outcome: Adequate for Discharge 08/11/2022 0054 by Quintin Alto, RN Outcome: Progressing Goal: Will remain free from infection 08/11/2022 0055 by Quintin Alto,  RN Outcome: Adequate for Discharge 08/11/2022 0054 by Quintin Alto, RN Outcome: Progressing Goal: Diagnostic test results will improve 08/11/2022 0055 by Quintin Alto, RN Outcome: Adequate for Discharge 08/11/2022 0054 by Quintin Alto, RN Outcome: Progressing Goal: Respiratory complications will improve 08/11/2022 0055 by Quintin Alto, RN Outcome: Adequate for Discharge 08/11/2022 0054 by Quintin Alto, RN Outcome: Progressing Goal: Cardiovascular complication will be avoided 08/11/2022 0055 by Quintin Alto, RN Outcome: Adequate for Discharge 08/11/2022 0054 by Quintin Alto, RN Outcome: Progressing   Problem: Activity: Goal: Risk for activity intolerance will decrease 08/11/2022 0055 by Quintin Alto, RN Outcome: Adequate for Discharge 08/11/2022 0054 by Quintin Alto, RN Outcome: Progressing   Problem: Nutrition: Goal: Adequate nutrition will be maintained 08/11/2022 0055 by Quintin Alto, RN Outcome: Adequate for Discharge 08/11/2022 0054 by Quintin Alto, RN Outcome: Progressing   Problem: Coping: Goal: Level of anxiety will decrease 08/11/2022 0055 by Quintin Alto, RN Outcome: Adequate for Discharge 08/11/2022 0054 by Quintin Alto, RN Outcome: Progressing   Problem: Elimination: Goal: Will not experience complications related to bowel motility 08/11/2022 0055 by Quintin Alto, RN Outcome: Adequate for Discharge 08/11/2022 0054 by Quintin Alto, RN Outcome: Progressing Goal: Will not experience complications related to urinary retention 08/11/2022 0055 by Quintin Alto, RN Outcome: Adequate for Discharge 08/11/2022 0054 by Quintin Alto, RN Outcome: Progressing   Problem: Pain Managment: Goal: General experience of comfort will improve 08/11/2022 0055 by Quintin Alto, RN Outcome: Adequate for Discharge 08/11/2022 0054 by Quintin Alto, RN Outcome: Progressing   Problem: Safety: Goal: Ability to remain free from injury will  improve 08/11/2022 0055 by Quintin Alto, RN Outcome: Adequate for Discharge 08/11/2022 0054 by Quintin Alto, RN Outcome: Progressing   Problem: Skin Integrity: Goal: Risk for impaired skin integrity will decrease 08/11/2022 0055 by Quintin Alto, RN Outcome: Adequate for Discharge 08/11/2022 0054 by Quintin Alto, RN Outcome: Progressing

## 2022-08-19 NOTE — Progress Notes (Unsigned)
Patient ID: Lee Mccauley., male   DOB: [birth year], 66 y.o.   MRN: [id number]  Chief Complaint: Referral for CAPD catheter placement  History of Present Illness Lee Ryle. is a 66 y.o. male with stage V chronic kidney disease, had hemodialysis via right groin access when hospitalized.  Currently maintaining 11% function of his kidneys.  Reports it was not worsened by his recent coronary catheterization.  Prior abdominal surgery history consist of a diverting colostomy in order to have sacral decubitus healed.  Its been reversed, no other abdominal surgeries.  Past Medical History Past Medical History:  Diagnosis Date   CAD (coronary artery disease)    had abnormal nuclear stress test 2021 or 2022   Cellulitis    CKD (chronic kidney disease), stage V (HCC)    Decubitus ulcer of sacral region, stage 4 ([place])    resolved - required multiple surgical interventions   Diabetes mellitus ([place])    type II   HLD (hyperlipidemia)    Hypertension    Obesity (BMI 30-39.9)       Past Surgical History:  Procedure Laterality Date   COLOSTOMY     ducubitus ulcer debridement N/A    LEFT HEART CATH AND CORONARY ANGIOGRAPHY N/A 08/07/2022   Procedure: LEFT HEART CATH AND CORONARY ANGIOGRAPHY;  Surgeon: Yolonda Kida, MD;  Location: [facility];  Service: Cardiovascular;  Laterality: N/A;   TEMPORARY DIALYSIS CATHETER Right 08/07/2022   Procedure: TEMPORARY DIALYSIS CATHETER;  Surgeon: Algernon Huxley, MD;  Location: [facility];  Service: Cardiovascular;  Laterality: Right;    Allergies  Allergen Reactions   Cephalexin Rash    Current Outpatient Medications  Medication Sig Dispense Refill   amLODipine (NORVASC) 5 MG tablet Take 5 mg by mouth daily.     aspirin EC 81 MG tablet Take 81 mg by mouth daily.     carvedilol (COREG) 3.125 MG tablet Take 1 tablet (3.125 mg total) by mouth 2 (two) times daily with a meal. 60 tablet 1   cloNIDine (CATAPRES) 0.3 MG  tablet Take 0.3 mg by mouth 2 (two) times daily.     clopidogrel (PLAVIX) 75 MG tablet Take 1 tablet (75 mg total) by mouth daily. 30 tablet 1   collagenase (SANTYL) ointment Apply topically.     cyclobenzaprine (FLEXERIL) 10 MG tablet      doxazosin (CARDURA) 4 MG tablet Take 1 tablet (4 mg total) by mouth at bedtime. 30 tablet 0   DULoxetine (CYMBALTA) 30 MG capsule Take 30 mg by mouth daily.     fenofibrate (TRICOR) 48 MG tablet Take 48 mg by mouth daily.     ferrous gluconate (FERGON) 324 MG tablet Take 1 tablet (324 mg total) by mouth daily with breakfast. 90 tablet 3   isosorbide mononitrate (IMDUR) 30 MG 24 hr tablet Take 30 mg by mouth daily.     lidocaine (XYLOCAINE) 2 % jelly      oxyCODONE-acetaminophen (PERCOCET/ROXICET) 5-325 MG tablet Take 1 tablet by mouth 2 (two) times daily as needed.     rOPINIRole (REQUIP) 0.5 MG tablet Take 1 tablet (0.5 mg total) by mouth at bedtime. 30 tablet 1   rosuvastatin (CRESTOR) 10 MG tablet Take 10 mg by mouth daily.     valsartan-hydrochlorothiazide (DIOVAN-HCT) 320-12.5 MG tablet TAKE ONE (1) TABLET EACH DAY     glimepiride (AMARYL) 4 MG tablet Take 4 mg by mouth 2 (two) times daily.     hydrALAZINE (APRESOLINE)  25 MG tablet Take 25 mg by mouth daily.     No current facility-administered medications for this visit.    Family History Family History  Problem Relation Age of Onset   Cancer Mother    Hypertension Mother    Heart attack Father    Cancer Father    Stroke Sister    Diabetes Brother    Kidney failure Brother       Social History Social History   Tobacco Use   Smoking status: Former    Types: Cigarettes    Quit date: 07/30/2022    Years since quitting: 0.0   Smokeless tobacco: Never   Tobacco comments:    when he was 66yrs old.  Vaping Use   Vaping Use: Never used  Substance Use Topics   Alcohol use: Not Currently    Comment: occassionally.   Drug use: Not Currently    Types: Marijuana        Review of  Systems  Constitutional: Negative.   HENT: Negative.    Eyes: Negative.   Respiratory: Negative.    Cardiovascular: Negative.   Gastrointestinal: Negative.   Genitourinary: Negative.   Skin: Negative.   Neurological: Negative.   Psychiatric/Behavioral: Negative.        Physical Exam Blood pressure 129/61, pulse 65, temperature 98.5 F (36.9 C), temperature source Oral, height 6\' 1"  (1.854 m), weight 268 lb (121.6 kg), SpO2 96 %. Last Weight  Most recent update: 08/20/2022  2:21 PM    Weight  121.6 kg (268 lb)             CONSTITUTIONAL: Well developed, and nourished, appropriately responsive and aware without distress.   EYES: Sclera non-icteric.   EARS, NOSE, MOUTH AND THROAT:  The oropharynx is clear. Oral mucosa is pink and moist.  Dentition: Various teeth absent.   Hearing is intact to voice.  NECK: Trachea is midline, and there is no jugular venous distension.  LYMPH NODES:  Lymph nodes in the neck are not enlarged. RESPIRATORY:  Lungs are clear, and breath sounds are equal bilaterally. Normal respiratory effort without pathologic use of accessory muscles. CARDIOVASCULAR: Heart is regular in rate and rhythm. GI: The abdomen is notable for a left lower quadrant dimpled scar from his colostomy.  Otherwise soft, nontender, and nondistended. There were no palpable masses. I did not appreciate hepatosplenomegaly. There were normal bowel sounds. MUSCULOSKELETAL:  Symmetrical muscle tone appreciated in all four extremities.    SKIN: Skin turgor is normal. No pathologic skin lesions appreciated.  NEUROLOGIC:  Motor and sensation appear grossly normal.  Cranial nerves are grossly without defect. PSYCH:  Alert and oriented to person, place and time. Affect is appropriate for situation.  Data Reviewed I have personally reviewed what is currently available of the patient's imaging, recent labs and medical records.   Labs:     Latest Ref Rng & Units 08/10/2022   10:23 AM 08/08/2022    11:11 AM 08/07/2022   11:26 PM  CBC  WBC 4.0 - 10.5 K/uL 7.9  7.2  7.6   Hemoglobin 13.0 - 17.0 g/dL 8.2  8.7  8.1   Hematocrit 39.0 - 52.0 % 26.0  26.7  25.2   Platelets 150 - 400 K/uL 172  204  205       Latest Ref Rng & Units 08/10/2022   10:23 AM 08/08/2022   11:11 AM 08/07/2022    6:04 AM  CMP  Glucose 70 - 99 mg/dL 174  100  113   BUN 8 - 23 mg/dL 63  26  67   Creatinine 0.61 - 1.24 mg/dL 5.34  2.47  4.89   Sodium 135 - 145 mmol/L 136  135  136   Potassium 3.5 - 5.1 mmol/L 3.9  3.5  4.4   Chloride 98 - 111 mmol/L 104  101  109   CO2 22 - 32 mmol/L 22  27  23    Calcium 8.9 - 10.3 mg/dL 8.9  8.9  8.7       Imaging: Radiological images reviewed:   Within last 24 hrs: No results found.  Assessment    Candidate for CAPD catheter placement, desiring home peritoneal dialysis. Patient Active Problem List   Diagnosis Date Noted   Anemia due to chronic kidney disease 08/07/2022   NSTEMI (non-ST elevated myocardial infarction) ([place]) 08/05/2022   DM2 (diabetes mellitus, type 2) ([place]) 08/05/2022   HTN (hypertension) 08/05/2022   BMI 35.0-35.9,adult 05/19/2022   Chronic kidney disease, stage 5 ([place]) 02/23/2020   Type 2 diabetes mellitus ([place]) 06/26/2017   Diabetic foot infection ([place]) 03/12/2017   Lower extremity ulceration ([place]) 03/01/2017   Bilateral lower extremity edema 03/01/2017    Plan    Laparoscopic CAPD catheter placement.  Probable omentopexy  We will need to hold Plavix preop, 5 days ideal, if acceptable by Cardiology.    Risks include but are not limited to anesthesia, bleeding, catheter dysfunction, catheter infection.  Exit site infection, catheter migration etc.  I believe patient understands these are not all inclusive and desires to proceed.  Understanding alternative options for treating his renal failure.  Questions answered, no guarantees ever expressed or implied.   Face-to-face time spent with the patient and accompanying care providers(if  present) was 40 minutes, with more than 50% of the time spent counseling, educating, and coordinating care of the patient.    These notes generated with voice recognition software. I apologize for typographical errors.  Ronny Bacon M.D., FACS 08/20/2022, 3:38 PM

## 2022-08-19 NOTE — H&P (View-Only) (Signed)
Patient ID: Lee Bryant., male   DOB: Feb 18, 1956, 66 y.o.   MRN: [id number]  Chief Complaint: Referral for CAPD catheter placement  History of Present Illness Lee Bryant. is a 66 y.o. male with stage V chronic kidney disease, had hemodialysis via right groin access when hospitalized.  Currently maintaining 11% function of his kidneys.  Reports it was not worsened by his recent coronary catheterization.  Prior abdominal surgery history consist of a diverting colostomy in order to have sacral decubitus healed.  Its been reversed, no other abdominal surgeries.  Past Medical History Past Medical History:  Diagnosis Date   CAD (coronary artery disease)    had abnormal nuclear stress test 2021 or 2022   Cellulitis    CKD (chronic kidney disease), stage V (HCC)    Decubitus ulcer of sacral region, stage 4 ([place])    resolved - required multiple surgical interventions   Diabetes mellitus ([place])    type II   HLD (hyperlipidemia)    Hypertension    Obesity (BMI 30-39.9)       Past Surgical History:  Procedure Laterality Date   COLOSTOMY     ducubitus ulcer debridement N/A    LEFT HEART CATH AND CORONARY ANGIOGRAPHY N/A 08/07/2022   Procedure: LEFT HEART CATH AND CORONARY ANGIOGRAPHY;  Surgeon: Yolonda Kida, MD;  Location: [facility];  Service: Cardiovascular;  Laterality: N/A;   TEMPORARY DIALYSIS CATHETER Right 08/07/2022   Procedure: TEMPORARY DIALYSIS CATHETER;  Surgeon: Algernon Huxley, MD;  Location: [facility];  Service: Cardiovascular;  Laterality: Right;    Allergies  Allergen Reactions   Cephalexin Rash    Current Outpatient Medications  Medication Sig Dispense Refill   amLODipine (NORVASC) 5 MG tablet Take 5 mg by mouth daily.     aspirin EC 81 MG tablet Take 81 mg by mouth daily.     carvedilol (COREG) 3.125 MG tablet Take 1 tablet (3.125 mg total) by mouth 2 (two) times daily with a meal. 60 tablet 1   cloNIDine (CATAPRES) 0.3 MG  tablet Take 0.3 mg by mouth 2 (two) times daily.     clopidogrel (PLAVIX) 75 MG tablet Take 1 tablet (75 mg total) by mouth daily. 30 tablet 1   collagenase (SANTYL) ointment Apply topically.     cyclobenzaprine (FLEXERIL) 10 MG tablet      doxazosin (CARDURA) 4 MG tablet Take 1 tablet (4 mg total) by mouth at bedtime. 30 tablet 0   DULoxetine (CYMBALTA) 30 MG capsule Take 30 mg by mouth daily.     fenofibrate (TRICOR) 48 MG tablet Take 48 mg by mouth daily.     ferrous gluconate (FERGON) 324 MG tablet Take 1 tablet (324 mg total) by mouth daily with breakfast. 90 tablet 3   isosorbide mononitrate (IMDUR) 30 MG 24 hr tablet Take 30 mg by mouth daily.     lidocaine (XYLOCAINE) 2 % jelly      oxyCODONE-acetaminophen (PERCOCET/ROXICET) 5-325 MG tablet Take 1 tablet by mouth 2 (two) times daily as needed.     rOPINIRole (REQUIP) 0.5 MG tablet Take 1 tablet (0.5 mg total) by mouth at bedtime. 30 tablet 1   rosuvastatin (CRESTOR) 10 MG tablet Take 10 mg by mouth daily.     valsartan-hydrochlorothiazide (DIOVAN-HCT) 320-12.5 MG tablet TAKE ONE (1) TABLET EACH DAY     glimepiride (AMARYL) 4 MG tablet Take 4 mg by mouth 2 (two) times daily.     hydrALAZINE (APRESOLINE)  25 MG tablet Take 25 mg by mouth daily.     No current facility-administered medications for this visit.    Family History Family History  Problem Relation Age of Onset   Cancer Mother    Hypertension Mother    Heart attack Father    Cancer Father    Stroke Sister    Diabetes Brother    Kidney failure Brother       Social History Social History   Tobacco Use   Smoking status: Former    Types: Cigarettes    Quit date: 07/30/2022    Years since quitting: 0.0   Smokeless tobacco: Never   Tobacco comments:    when he was 66yrs old.  Vaping Use   Vaping Use: Never used  Substance Use Topics   Alcohol use: Not Currently    Comment: occassionally.   Drug use: Not Currently    Types: Marijuana        Review of  Systems  Constitutional: Negative.   HENT: Negative.    Eyes: Negative.   Respiratory: Negative.    Cardiovascular: Negative.   Gastrointestinal: Negative.   Genitourinary: Negative.   Skin: Negative.   Neurological: Negative.   Psychiatric/Behavioral: Negative.        Physical Exam Blood pressure 129/61, pulse 65, temperature 98.5 F (36.9 C), temperature source Oral, height 6\' 1"  (1.854 m), weight 268 lb (121.6 kg), SpO2 96 %. Last Weight  Most recent update: 08/20/2022  2:21 PM    Weight  121.6 kg (268 lb)             CONSTITUTIONAL: Well developed, and nourished, appropriately responsive and aware without distress.   EYES: Sclera non-icteric.   EARS, NOSE, MOUTH AND THROAT:  The oropharynx is clear. Oral mucosa is pink and moist.  Dentition: Various teeth absent.   Hearing is intact to voice.  NECK: Trachea is midline, and there is no jugular venous distension.  LYMPH NODES:  Lymph nodes in the neck are not enlarged. RESPIRATORY:  Lungs are clear, and breath sounds are equal bilaterally. Normal respiratory effort without pathologic use of accessory muscles. CARDIOVASCULAR: Heart is regular in rate and rhythm. GI: The abdomen is notable for a left lower quadrant dimpled scar from his colostomy.  Otherwise soft, nontender, and nondistended. There were no palpable masses. I did not appreciate hepatosplenomegaly. There were normal bowel sounds. MUSCULOSKELETAL:  Symmetrical muscle tone appreciated in all four extremities.    SKIN: Skin turgor is normal. No pathologic skin lesions appreciated.  NEUROLOGIC:  Motor and sensation appear grossly normal.  Cranial nerves are grossly without defect. PSYCH:  Alert and oriented to person, place and time. Affect is appropriate for situation.  Data Reviewed I have personally reviewed what is currently available of the patient's imaging, recent labs and medical records.   Labs:     Latest Ref Rng & Units 08/10/2022   10:23 AM 08/08/2022    11:11 AM 08/07/2022   11:26 PM  CBC  WBC 4.0 - 10.5 K/uL 7.9  7.2  7.6   Hemoglobin 13.0 - 17.0 g/dL 8.2  8.7  8.1   Hematocrit 39.0 - 52.0 % 26.0  26.7  25.2   Platelets 150 - 400 K/uL 172  204  205       Latest Ref Rng & Units 08/10/2022   10:23 AM 08/08/2022   11:11 AM 08/07/2022    6:04 AM  CMP  Glucose 70 - 99 mg/dL 174  100  113   BUN 8 - 23 mg/dL 63  26  67   Creatinine 0.61 - 1.24 mg/dL 5.34  2.47  4.89   Sodium 135 - 145 mmol/L 136  135  136   Potassium 3.5 - 5.1 mmol/L 3.9  3.5  4.4   Chloride 98 - 111 mmol/L 104  101  109   CO2 22 - 32 mmol/L 22  27  23    Calcium 8.9 - 10.3 mg/dL 8.9  8.9  8.7       Imaging: Radiological images reviewed:   Within last 24 hrs: No results found.  Assessment    Candidate for CAPD catheter placement, desiring home peritoneal dialysis. Patient Active Problem List   Diagnosis Date Noted   Anemia due to chronic kidney disease 08/07/2022   NSTEMI (non-ST elevated myocardial infarction) ([place]) 08/05/2022   DM2 (diabetes mellitus, type 2) ([place]) 08/05/2022   HTN (hypertension) 08/05/2022   BMI 35.0-35.9,adult 05/19/2022   Chronic kidney disease, stage 5 ([place]) 02/23/2020   Type 2 diabetes mellitus ([place]) 06/26/2017   Diabetic foot infection ([place]) 03/12/2017   Lower extremity ulceration ([place]) 03/01/2017   Bilateral lower extremity edema 03/01/2017    Plan    Laparoscopic CAPD catheter placement.  Probable omentopexy  We will need to hold Plavix preop, 5 days ideal, if acceptable by Cardiology.    Risks include but are not limited to anesthesia, bleeding, catheter dysfunction, catheter infection.  Exit site infection, catheter migration etc.  I believe patient understands these are not all inclusive and desires to proceed.  Understanding alternative options for treating his renal failure.  Questions answered, no guarantees ever expressed or implied.   Face-to-face time spent with the patient and accompanying care providers(if  present) was 40 minutes, with more than 50% of the time spent counseling, educating, and coordinating care of the patient.    These notes generated with voice recognition software. I apologize for typographical errors.  Ronny Bacon M.D., FACS 08/20/2022, 3:38 PM

## 2022-08-20 ENCOUNTER — Other Ambulatory Visit: Payer: Self-pay

## 2022-08-20 ENCOUNTER — Encounter: Payer: Self-pay | Admitting: Surgery

## 2022-08-20 ENCOUNTER — Ambulatory Visit (INDEPENDENT_AMBULATORY_CARE_PROVIDER_SITE_OTHER): Payer: PPO | Admitting: Surgery

## 2022-08-20 ENCOUNTER — Telehealth: Payer: Self-pay

## 2022-08-20 VITALS — BP 129/61 | HR 65 | Temp 98.5°F | Ht 73.0 in | Wt 268.0 lb

## 2022-08-20 DIAGNOSIS — N185 Chronic kidney disease, stage 5: Secondary | ICD-10-CM | POA: Diagnosis not present

## 2022-08-20 NOTE — Patient Instructions (Addendum)
Our surgery scheduler will call you within 24-48 hours. Please have the [place] surgery sheet available when speaking with her.  Cardiac Clearance faxed to Dr.Dwayne Callwood at this time.

## 2022-08-20 NOTE — Telephone Encounter (Signed)
Cardiac Clearance faxed to Dr.Dwayne Callwood at this time.

## 2022-08-21 ENCOUNTER — Telehealth: Payer: Self-pay | Admitting: Surgery

## 2022-08-21 NOTE — Telephone Encounter (Signed)
Patient has been advised of Pre-Admission date/time, and Surgery date at [facility].  Surgery Date: 08/28/22 Preadmission Testing Date: 08/27/22 (phone 8a-1p)  Patient has been made aware to call [phone], between 1-3:00pm the day before surgery, to find out what time to arrive for surgery.

## 2022-08-27 ENCOUNTER — Inpatient Hospital Stay: Admission: RE | Admit: 2022-08-27 | Payer: PPO | Source: Ambulatory Visit

## 2022-08-27 ENCOUNTER — Telehealth: Payer: Self-pay | Admitting: Surgery

## 2022-08-27 NOTE — Telephone Encounter (Signed)
Updated information regarding rescheduled surgery.   Patient has been advised of Pre-Admission date/time, and Surgery date at [facility].  Surgery Date: 09/16/22 Preadmission Testing Date: 09/09/22 (phone 1p-5p)  Patient has been made aware to call [phone], between 1-3:00pm the day before surgery, to find out what time to arrive for surgery.    Patient states will also stop his Plavix 5 days prior to his surgery as he did before.

## 2022-08-28 NOTE — Progress Notes (Signed)
Cardiac Clearance has been received from Dr [facility] office. The patient is cleared at Low risk.

## 2022-09-01 ENCOUNTER — Telehealth (INDEPENDENT_AMBULATORY_CARE_PROVIDER_SITE_OTHER): Payer: Self-pay

## 2022-09-01 NOTE — Telephone Encounter (Signed)
Spoke with the patient and he stated that he felt fine and didn't need to have anything placed. Patient stated he was getting a permanent access on 09/16/22 to do dialysis at home. I advised the patient to contact his Nephrologist and speak with them and call me back if he is to be scheduled.

## 2022-09-01 NOTE — Telephone Encounter (Signed)
LVM for Medical assistant to Dr. Candiss Norse to advise if they are wanting a perm cath prior to the PD cath on 11.29.23. I will fax also. Awaiting return call.

## 2022-09-03 NOTE — Telephone Encounter (Signed)
Received VM from [facility] with [facility] stating that patient has discussed the insertion of perm cath with his providers and he does not want to move forward with this. States that he will wait for his surgery on 11.29.23 for the PD cath. Jamie's # at [place] is [phone] ext [phone] if there are any questions. Nothing further is needed at this time.

## 2022-09-09 ENCOUNTER — Encounter
Admission: RE | Admit: 2022-09-09 | Discharge: 2022-09-09 | Disposition: A | Discharge status: Home / Self Care | Payer: PPO | Source: Ambulatory Visit | Attending: Surgery | Admitting: Surgery

## 2022-09-09 ENCOUNTER — Ambulatory Visit: Payer: Self-pay | Admitting: Surgery

## 2022-09-09 DIAGNOSIS — Z01818 Encounter for other preprocedural examination: Secondary | ICD-10-CM

## 2022-09-09 DIAGNOSIS — N185 Chronic kidney disease, stage 5: Secondary | ICD-10-CM

## 2022-09-09 HISTORY — DX: Anemia, unspecified: D64.9

## 2022-09-09 HISTORY — DX: Type 2 diabetes mellitus without complications: E11.9

## 2022-09-09 HISTORY — DX: Localized edema: R60.0

## 2022-09-09 NOTE — Patient Instructions (Addendum)
Your procedure is scheduled on:09-16-22 Wednesday Report to the Registration Desk on the 1st floor of the [place].Then proceed to the 2nd floor Surgery Desk To find out your arrival time, please call [phone] between 1PM - 3PM on:09-15-22 Tuesday If your arrival time is 6:00 am, do not arrive prior to that time as the [place] entrance doors do not open until 6:00 am.  REMEMBER: Instructions that are not followed completely may result in serious medical risk, up to and including death; or upon the discretion of your surgeon and anesthesiologist your surgery may need to be rescheduled.  Do not eat food OR drink any liquids after midnight the night before surgery.  No gum chewing, lozengers or hard candies.  TAKE THESE MEDICATIONS THE MORNING OF SURGERY WITH A SIP OF WATER: -:amLODipine (NORVASC)  -carvedilol (COREG)  -cloNIDine (CATAPRES)  -fenofibrate (TRICOR)  -hydrALAZINE (APRESOLINE)  -isosorbide mononitrate (IMDUR)  -oxyCODONE-acetaminophen (PERCOCET/ROXICET)   Stop your clopidogrel (PLAVIX) 5 days prior to surgery as instructed by Dr Norville Haggard dose will be on 09-10-22 Thursday  Continue your 81 mg Aspirin up until the day prior to surgery-Do NOT take the morning of surgery  One week prior to surgery: Stop Anti-inflammatories (NSAIDS) such as Advil, Aleve, Ibuprofen, Motrin, Naproxen, Naprosyn and Aspirin based products such as Excedrin, Goodys Powder, BC Powder.You may however, continue to take Tylenol/Percocet if needed for pain up until the day of surgery.  Stop ANY OVER THE COUNTER supplements/vitamins NOW (09-09-22) until after surgery (Vitamin B12, D3 and Multivitamin)-Continue your Ferrous Gluconate   No Alcohol for 24 hours before or after surgery.  No Smoking including e-cigarettes for 24 hours prior to surgery.  No chewable tobacco products for at least 6 hours prior to surgery.  No nicotine patches on the day of surgery.  Do not use any  "recreational" drugs for at least a week prior to your surgery.  Please be advised that the combination of cocaine and anesthesia may have negative outcomes, up to and including death. If you test positive for cocaine, your surgery will be cancelled.  On the morning of surgery brush your teeth with toothpaste and water, you may rinse your mouth with mouthwash if you wish. Do not swallow any toothpaste or mouthwash.  Use CHG Soap as directed on instruction sheet.  Do not wear jewelry, make-up, hairpins, clips or nail polish.  Do not wear lotions, powders, or perfumes.   Do not shave body from the neck down 48 hours prior to surgery just in case you cut yourself which could leave a site for infection.  Also, freshly shaved skin may become irritated if using the CHG soap.  Contact lenses, hearing aids and dentures may not be worn into surgery.  Do not bring valuables to the hospital. [facility] is not responsible for any missing/lost belongings or valuables.   Notify your doctor if there is any change in your medical condition (cold, fever, infection).  Wear comfortable clothing (specific to your surgery type) to the hospital.  After surgery, you can help prevent lung complications by doing breathing exercises.  Take deep breaths and cough every 1-2 hours. Your doctor may order a device called an Incentive Spirometer to help you take deep breaths. When coughing or sneezing, hold a pillow firmly against your incision with both hands. This is called "splinting." Doing this helps protect your incision. It also decreases belly discomfort.  If you are being admitted to the hospital overnight, leave your suitcase in the car.  After surgery it may be brought to your room.  If you are being discharged the day of surgery, you will not be allowed to drive home. You will need a responsible adult (18 years or older) to drive you home and stay with you that night.   If you are taking public  transportation, you will need to have a responsible adult (18 years or older) with you. Please confirm with your physician that it is acceptable to use public transportation.   Please call the [place] Dept. at [phone] if you have any questions about these instructions.  Surgery Visitation Policy:  Patients undergoing a surgery or procedure may have two family members or support persons with them as long as the person is not COVID-19 positive or experiencing its symptoms.   MASKING: Due to an increase in RSV rates and hospitalizations, starting Wednesday, Nov. 15, in patient care areas in which we serve newborns, infants and children, masks will be required for teammates and visitors.  Children ages 30 and under may not visit. This policy affects the following departments only:  [place] Postpartum area Mother Baby Unit Newborn nursery/Special care nursery  Other areas: Masks continue to be strongly recommended for [place] teammates, visitors and patients in all other areas. Visitation is not restricted outside of the units listed above.

## 2022-09-14 NOTE — Progress Notes (Signed)
Cardiac clearance from Dr. Clayborn Bigness received and on the chart. Risk assessment as low and optimized for surgery scheduled 09/16/2022.

## 2022-09-16 ENCOUNTER — Ambulatory Visit: Payer: BC Managed Care – PPO | Admitting: Urgent Care

## 2022-09-16 ENCOUNTER — Encounter
Admission: RE | Disposition: A | Discharge status: Home / Self Care | Payer: Self-pay | Source: Home / Self Care | Attending: Surgery

## 2022-09-16 ENCOUNTER — Other Ambulatory Visit: Payer: Self-pay

## 2022-09-16 ENCOUNTER — Encounter: Payer: Self-pay | Admitting: Surgery

## 2022-09-16 ENCOUNTER — Ambulatory Visit
Admission: RE | Admit: 2022-09-16 | Discharge: 2022-09-16 | Disposition: A | Discharge status: Home / Self Care | Payer: BC Managed Care – PPO | Attending: Surgery | Admitting: Surgery

## 2022-09-16 ENCOUNTER — Ambulatory Visit: Payer: BC Managed Care – PPO | Admitting: Anesthesiology

## 2022-09-16 DIAGNOSIS — Z992 Dependence on renal dialysis: Secondary | ICD-10-CM | POA: Diagnosis not present

## 2022-09-16 DIAGNOSIS — N186 End stage renal disease: Secondary | ICD-10-CM

## 2022-09-16 DIAGNOSIS — Z7902 Long term (current) use of antithrombotics/antiplatelets: Secondary | ICD-10-CM | POA: Diagnosis not present

## 2022-09-16 DIAGNOSIS — R0600 Dyspnea, unspecified: Secondary | ICD-10-CM | POA: Insufficient documentation

## 2022-09-16 DIAGNOSIS — E1122 Type 2 diabetes mellitus with diabetic chronic kidney disease: Secondary | ICD-10-CM | POA: Insufficient documentation

## 2022-09-16 DIAGNOSIS — N185 Chronic kidney disease, stage 5: Secondary | ICD-10-CM

## 2022-09-16 DIAGNOSIS — I120 Hypertensive chronic kidney disease with stage 5 chronic kidney disease or end stage renal disease: Secondary | ICD-10-CM | POA: Insufficient documentation

## 2022-09-16 DIAGNOSIS — Z87891 Personal history of nicotine dependence: Secondary | ICD-10-CM | POA: Insufficient documentation

## 2022-09-16 DIAGNOSIS — I2510 Atherosclerotic heart disease of native coronary artery without angina pectoris: Secondary | ICD-10-CM | POA: Insufficient documentation

## 2022-09-16 DIAGNOSIS — I252 Old myocardial infarction: Secondary | ICD-10-CM | POA: Insufficient documentation

## 2022-09-16 DIAGNOSIS — Z01818 Encounter for other preprocedural examination: Secondary | ICD-10-CM

## 2022-09-16 HISTORY — PX: CAPD INSERTION: SHX5233

## 2022-09-16 LAB — CBC WITH DIFFERENTIAL/PLATELET
Abs Immature Granulocytes: 0.02 10*3/uL (ref 0.00–0.07)
Basophils Absolute: 0.1 10*3/uL (ref 0.0–0.1)
Basophils Relative: 1 %
Eosinophils Absolute: 0.4 10*3/uL (ref 0.0–0.5)
Eosinophils Relative: 6 %
HCT: 28.8 % — ABNORMAL LOW (ref 39.0–52.0)
Hemoglobin: 9.3 g/dL — ABNORMAL LOW (ref 13.0–17.0)
Immature Granulocytes: 0 %
Lymphocytes Relative: 17 %
Lymphs Abs: 1.1 10*3/uL (ref 0.7–4.0)
MCH: 27.8 pg (ref 26.0–34.0)
MCHC: 32.3 g/dL (ref 30.0–36.0)
MCV: 86.2 fL (ref 80.0–100.0)
Monocytes Absolute: 0.6 10*3/uL (ref 0.1–1.0)
Monocytes Relative: 9 %
Neutro Abs: 4 10*3/uL (ref 1.7–7.7)
Neutrophils Relative %: 67 %
Platelets: 191 10*3/uL (ref 150–400)
RBC: 3.34 MIL/uL — ABNORMAL LOW (ref 4.22–5.81)
RDW: 15.6 % — ABNORMAL HIGH (ref 11.5–15.5)
WBC: 6.1 10*3/uL (ref 4.0–10.5)
nRBC: 0 % (ref 0.0–0.2)

## 2022-09-16 LAB — BASIC METABOLIC PANEL
Anion gap: 8 (ref 5–15)
BUN: 49 mg/dL — ABNORMAL HIGH (ref 8–23)
CO2: 24 mmol/L (ref 22–32)
Calcium: 8.8 mg/dL — ABNORMAL LOW (ref 8.9–10.3)
Chloride: 111 mmol/L (ref 98–111)
Creatinine, Ser: 4.5 mg/dL — ABNORMAL HIGH (ref 0.61–1.24)
GFR, Estimated: 13 mL/min — ABNORMAL LOW (ref >60)
Glucose, Bld: 122 mg/dL — ABNORMAL HIGH (ref 70–99)
Potassium: 3.9 mmol/L (ref 3.5–5.1)
Sodium: 143 mmol/L (ref 135–145)

## 2022-09-16 LAB — GLUCOSE, CAPILLARY
Glucose-Capillary: 115 mg/dL — ABNORMAL HIGH (ref 70–99)
Glucose-Capillary: 124 mg/dL — ABNORMAL HIGH (ref 70–99)

## 2022-09-16 SURGERY — LAPAROSCOPIC INSERTION CONTINUOUS AMBULATORY PERITONEAL DIALYSIS  (CAPD) CATHETER
Anesthesia: General

## 2022-09-16 MED ORDER — FENTANYL CITRATE (PF) 100 MCG/2ML IJ SOLN: 25.0000 ug | INTRAMUSCULAR | Status: DC | PRN

## 2022-09-16 MED ORDER — CHLORHEXIDINE GLUCONATE CLOTH 2 % EX PADS: 6.0000 | MEDICATED_PAD | Freq: Once | CUTANEOUS | Status: DC

## 2022-09-16 MED ORDER — BUPIVACAINE-EPINEPHRINE (PF) 0.25% -1:200000 IJ SOLN
INTRAMUSCULAR | Status: DC | PRN
  Administered 2022-09-16: 15 mL

## 2022-09-16 MED ORDER — CHLORHEXIDINE GLUCONATE 0.12 % MT SOLN
OROMUCOSAL | Status: AC
  Administered 2022-09-16: 15 mL via OROMUCOSAL
  Filled 2022-09-16: qty 15

## 2022-09-16 MED ORDER — PROPOFOL 10 MG/ML IV BOLUS
INTRAVENOUS | Status: DC | PRN
  Administered 2022-09-16: 130 mg via INTRAVENOUS

## 2022-09-16 MED ORDER — FAMOTIDINE 20 MG PO TABS: 20.0000 mg | ORAL_TABLET | Freq: Once | ORAL | Status: AC

## 2022-09-16 MED ORDER — FENTANYL CITRATE (PF) 100 MCG/2ML IJ SOLN
INTRAMUSCULAR | Status: DC | PRN
  Administered 2022-09-16: 100 ug via INTRAVENOUS

## 2022-09-16 MED ORDER — BUPIVACAINE LIPOSOME 1.3 % IJ SUSP: 20.0000 mL | Freq: Once | INTRAMUSCULAR | Status: DC

## 2022-09-16 MED ORDER — SUGAMMADEX SODIUM 200 MG/2ML IV SOLN
INTRAVENOUS | Status: DC | PRN
  Administered 2022-09-16: 300 mg via INTRAVENOUS

## 2022-09-16 MED ORDER — FENTANYL CITRATE (PF) 100 MCG/2ML IJ SOLN
INTRAMUSCULAR | Status: AC
  Filled 2022-09-16: qty 2

## 2022-09-16 MED ORDER — ACETAMINOPHEN 500 MG PO TABS: 1000.0000 mg | ORAL_TABLET | ORAL | Status: AC

## 2022-09-16 MED ORDER — OXYCODONE HCL 5 MG PO TABS: 5.0000 mg | ORAL_TABLET | Freq: Four times a day (QID) | ORAL | 0 refills | Status: DC | PRN

## 2022-09-16 MED ORDER — CHLORHEXIDINE GLUCONATE 0.12 % MT SOLN: 15.0000 mL | Freq: Once | OROMUCOSAL | Status: AC

## 2022-09-16 MED ORDER — OXYCODONE HCL 5 MG PO TABS
ORAL_TABLET | ORAL | Status: AC
  Administered 2022-09-16: 5 mg via ORAL
  Filled 2022-09-16: qty 1

## 2022-09-16 MED ORDER — SODIUM CHLORIDE 0.9 % IV SOLN: INTRAVENOUS | Status: DC

## 2022-09-16 MED ORDER — GABAPENTIN 300 MG PO CAPS: 300.0000 mg | ORAL_CAPSULE | ORAL | Status: AC

## 2022-09-16 MED ORDER — EPHEDRINE SULFATE (PRESSORS) 50 MG/ML IJ SOLN
INTRAMUSCULAR | Status: DC | PRN
  Administered 2022-09-16: 5 mg via INTRAVENOUS
  Administered 2022-09-16: 10 mg via INTRAVENOUS

## 2022-09-16 MED ORDER — PHENYLEPHRINE 80 MCG/ML (10ML) SYRINGE FOR IV PUSH (FOR BLOOD PRESSURE SUPPORT)
PREFILLED_SYRINGE | INTRAVENOUS | Status: DC | PRN
  Administered 2022-09-16: 160 ug via INTRAVENOUS

## 2022-09-16 MED ORDER — BUPIVACAINE-EPINEPHRINE (PF) 0.25% -1:200000 IJ SOLN
INTRAMUSCULAR | Status: AC
  Filled 2022-09-16: qty 30

## 2022-09-16 MED ORDER — GABAPENTIN 300 MG PO CAPS
ORAL_CAPSULE | ORAL | Status: AC
  Administered 2022-09-16: 300 mg via ORAL
  Filled 2022-09-16: qty 1

## 2022-09-16 MED ORDER — CHLORHEXIDINE GLUCONATE 0.12 % MT SOLN
OROMUCOSAL | Status: AC
  Filled 2022-09-16: qty 15

## 2022-09-16 MED ORDER — CEFAZOLIN SODIUM-DEXTROSE 2-4 GM/100ML-% IV SOLN
INTRAVENOUS | Status: AC
  Filled 2022-09-16: qty 100

## 2022-09-16 MED ORDER — CELECOXIB 200 MG PO CAPS
ORAL_CAPSULE | ORAL | Status: AC
  Administered 2022-09-16: 200 mg via ORAL
  Filled 2022-09-16: qty 1

## 2022-09-16 MED ORDER — ORAL CARE MOUTH RINSE: 15.0000 mL | Freq: Once | OROMUCOSAL | Status: AC

## 2022-09-16 MED ORDER — ONDANSETRON HCL 4 MG/2ML IJ SOLN
INTRAMUSCULAR | Status: DC | PRN
  Administered 2022-09-16: 4 mg via INTRAVENOUS

## 2022-09-16 MED ORDER — OXYCODONE HCL 5 MG PO TABS: 5.0000 mg | ORAL_TABLET | Freq: Once | ORAL | Status: AC | PRN

## 2022-09-16 MED ORDER — 0.9 % SODIUM CHLORIDE (POUR BTL) OPTIME
TOPICAL | Status: DC | PRN
  Administered 2022-09-16: 250 mL

## 2022-09-16 MED ORDER — OXYCODONE HCL 5 MG/5ML PO SOLN: 5.0000 mg | Freq: Once | ORAL | Status: AC | PRN

## 2022-09-16 MED ORDER — FAMOTIDINE 20 MG PO TABS
ORAL_TABLET | ORAL | Status: AC
  Administered 2022-09-16: 20 mg via ORAL
  Filled 2022-09-16: qty 1

## 2022-09-16 MED ORDER — ROCURONIUM BROMIDE 100 MG/10ML IV SOLN
INTRAVENOUS | Status: DC | PRN
  Administered 2022-09-16: 50 mg via INTRAVENOUS

## 2022-09-16 MED ORDER — CELECOXIB 200 MG PO CAPS: 200.0000 mg | ORAL_CAPSULE | ORAL | Status: AC

## 2022-09-16 MED ORDER — LIDOCAINE HCL (CARDIAC) PF 100 MG/5ML IV SOSY
PREFILLED_SYRINGE | INTRAVENOUS | Status: DC | PRN
  Administered 2022-09-16: 100 mg via INTRAVENOUS

## 2022-09-16 MED ORDER — ACETAMINOPHEN 500 MG PO TABS
ORAL_TABLET | ORAL | Status: AC
  Administered 2022-09-16: 1000 mg via ORAL
  Filled 2022-09-16: qty 2

## 2022-09-16 MED ORDER — FENTANYL CITRATE (PF) 100 MCG/2ML IJ SOLN
INTRAMUSCULAR | Status: AC
  Administered 2022-09-16: 25 ug via INTRAVENOUS
  Filled 2022-09-16: qty 2

## 2022-09-16 MED ORDER — DEXAMETHASONE SODIUM PHOSPHATE 10 MG/ML IJ SOLN
INTRAMUSCULAR | Status: DC | PRN
  Administered 2022-09-16: 5 mg via INTRAVENOUS

## 2022-09-16 MED ORDER — CEFAZOLIN SODIUM-DEXTROSE 2-4 GM/100ML-% IV SOLN
2.0000 g | INTRAVENOUS | Status: AC
  Administered 2022-09-16: 2 g via INTRAVENOUS

## 2022-09-16 MED ORDER — PROPOFOL 10 MG/ML IV BOLUS
INTRAVENOUS | Status: AC
  Filled 2022-09-16: qty 20

## 2022-09-16 SURGICAL SUPPLY — 42 items
ADAPTER CATH DIALYSIS 4X8 IT L (MISCELLANEOUS) ×1 IMPLANT
ADH SKN CLS APL DERMABOND .7 (GAUZE/BANDAGES/DRESSINGS) ×1
BIOPATCH WHT 1IN DISK W/4.0 H (GAUZE/BANDAGES/DRESSINGS) ×1 IMPLANT
BLADE CLIPPER SURG (BLADE) ×1 IMPLANT
CATH EXTENDED DIALYSIS (CATHETERS) ×1 IMPLANT
DERMABOND ADVANCED .7 DNX12 (GAUZE/BANDAGES/DRESSINGS) ×1 IMPLANT
ELECT REM PT RETURN 9FT ADLT (ELECTROSURGICAL) ×1
ELECTRODE REM PT RTRN 9FT ADLT (ELECTROSURGICAL) ×1 IMPLANT
GLOVE ORTHO TXT STRL SZ7.5 (GLOVE) ×1 IMPLANT
GOWN STRL REUS W/ TWL LRG LVL3 (GOWN DISPOSABLE) ×1 IMPLANT
GOWN STRL REUS W/ TWL XL LVL3 (GOWN DISPOSABLE) ×1 IMPLANT
GOWN STRL REUS W/TWL LRG LVL3 (GOWN DISPOSABLE) ×1
GOWN STRL REUS W/TWL XL LVL3 (GOWN DISPOSABLE) ×1
GRASPER SUT TROCAR 14GX15 (MISCELLANEOUS) IMPLANT
IV NS 1000ML (IV SOLUTION) ×1
IV NS 1000ML BAXH (IV SOLUTION) ×1 IMPLANT
KIT TURNOVER KIT A (KITS) ×1 IMPLANT
MANIFOLD NEPTUNE II (INSTRUMENTS) ×1 IMPLANT
MINICAP W/POVIDONE IODINE SOL (MISCELLANEOUS) ×1 IMPLANT
NDL INSUFFLATION 14GA 120MM (NEEDLE) IMPLANT
NEEDLE HYPO 22GX1.5 SAFETY (NEEDLE) ×1 IMPLANT
NEEDLE INSUFFLATION 14GA 120MM (NEEDLE) IMPLANT
NS IRRIG 500ML POUR BTL (IV SOLUTION) ×1 IMPLANT
PACK LAP CHOLECYSTECTOMY (MISCELLANEOUS) ×1 IMPLANT
SCISSORS METZENBAUM CVD 33 (INSTRUMENTS) IMPLANT
SET CYSTO W/LG BORE CLAMP LF (SET/KITS/TRAYS/PACK) ×1 IMPLANT
SET TRANSFER 6 W/TWIST CLAMP 5 (SET/KITS/TRAYS/PACK) ×1 IMPLANT
SET TUBE SMOKE EVAC HIGH FLOW (TUBING) ×1 IMPLANT
SPONGE DRAIN TRACH 4X4 STRL 2S (GAUZE/BANDAGES/DRESSINGS) ×1 IMPLANT
STYLET FALLER (MISCELLANEOUS) IMPLANT
STYLET FALLER MEDIONICS (MISCELLANEOUS) IMPLANT
SUT MNCRL 4-0 (SUTURE) ×1
SUT MNCRL 4-0 27XMFL (SUTURE) ×1
SUT PROLENE 0 CT 2 (SUTURE) ×1 IMPLANT
SUT VIC AB 3-0 SH 27 (SUTURE) ×1
SUT VIC AB 3-0 SH 27X BRD (SUTURE) ×1 IMPLANT
SUT VICRYL 0 TIES 12 18 (SUTURE) ×1 IMPLANT
SUT VICRYL 0 UR6 27IN ABS (SUTURE) IMPLANT
SUTURE MNCRL 4-0 27XMF (SUTURE) ×1 IMPLANT
SYS KII FIOS ACCESS ABD 5X100 (TROCAR) ×1
SYSTEM KII FIOS ACES ABD 5X100 (TROCAR) ×1 IMPLANT
TROCAR Z-THREAD OPTICAL 5X100M (TROCAR) ×2 IMPLANT

## 2022-09-16 NOTE — Op Note (Signed)
Laparoscopy, continuous ambulatory peritoneal dialysis catheter placement.  Pre-operative Diagnosis: Chronic renal failure.   Post-operative Diagnosis: same.   Surgeon: Ronny Bacon, M.D., [address] [facility]  Anesthesia: General endotracheal  Findings: Excellent dialysate flow, no omentopexy necessary due to RLQ adhesions, and short omentum.  Estimated Blood Loss: 10 mL         Specimens: None          Complications: none              Procedure Details  The patient was seen again in the Holding Room. The benefits, complications, treatment options, and expected outcomes were discussed with the patient. The risks of bleeding, infection, recurrence of symptoms, failure to resolve symptoms, unanticipated injury, prosthetic placement, prosthetic malfunction, prosthetic infection, any of which could require further surgery were reviewed with the patient. The likelihood of improving the patient's symptoms/condition is good.  The patient and/or family concurred with the proposed plan, giving informed consent.  The patient was taken to Operating Room, identified and the procedure verified.    Prior to the induction of general anesthesia, antibiotic prophylaxis was administered. VTE prophylaxis was in place.  General anesthesia was then administered and tolerated well. After the induction, the patient was positioned in the supine position and the abdomen was prepped with Chloraprep and draped in the sterile fashion.  A Time Out was held and the above information confirmed. After local infiltration of quarter percent Marcaine with epinephrine, stab incision was made left upper quadrant.  Just below the costal margin approximately midclavicular line the Veress needle is passed with sensation of the layers to penetrate the abdominal wall and into the peritoneum.  Saline drop test is confirmed peritoneal placement.  Insufflation is initiated with carbon dioxide to pressures of 15 mmHg. An optical 5 mm trocar was  placed under direct visualization in the LEFT abdominal wall.  A second trocar was placed under direct visualization inferiorly on the LEFT side.  A few adhesions to the prior colostomy site were lysed sharply.  Evaluation of the omentum was done, it was scarred to the RLQ, and short, confirmed not to reach the pelvis.  With local infiltration of quarter percent Marcaine with epinephrine a transverse incision was made inferior to the umbilical level on the patient's right rectus.  This allowed the top of the coil to align with the symphysis pubis.   Utilizing a 5 mm applied optical port, I passed the trocar first through the anterior rectus sheath and then tangentially deep to the rectus muscle the maximal distance toward the symphysis prior to penetrating into the peritoneal cavity, all under direct visualization. I then placed the coil of the peritoneal dialysis catheter through this port into the pelvis and advanced the cuff under the anterior rectus sheath.  I then measured the angled catheter for the length to rise and make a lateral turn inferior to the Right costal margin, and then to extend laterally and somewhat inferiorly from that point.  These points were marked.   I made a counterincision where the catheter would arch/middle cuff would lie, cut the catheters after measuring and secured the catheters using the intraluminal connector with Prolene.   I then tunneled the catheter a 1/2 way point as the distance from the lowest to the counterincision was about twice that of the tunneler.  Ensuring the catheter remained without kinks or twists along the subcutaneous course.  I then tracked it in the same subcutaneous plane laterally parallel to the costal margin for skin  penetration with the trocar alone.  The catheter was then brought out through that site. We then evaluated the inflow of the dialysate and its gravitational drainage.  This was free flowing.  Once this was completed we then removed the  remaining trochars, along with the CO2, and closed the skin incisions with interrupted and running subcuticulars of 4-0 Monocryl, sealing them all with Dermabond. A biodisk was applied to the catheter exit site, and it was dressed with sponge secured, and the catheter with connectors and betadine cap  atop the primary dressing and covered with ABD.       Ronny Bacon M.D., [facility] 03/04/2021 9:13 AM

## 2022-09-16 NOTE — Interval H&P Note (Signed)
History and Physical Interval Note:  09/16/2022 12:34 PM  Lee Bryant.  has presented today for surgery, with the diagnosis of Chronic kidney disease stage 5.  The various methods of treatment have been discussed with the patient and family. After consideration of risks, benefits and other options for treatment, the patient has consented to  Procedure(s): LAPAROSCOPIC INSERTION CONTINUOUS AMBULATORY PERITONEAL DIALYSIS  (CAPD) CATHETER, possible omentopexy (N/A) as a surgical intervention.  The patient's history has been reviewed, patient examined, no change in status, stable for surgery.  I have reviewed the patient's chart and labs.  Questions were answered to the patient's satisfaction.     Ronny Bacon

## 2022-09-16 NOTE — Anesthesia Postprocedure Evaluation (Signed)
Anesthesia Post Note  Patient: Lee Bryant.  Procedure(s) Performed: LAPAROSCOPIC INSERTION CONTINUOUS AMBULATORY PERITONEAL DIALYSIS  (CAPD) CATHETER, possible omentopexy  Patient location during evaluation: PACU Anesthesia Type: General Level of consciousness: awake and alert Pain management: pain level controlled Vital Signs Assessment: post-procedure vital signs reviewed and stable Respiratory status: spontaneous breathing, nonlabored ventilation and respiratory function stable Cardiovascular status: blood pressure returned to baseline and stable Postop Assessment: no apparent nausea or vomiting Anesthetic complications: no   No notable events documented.   Last Vitals:  Vitals:   09/16/22 1732 09/16/22 1733  BP:  (!) 166/71  Pulse:  74  Resp:  20  Temp: (!) 36.1 C (!) 36.1 C  SpO2:  98%    Last Pain:  Vitals:   09/16/22 1733  TempSrc: Temporal  PainSc: 0-No pain                 Iran Ouch

## 2022-09-16 NOTE — Anesthesia Preprocedure Evaluation (Signed)
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 Denies any recent fever, cough, chills.    Pulmonary exam normal        Cardiovascular hypertension, On Medications (-) angina + CAD, + Past MI and + DOE  Normal cardiovascular exam  10/20: Patient underwent cardiac catheterization today which shows multivessel disease with 100% stenosis of RCA which is not amenable for any procedure.  70 to 90% stenosis of circumflex. No stent was placed.  IMPRESSIONS     1. Left ventricular ejection fraction, by estimation, is 50 to 55%. The  left ventricle has low normal function. The left ventricle demonstrates  global hypokinesis. The left ventricular internal cavity size was  moderately to severely dilated. There is  moderate concentric left ventricular hypertrophy. Left ventricular  diastolic parameters are consistent with Grade II diastolic dysfunction  (pseudonormalization).   2. Right ventricular systolic function is normal. The right ventricular  size is normal.   3. Left atrial size was moderately dilated.   4. Right atrial size was mild to moderately dilated.   5. The mitral valve is grossly normal. Mild mitral valve regurgitation.   6. The aortic valve is calcified. Aortic valve regurgitation is trivial.  Aortic valve sclerosis/calcification is present, without any evidence of  aortic stenosis.   7. Aortic dilatation noted. There is moderate dilatation of the aortic  root.       Neuro/Psych negative neurological ROS  negative  psych ROS   GI/Hepatic negative GI ROS, Neg liver ROS,,,  Endo/Other  negative endocrine ROSdiabetes    Renal/GU CRFRenal disease     Musculoskeletal   Abdominal   Peds  Hematology negative hematology ROS (+)   Anesthesia Other Findings Past Medical History: No date: Anemia No date: CAD (coronary artery disease)     Comment:  had abnormal nuclear stress test 2021 or 2022 No date: Cellulitis No date: CKD (chronic kidney disease), stage V (HCC) No date: Decubitus ulcer of sacral region, stage 4 (HCC)     Comment:  resolved - required multiple surgical interventions No date: DM (diabetes mellitus), type 2 (HCC) No date: HLD (hyperlipidemia) No date: Hypertension No date: Leg edema 07/2022: NSTEMI (non-ST elevated myocardial infarction) ([place]) No date: Obesity (BMI 30-39.9)  Past Surgical History: No date: COLOSTOMY No date: COLOSTOMY REVERSAL 2018: ducubitus ulcer debridement; N/A 08/07/2022: LEFT HEART CATH AND CORONARY ANGIOGRAPHY; N/A     Comment:  Procedure: LEFT HEART CATH AND CORONARY ANGIOGRAPHY;                Surgeon: Yolonda Kida, MD;  Location: [place]              CV LAB;  Service: Cardiovascular;  Laterality: N/A; 08/07/2022: TEMPORARY DIALYSIS CATHETER; Right     Comment:  Procedure: TEMPORARY DIALYSIS CATHETER;  Surgeon: Algernon Huxley, MD;  Location: [place] CV LAB;  Service:               Cardiovascular;  Laterality: Right;  Reproductive/Obstetrics negative OB ROS                              Anesthesia Physical Anesthesia Plan  ASA: 4  Anesthesia Plan: General ETT   Post-op Pain Management: Ofirmev IV (intra-op)*   Induction: Intravenous  PONV Risk Score and Plan: Ondansetron, Dexamethasone, Midazolam and Treatment may vary due to age or medical condition  Airway Management Planned: Oral ETT  Additional Equipment:   Intra-op Plan:   Post-operative Plan: Extubation in  OR  Informed Consent: I have reviewed the patients History and Physical, chart, labs and discussed the procedure including the risks, benefits and alternatives for the proposed anesthesia with the patient or authorized representative who has indicated his/her understanding and acceptance.     Dental Advisory Given  Plan Discussed with: Anesthesiologist, CRNA and Surgeon  Anesthesia Plan Comments: (Patient consented for risks of anesthesia including but not limited to:  - adverse reactions to medications - damage to eyes, teeth, lips or other oral mucosa - nerve damage due to positioning  - sore throat or hoarseness - Damage to heart, brain, nerves, lungs, other parts of body or loss of life  Patient voiced understanding.)         Anesthesia Quick Evaluation

## 2022-09-16 NOTE — Transfer of Care (Signed)
Immediate Anesthesia Transfer of Care Note  Patient: Lee Bryant.  Procedure(s) Performed: LAPAROSCOPIC INSERTION CONTINUOUS AMBULATORY PERITONEAL DIALYSIS  (CAPD) CATHETER, possible omentopexy  Patient Location: PACU  Anesthesia Type:General  Level of Consciousness: drowsy  Airway & Oxygen Therapy: Patient Spontanous Breathing and Patient connected to face mask oxygen  Post-op Assessment: Report given to RN and Post -op Vital signs reviewed and stable  Post vital signs: Reviewed and stable  Last Vitals:  Vitals Value Taken Time  BP    Temp    Pulse 68 09/16/22 1650  Resp 14 09/16/22 1650  SpO2 100 % 09/16/22 1650  Vitals shown include unvalidated device data.  Last Pain:  Vitals:   09/16/22 1640  TempSrc:   PainSc: Asleep         Complications: No notable events documented.

## 2022-09-16 NOTE — Anesthesia Procedure Notes (Signed)
Procedure Name: Intubation Date/Time: 09/16/2022 3:31 PM  Performed by: Johnna Acosta, CRNAPre-anesthesia Checklist: Patient identified, Emergency Drugs available, Suction available, Patient being monitored and Timeout performed Patient Re-evaluated:Patient Re-evaluated prior to induction Oxygen Delivery Method: Circle system utilized Preoxygenation: Pre-oxygenation with 100% oxygen Induction Type: IV induction Ventilation: Mask ventilation without difficulty and Oral airway inserted - appropriate to patient size Laryngoscope Size: McGraph and 3 Grade View: Grade II Tube type: Oral Tube size: 7.5 mm Number of attempts: 1 Airway Equipment and Method: Stylet and Video-laryngoscopy Placement Confirmation: ETT inserted through vocal cords under direct vision, positive ETCO2 and breath sounds checked- equal and bilateral Secured at: 21 cm Tube secured with: Tape Dental Injury: Teeth and Oropharynx as per pre-operative assessment

## 2022-09-16 NOTE — Discharge Instructions (Signed)

## 2022-09-17 ENCOUNTER — Encounter: Payer: Self-pay | Admitting: Surgery

## 2022-10-13 DIAGNOSIS — N186 End stage renal disease: Secondary | ICD-10-CM | POA: Insufficient documentation

## 2022-12-12 IMAGING — US US EXTREM LOW VENOUS
1 series · 14 of 24 positions shown · non-contrast
Comparison: None Available.

CLINICAL DATA: Hemoptysis

EXAM:
BILATERAL LOWER EXTREMITY VENOUS DOPPLER ULTRASOUND
TECHNIQUE: Gray-scale sonography with compression, as well as color and duplex
ultrasound, were performed to evaluate the deep venous system(s)
from the level of the common femoral vein through the popliteal and
proximal calf veins.

[Series 1: us venous img lower bilat (dvt) · portal-venous · 14 of 57 slices shown]
[im 1/57]
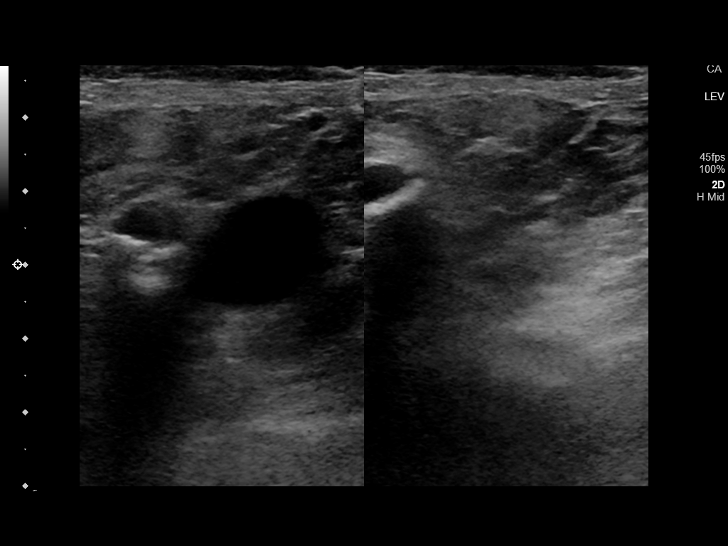
[im 5/57]
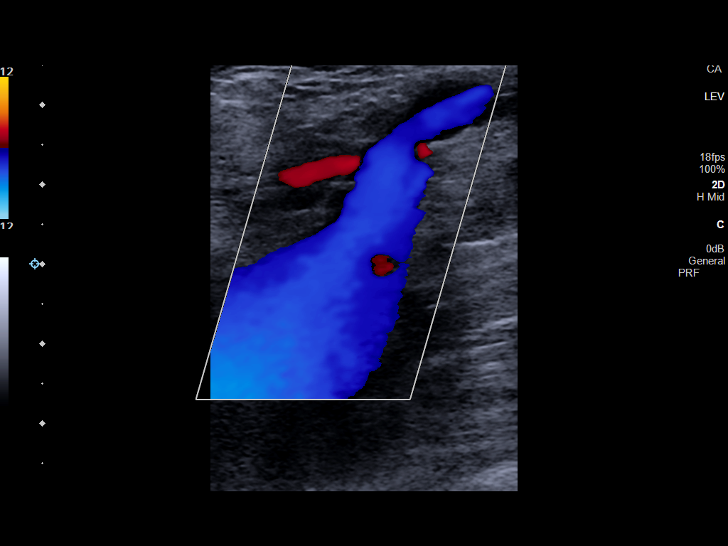
[im 10/57]
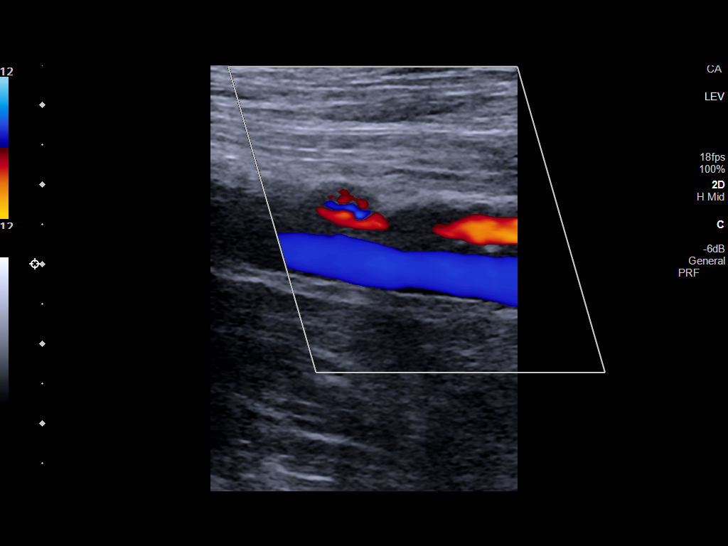
[im 15/57]
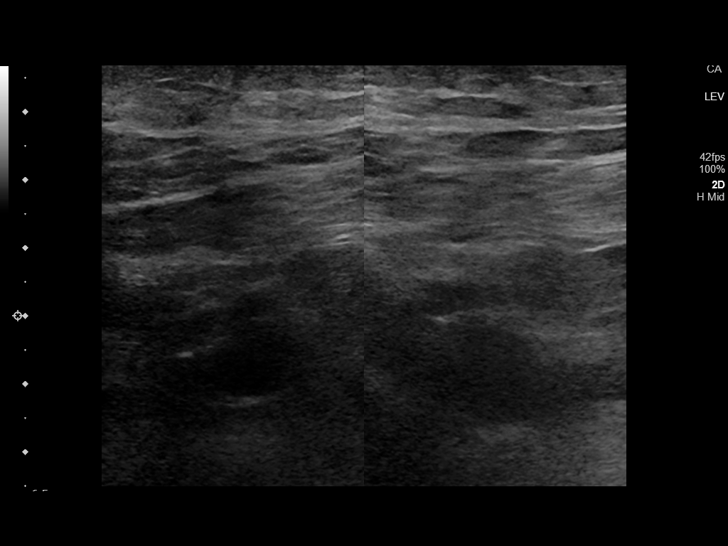
[im 18/57]
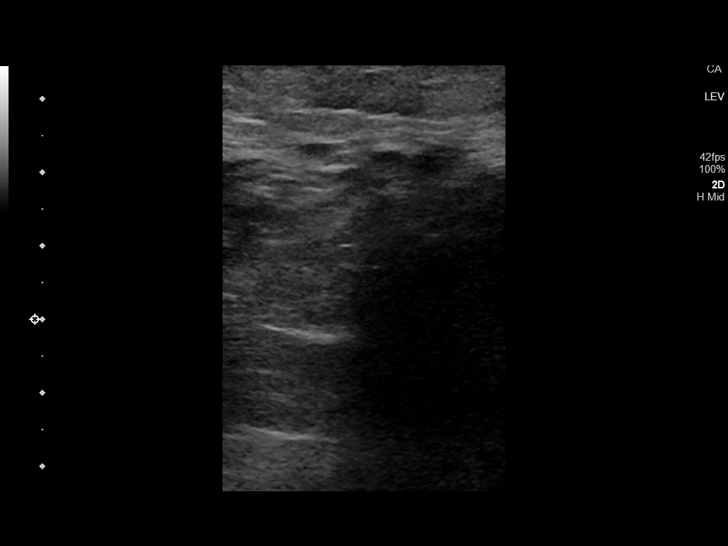
[im 22/57]
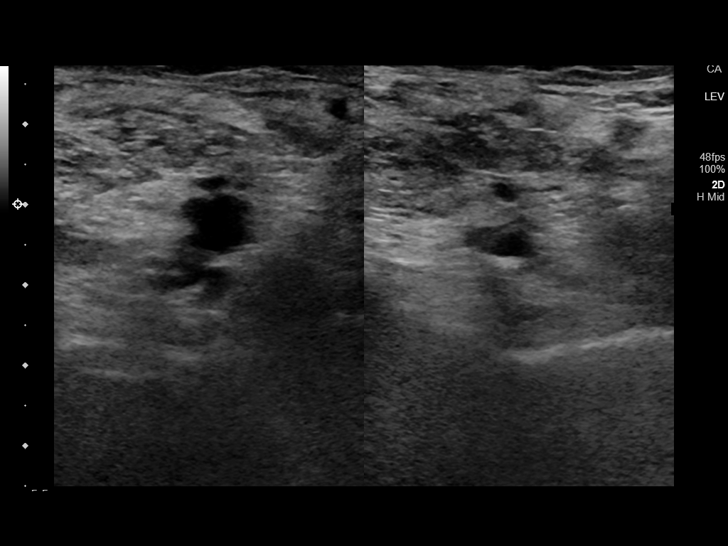
[im 27/57]
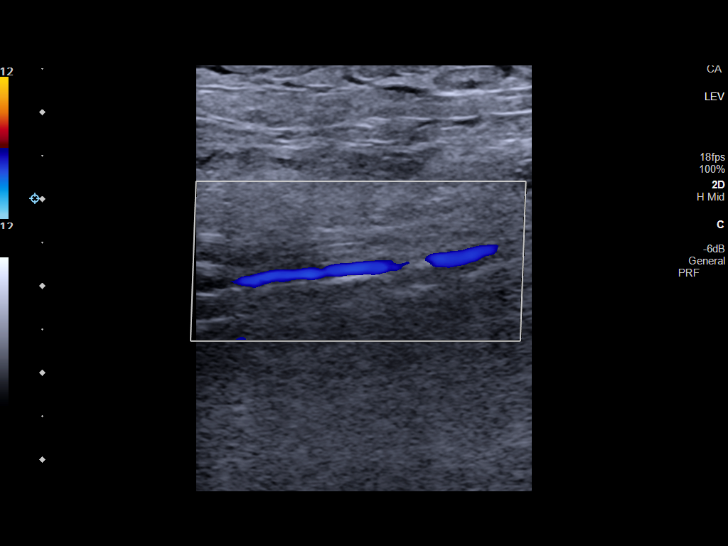
[im 30/57]
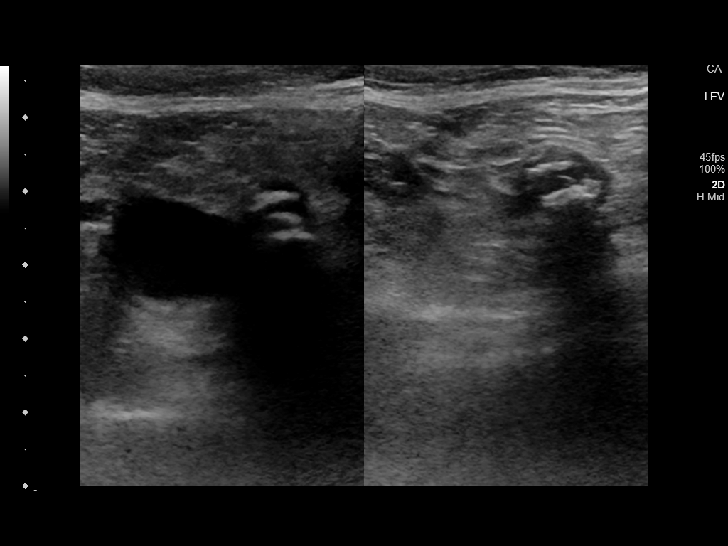
[im 35/57]
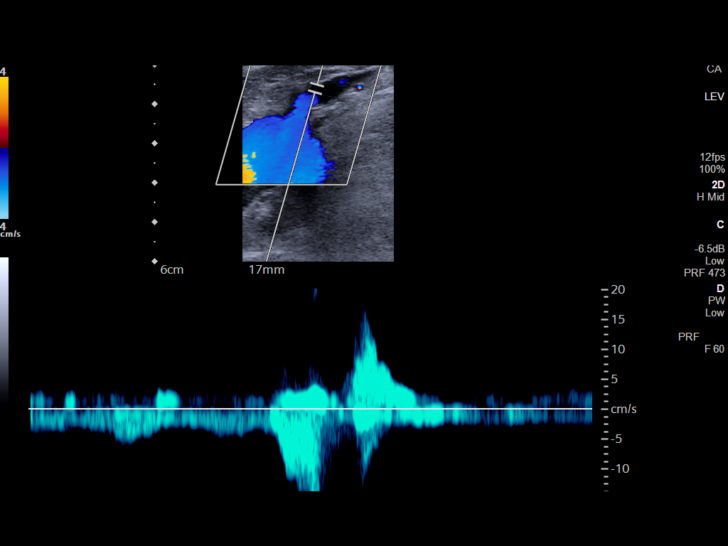
[im 39/57]
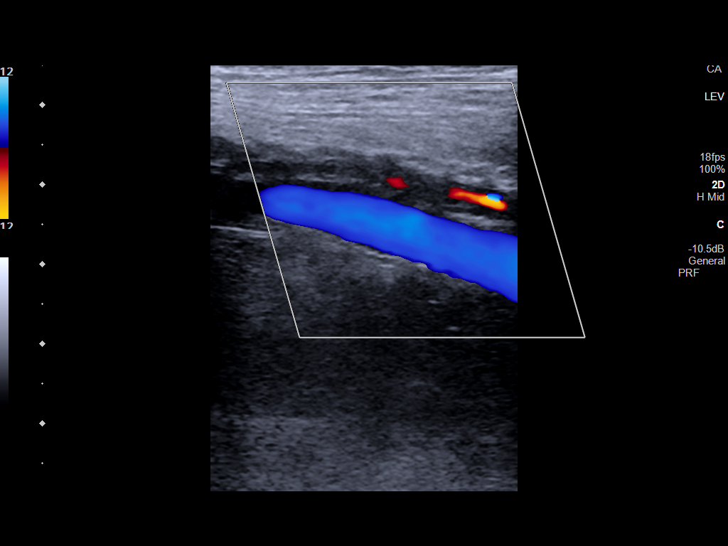
[im 44/57]
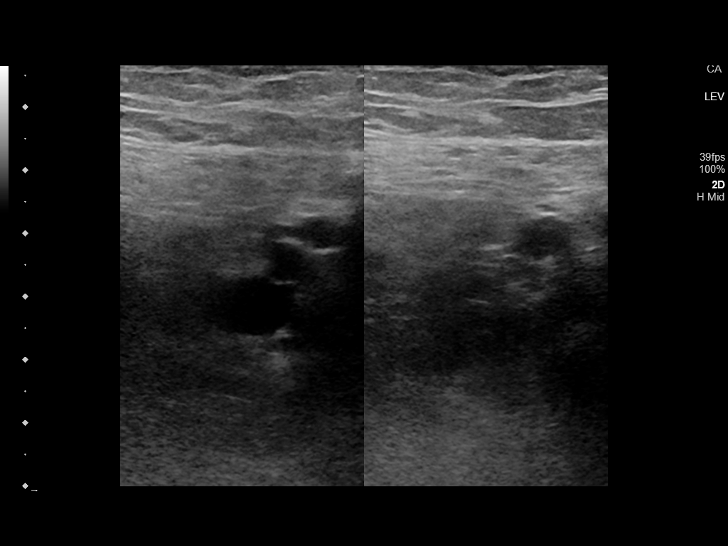
[im 47/57]
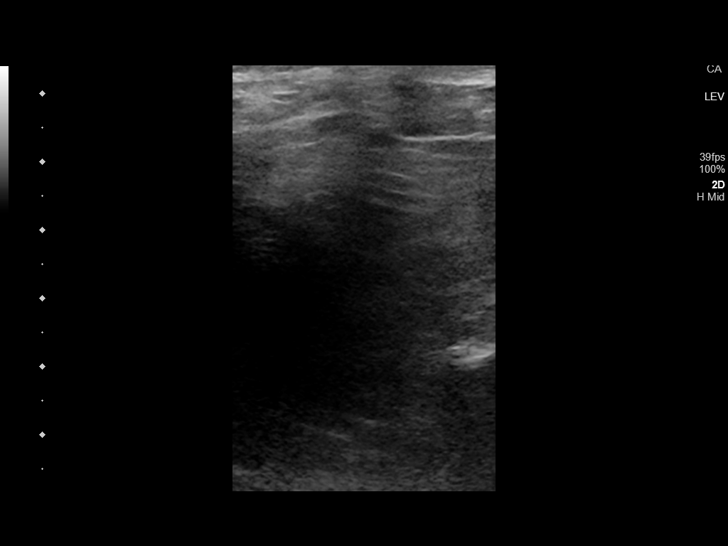
[im 52/57]
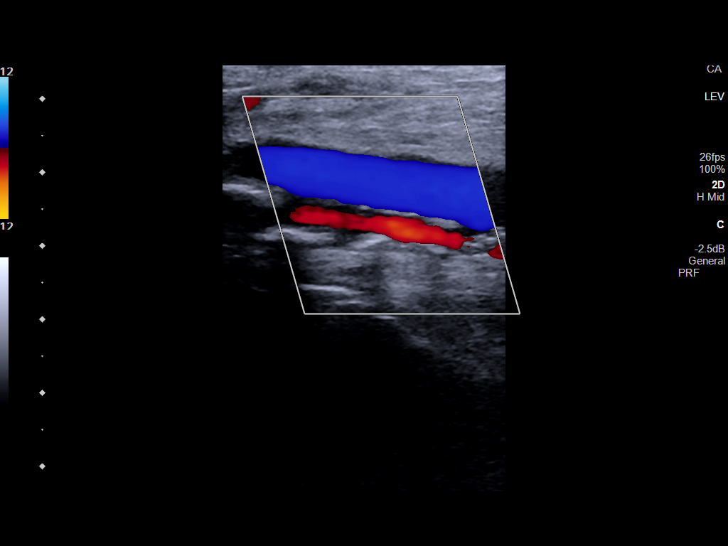
[im 57/57]
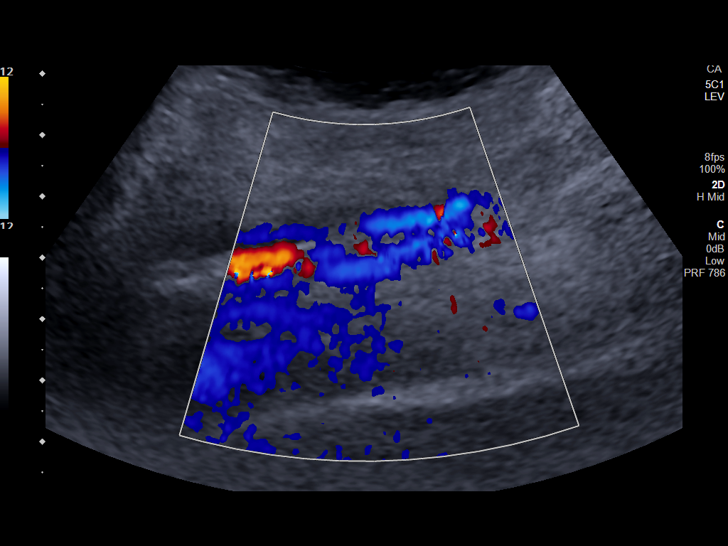

[14 of 24 positions shown; findings below may reference images not displayed]

FINDINGS: VENOUS

Normal compressibility of the common femoral, superficial femoral,
and popliteal veins, as well as the visualized calf veins.
Visualized portions of profunda femoral vein and great saphenous
vein unremarkable. No filling defects to suggest DVT on grayscale or
color Doppler imaging. Doppler waveforms show normal direction of
venous flow, normal respiratory plasticity and response to
augmentation.

Limited views of the contralateral common femoral vein are
unremarkable.

OTHER

Soft tissue edema of the right greater than left lower extremities.

Limitations: none
IMPRESSION: Negative.

## 2022-12-13 IMAGING — CT CT CHEST W/O CM
2 of 4 series · 15 of 36 positions shown, 18 images · non-contrast
Comparison: None Available.

CLINICAL DATA: Persistent cough, shortness of breath, hemoptysis



[Series 2: thorax · axial · 0.85mm/px · z∈[-630,-364]mm · 12 of 159 slices shown, 15 images]
[im 13/159  mediastinal]
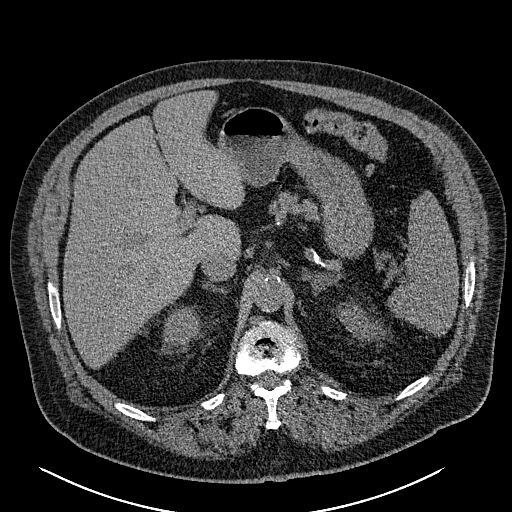
[im 13/159  lung]
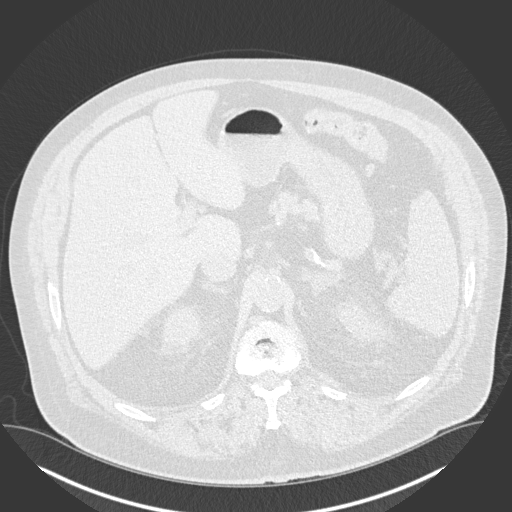
[im 25/159  lung]
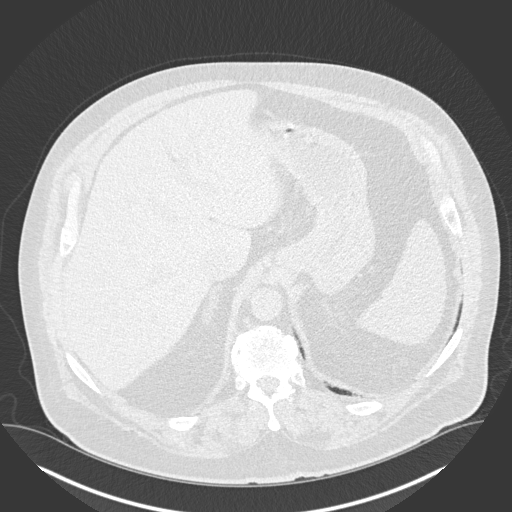
[im 37/159  lung]
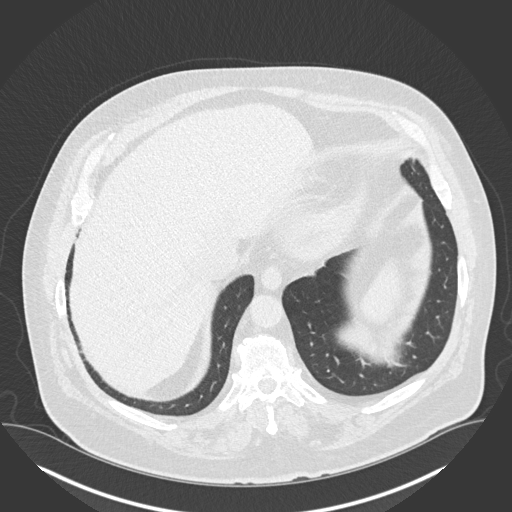
[im 49/159  lung]
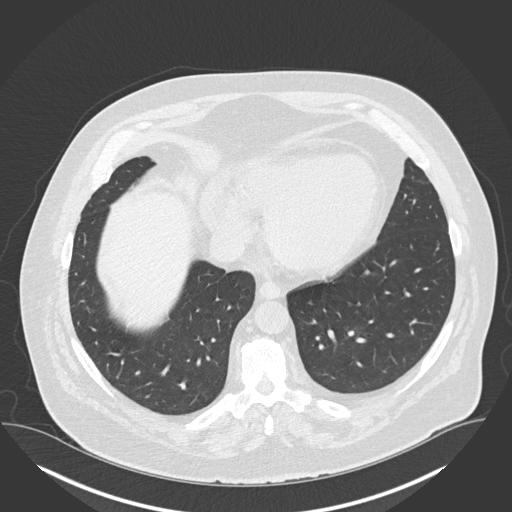
[im 61/159  mediastinal]
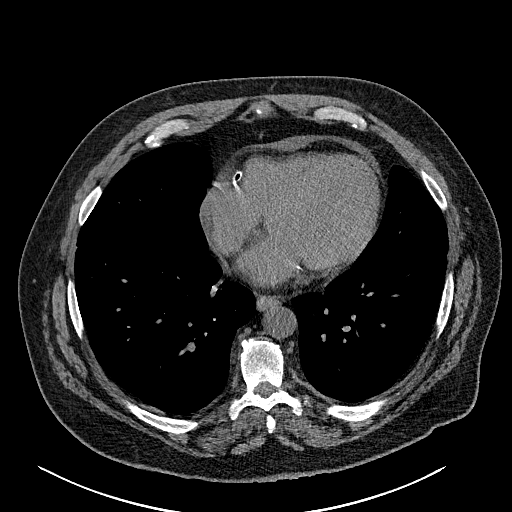
[im 61/159  lung]
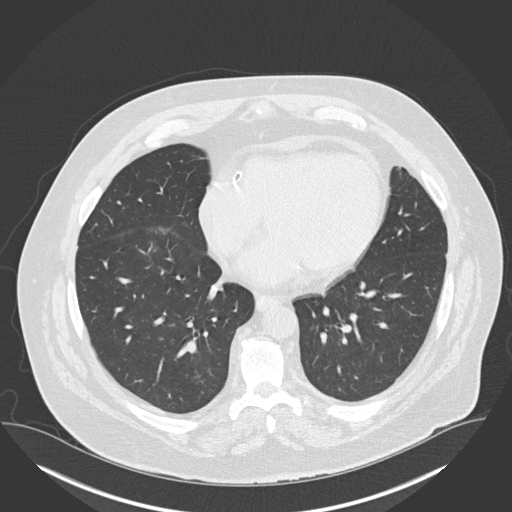
[im 73/159  lung]
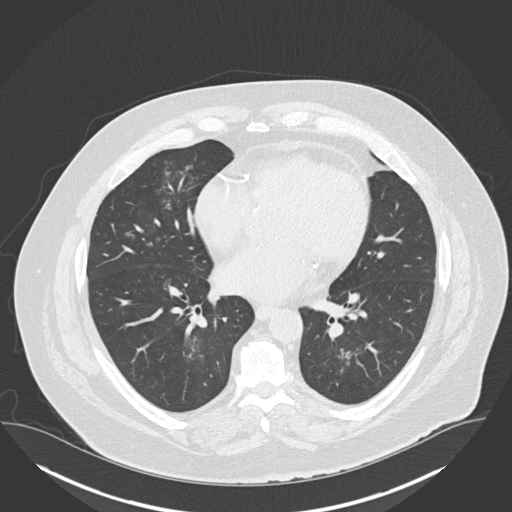
[im 86/159  lung]
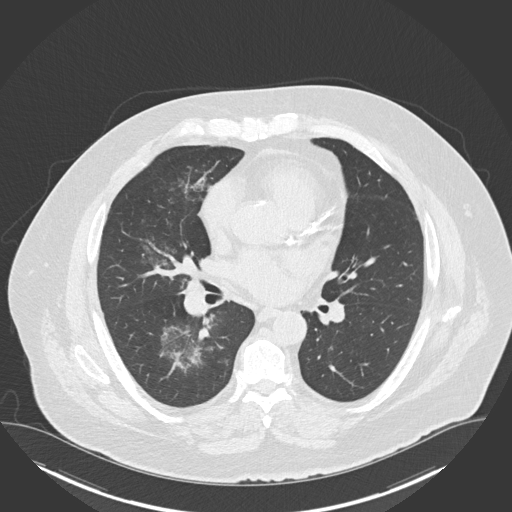
[im 98/159  lung]
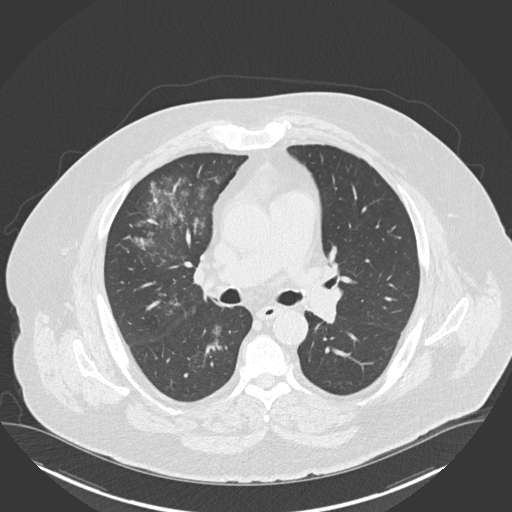
[im 110/159  mediastinal]
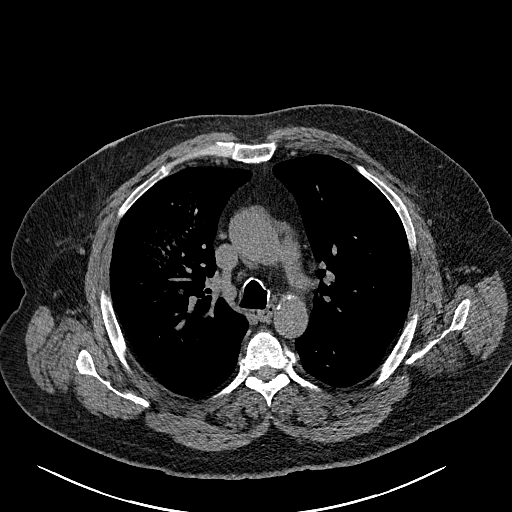
[im 110/159  lung]
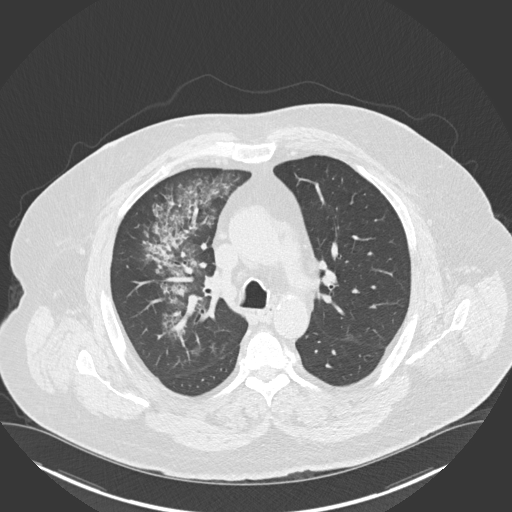
[im 122/159  lung]
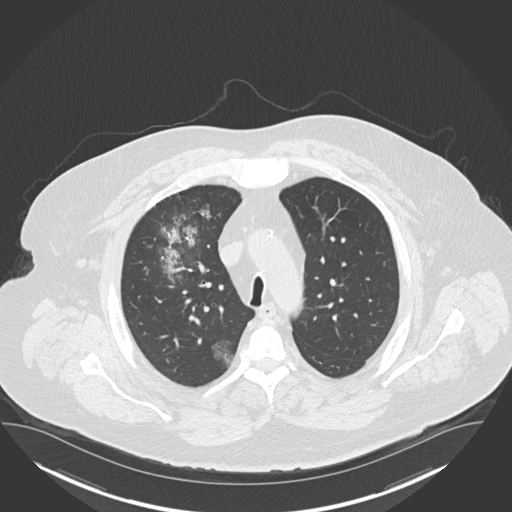
[im 134/159  lung]
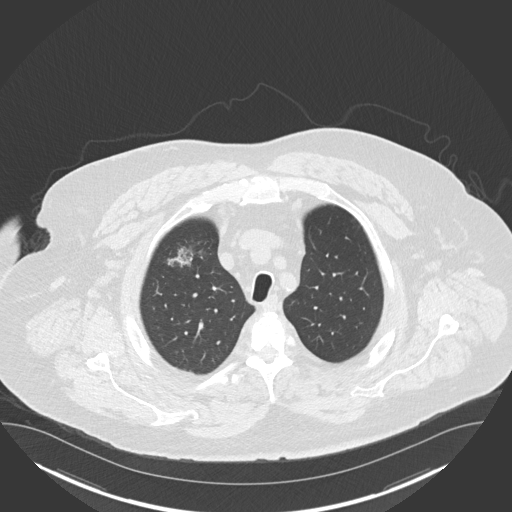
[im 146/159  lung]
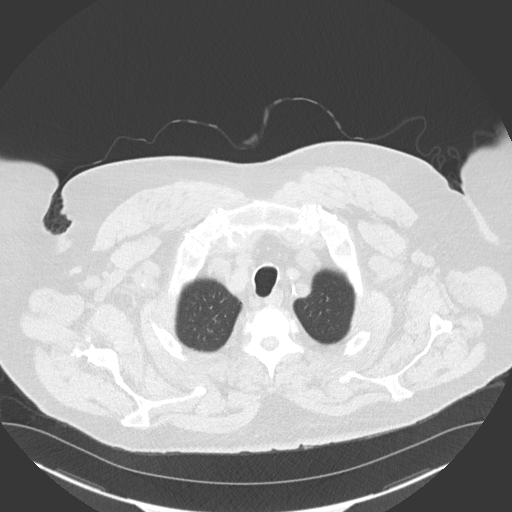

[Series 5: coronal · coronal · 0.63mm/px · 3 of 169 slices shown]
[im 34/169  lung]
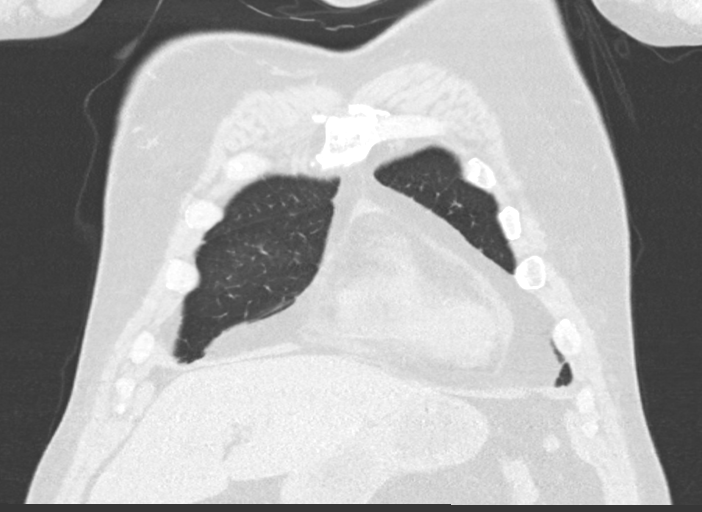
[im 68/169  lung]
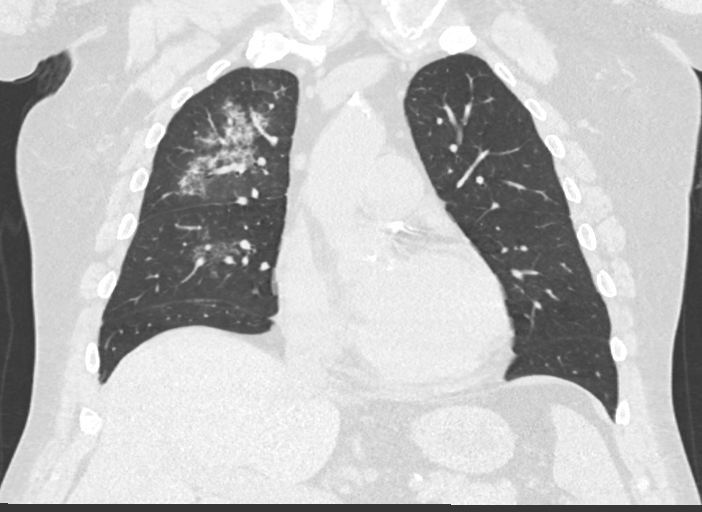
[im 101/169  lung]
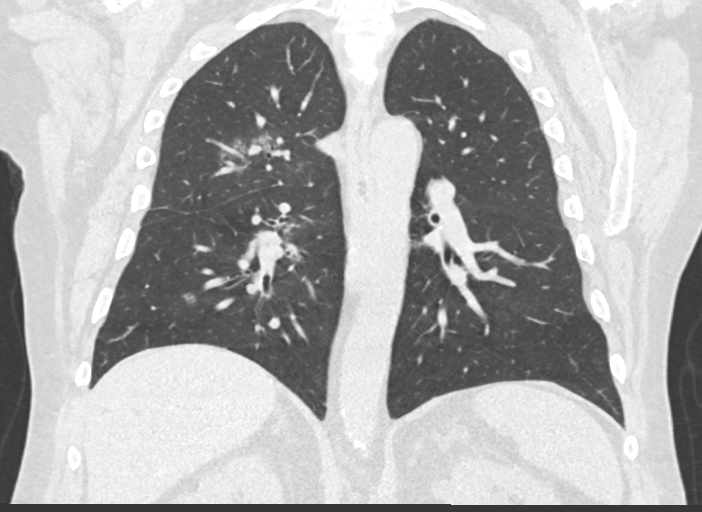

[15 of 36 positions shown; findings below may reference images not displayed]

FINDINGS: Cardiovascular: Aortic atherosclerosis. Normal heart size.
Three-vessel coronary artery calcifications. Trace pericardial
effusion.

Mediastinum/Nodes: No enlarged mediastinal, hilar, or axillary lymph
nodes. Thyroid gland, trachea, and esophagus demonstrate no
significant findings.

Lungs/Pleura: There is heterogeneous and ground-glass airspace
opacity predominantly involving the right lung, although with
scattered areas present in the left lung as well. No pleural
effusion or pneumothorax.

Upper Abdomen: No acute abnormality.

Musculoskeletal: No chest wall abnormality. No suspicious osseous
lesions identified. Chronic, callused fracture of the sternal body
(series 6, image 109).
IMPRESSION: 1. Heterogeneous and ground-glass airspace opacity predominantly
involving the right lung, although with scattered areas present in
the left lung as well. Findings are consistent with multifocal
infection.
2. Coronary artery disease.
3. Chronic, callused fracture of the sternal body.

Aortic Atherosclerosis ([id number]-5N2.2).

## 2023-11-12 DIAGNOSIS — H2511 Age-related nuclear cataract, right eye: Secondary | ICD-10-CM | POA: Diagnosis not present

## 2023-11-12 DIAGNOSIS — H2512 Age-related nuclear cataract, left eye: Secondary | ICD-10-CM | POA: Diagnosis not present

## 2023-11-12 DIAGNOSIS — H40003 Preglaucoma, unspecified, bilateral: Secondary | ICD-10-CM | POA: Diagnosis not present

## 2023-11-12 DIAGNOSIS — H25042 Posterior subcapsular polar age-related cataract, left eye: Secondary | ICD-10-CM | POA: Diagnosis not present

## 2023-11-23 ENCOUNTER — Encounter: Payer: Self-pay | Admitting: Ophthalmology

## 2023-11-24 ENCOUNTER — Encounter: Payer: Self-pay | Admitting: Ophthalmology

## 2023-11-24 NOTE — Anesthesia Preprocedure Evaluation (Addendum)
Anesthesia Evaluation  Patient identified by MRN, date of birth, ID band Patient awake    Reviewed: Allergy & Precautions, H&P , NPO status , Patient's Chart, lab work & pertinent test results  Airway Mallampati: III  TM Distance: >3 FB Neck ROM: Full    Dental no notable dental hx.    Pulmonary neg pulmonary ROS, Patient abstained from smoking.   Pulmonary exam normal breath sounds clear to auscultation       Cardiovascular hypertension, + CAD and + Past MI  negative cardio ROS Normal cardiovascular exam Rhythm:Regular Rate:Normal  Echocardiogram 2D complete: (08/16/2022) IMPRESSIONS  1. Left ventricular ejection fraction, by estimation, is 50 to 55%. The left ventricle has low normal function. The left ventricle demonstrates global hypokinesis. The left ventricular internal cavity size was moderately to severely dilated. There is  moderate concentric left ventricular hypertrophy. Left ventricular diastolic parameters are consistent with Grade II diastolic dysfunction (pseudonormalization).  2. Right ventricular systolic function is normal. The right ventricular size is normal.  3. Left atrial size was moderately dilated.  4. Right atrial size was mild to moderately dilated.  5. The mitral valve is grossly normal. Mild mitral valve regurgitation.  6. The aortic valve is calcified. Aortic valve regurgitation is trivial. Aortic valve sclerosis/calcification is present, without any evidence of aortic stenosis.  7. Aortic dilatation noted. There is moderate dilatation of the aortic root.   NM Myocardial Perfusion SPECT multiple (stress and rest): (06/03/2020) IMPRESSION: Stress test showed reduced LV function at 40%. There is moderate area of moderate hypoperfusion in the inferior wall with partial redistribution. Will need further follow-up to discuss further treatment options and studies. Abnormal stress test.  Cardiac Catheterization:  (08/07/2022) Ost RCA to Prox RCA lesion is 100% stenosed.  Mid Cx lesion is 90% stenosed.  Dist Cx lesion is 75% stenosed.  Ramus lesion is 50% stenosed.  Mid LAD lesion is 50% stenosed.  Dist RCA lesion is 100% stenosed.  There is moderate left ventricular systolic dysfunction.  LV end diastolic pressure is mildly elevated.  The left ventricular ejection fraction is 35-45% by visual estimate.   Conclusion  Successful left heart cath right radial approach  Mild to moderate depressed left ventricular function inferior hypokinesis  ejection fraction of around 40% with left ventricular enlargement      Neuro/Psych   Anxiety     negative neurological ROS  negative psych ROS   GI/Hepatic negative GI ROS, Neg liver ROS,GERD  ,,  Endo/Other  negative endocrine ROSdiabetes    Renal/GU Renal diseasenegative Renal ROS  negative genitourinary   Musculoskeletal negative musculoskeletal ROS (+)    Abdominal   Peds negative pediatric ROS (+)  Hematology negative hematology ROS (+) Blood dyscrasia, anemia   Anesthesia Other Findings   Hypertension  Cellulitis CKD (chronic kidney disease), stage V HLD (hyperlipidemia) CAD (coronary artery disease) Obesity (BMI 30-39.9) Decubitus ulcer of sacral region, stage 4 NSTEMI (non-ST elevated myocardial infarction) (HCC) Anemia  DM (diabetes mellitus), type 2 (HCC) Leg edema  Left leg numbness Peritoneal dialysis status (HCC) GERD (gastroesophageal reflux disease) Anxiety  Grade II diastolic dysfunction Mild mitral regurgitation by prior echocardiogram      Reproductive/Obstetrics negative OB ROS                              Anesthesia Physical Anesthesia Plan  ASA: 3  Anesthesia Plan: MAC   Post-op Pain Management:    Induction: Intravenous  PONV Risk Score and Plan:   Airway Management Planned: Natural Airway and Nasal Cannula  Additional Equipment:   Intra-op Plan:   Post-operative  Plan:   Informed Consent: I have reviewed the patients History and Physical, chart, labs and discussed the procedure including the risks, benefits and alternatives for the proposed anesthesia with the patient or authorized representative who has indicated his/her understanding and acceptance.     Dental Advisory Given  Plan Discussed with: Anesthesiologist, CRNA and Surgeon  Anesthesia Plan Comments: (Patient consented for risks of anesthesia including but not limited to:  - adverse reactions to medications - damage to eyes, teeth, lips or other oral mucosa - nerve damage due to positioning  - sore throat or hoarseness - Damage to heart, brain, nerves, lungs, other parts of body or loss of life  Patient voiced understanding and assent.)         Anesthesia Quick Evaluation

## 2023-11-30 NOTE — Discharge Instructions (Signed)

## 2023-12-02 ENCOUNTER — Ambulatory Visit: Payer: PPO | Admitting: Anesthesiology

## 2023-12-02 ENCOUNTER — Other Ambulatory Visit: Payer: Self-pay

## 2023-12-02 ENCOUNTER — Ambulatory Visit
Admission: RE | Admit: 2023-12-02 | Discharge: 2023-12-02 | Disposition: A | Discharge status: Home / Self Care | Payer: PPO | Attending: Ophthalmology | Admitting: Ophthalmology

## 2023-12-02 ENCOUNTER — Encounter
Admission: RE | Disposition: A | Discharge status: Home / Self Care | Payer: Self-pay | Source: Home / Self Care | Attending: Ophthalmology

## 2023-12-02 ENCOUNTER — Encounter: Payer: Self-pay | Admitting: Ophthalmology

## 2023-12-02 DIAGNOSIS — I252 Old myocardial infarction: Secondary | ICD-10-CM | POA: Diagnosis not present

## 2023-12-02 DIAGNOSIS — H2512 Age-related nuclear cataract, left eye: Secondary | ICD-10-CM | POA: Diagnosis not present

## 2023-12-02 DIAGNOSIS — I120 Hypertensive chronic kidney disease with stage 5 chronic kidney disease or end stage renal disease: Secondary | ICD-10-CM | POA: Diagnosis not present

## 2023-12-02 DIAGNOSIS — I2510 Atherosclerotic heart disease of native coronary artery without angina pectoris: Secondary | ICD-10-CM | POA: Diagnosis not present

## 2023-12-02 DIAGNOSIS — F419 Anxiety disorder, unspecified: Secondary | ICD-10-CM | POA: Insufficient documentation

## 2023-12-02 DIAGNOSIS — E1122 Type 2 diabetes mellitus with diabetic chronic kidney disease: Secondary | ICD-10-CM | POA: Diagnosis not present

## 2023-12-02 DIAGNOSIS — N185 Chronic kidney disease, stage 5: Secondary | ICD-10-CM | POA: Insufficient documentation

## 2023-12-02 DIAGNOSIS — E1136 Type 2 diabetes mellitus with diabetic cataract: Secondary | ICD-10-CM | POA: Insufficient documentation

## 2023-12-02 HISTORY — PX: CATARACT EXTRACTION W/PHACO: SHX586

## 2023-12-02 HISTORY — DX: Other ill-defined heart diseases: I51.89

## 2023-12-02 HISTORY — DX: Nonrheumatic mitral (valve) insufficiency: I34.0

## 2023-12-02 HISTORY — DX: Gastro-esophageal reflux disease without esophagitis: K21.9

## 2023-12-02 HISTORY — DX: Anxiety disorder, unspecified: F41.9

## 2023-12-02 HISTORY — DX: Dependence on renal dialysis: Z99.2

## 2023-12-02 HISTORY — DX: Anesthesia of skin: R20.0

## 2023-12-02 LAB — GLUCOSE, CAPILLARY: Glucose-Capillary: 157 mg/dL — ABNORMAL HIGH (ref 70–99)

## 2023-12-02 SURGERY — PHACOEMULSIFICATION, CATARACT, WITH IOL INSERTION
Anesthesia: Monitor Anesthesia Care | Site: Eye | Laterality: Left

## 2023-12-02 MED ORDER — ARMC OPHTHALMIC DILATING DROPS
OPHTHALMIC | Status: AC
  Filled 2023-12-02: qty 0.5

## 2023-12-02 MED ORDER — MIDAZOLAM HCL 2 MG/2ML IJ SOLN
INTRAMUSCULAR | Status: AC
  Filled 2023-12-02: qty 2

## 2023-12-02 MED ORDER — LIDOCAINE HCL (PF) 2 % IJ SOLN
INTRAOCULAR | Status: DC | PRN
  Administered 2023-12-02: 4 mL via INTRAOCULAR

## 2023-12-02 MED ORDER — BRIMONIDINE TARTRATE-TIMOLOL 0.2-0.5 % OP SOLN
OPHTHALMIC | Status: DC | PRN
  Administered 2023-12-02: 1 [drp] via OPHTHALMIC

## 2023-12-02 MED ORDER — MOXIFLOXACIN HCL 0.5 % OP SOLN
OPHTHALMIC | Status: DC | PRN
  Administered 2023-12-02: .2 mL via OPHTHALMIC

## 2023-12-02 MED ORDER — ARMC OPHTHALMIC DILATING DROPS
1.0000 | OPHTHALMIC | Status: DC | PRN
  Administered 2023-12-02 (×3): 1 via OPHTHALMIC

## 2023-12-02 MED ORDER — FENTANYL CITRATE (PF) 100 MCG/2ML IJ SOLN
INTRAMUSCULAR | Status: DC | PRN
  Administered 2023-12-02: 50 ug via INTRAVENOUS

## 2023-12-02 MED ORDER — SIGHTPATH DOSE#1 BSS IO SOLN
INTRAOCULAR | Status: DC | PRN
  Administered 2023-12-02: 15 mL via INTRAOCULAR

## 2023-12-02 MED ORDER — TETRACAINE HCL 0.5 % OP SOLN
OPHTHALMIC | Status: AC
  Filled 2023-12-02: qty 4

## 2023-12-02 MED ORDER — FENTANYL CITRATE (PF) 100 MCG/2ML IJ SOLN
INTRAMUSCULAR | Status: AC
  Filled 2023-12-02: qty 2

## 2023-12-02 MED ORDER — SIGHTPATH DOSE#1 BSS IO SOLN
INTRAOCULAR | Status: DC | PRN
  Administered 2023-12-02: 80 mL via OPHTHALMIC

## 2023-12-02 MED ORDER — TETRACAINE HCL 0.5 % OP SOLN
1.0000 [drp] | OPHTHALMIC | Status: DC | PRN
  Administered 2023-12-02 (×3): 1 [drp] via OPHTHALMIC

## 2023-12-02 MED ORDER — MIDAZOLAM HCL 2 MG/2ML IJ SOLN
INTRAMUSCULAR | Status: DC | PRN
  Administered 2023-12-02: 2 mg via INTRAVENOUS

## 2023-12-02 MED ORDER — SIGHTPATH DOSE#1 NA HYALUR & NA CHOND-NA HYALUR IO KIT
PACK | INTRAOCULAR | Status: DC | PRN
  Administered 2023-12-02: 1 via OPHTHALMIC

## 2023-12-02 SURGICAL SUPPLY — 13 items
CANNULA ANT/CHMB 27G (MISCELLANEOUS) IMPLANT
CANNULA ANT/CHMB 27GA (MISCELLANEOUS) IMPLANT
CATARACT SUITE SIGHTPATH (MISCELLANEOUS) ×1 IMPLANT
DISSECTOR HYDRO NUCLEUS 50X22 (MISCELLANEOUS) ×1 IMPLANT
DRSG TEGADERM 2-3/8X2-3/4 SM (GAUZE/BANDAGES/DRESSINGS) ×1 IMPLANT
FEE CATARACT SUITE SIGHTPATH (MISCELLANEOUS) ×1 IMPLANT
GLOVE SURG SYN 7.5 E (GLOVE) ×1 IMPLANT
GLOVE SURG SYN 7.5 PF PI (GLOVE) ×1 IMPLANT
GLOVE SURG SYN 8.5 E (GLOVE) ×1 IMPLANT
GLOVE SURG SYN 8.5 PF PI (GLOVE) ×1 IMPLANT
LENS ENVISTA ASPIRE EAU 23.0 (Intraocular Lens) ×1 IMPLANT
LENS IOL ENVISTA ASPR 23.0 (Intraocular Lens) IMPLANT
RING MALYGIN 7.0 (MISCELLANEOUS) IMPLANT

## 2023-12-02 NOTE — H&P (Signed)
[facility]   Primary Care Physician:  Barbette Reichmann, MD Ophthalmologist: Dr. Deberah Pelton  Pre-Procedure History & Physical: HPI:  Lee Foree. is a 68 y.o. male here for cataract surgery.   Past Medical History:  Diagnosis Date   Anemia    Anxiety    CAD (coronary artery disease)    had abnormal nuclear stress test 2021 or 2022   Cellulitis    CKD (chronic kidney disease), stage V (HCC)    Decubitus ulcer of sacral region, stage 4 (HCC)    resolved - required multiple surgical interventions   DM (diabetes mellitus), type 2 (HCC)    GERD (gastroesophageal reflux disease)    Grade II diastolic dysfunction    HLD (hyperlipidemia)    Hypertension    Left leg numbness    intermittent   Leg edema    Mild mitral regurgitation by prior echocardiogram    NSTEMI (non-ST elevated myocardial infarction) (HCC) 07/2022   Obesity (BMI 30-39.9)    Peritoneal dialysis status [facility])     Past Surgical History:  Procedure Laterality Date   CAPD INSERTION N/A 09/16/2022   Procedure: LAPAROSCOPIC INSERTION CONTINUOUS AMBULATORY PERITONEAL DIALYSIS  (CAPD) CATHETER, possible omentopexy;  Surgeon: Campbell Lerner, MD;  Location: [facility] ORS;  Service: General;  Laterality: N/A;   COLOSTOMY     COLOSTOMY REVERSAL     ducubitus ulcer debridement N/A 2018   LEFT HEART CATH AND CORONARY ANGIOGRAPHY N/A 08/07/2022   Procedure: LEFT HEART CATH AND CORONARY ANGIOGRAPHY;  Surgeon: Alwyn Pea, MD;  Location: [facility] INVASIVE CV LAB;  Service: Cardiovascular;  Laterality: N/A;   TEMPORARY DIALYSIS CATHETER Right 08/07/2022   Procedure: TEMPORARY DIALYSIS CATHETER;  Surgeon: Annice Needy, MD;  Location: [facility] INVASIVE CV LAB;  Service: Cardiovascular;  Laterality: Right;    Prior to Admission medications   Medication Sig Start Date End Date Taking? Authorizing Provider  Alpha-Lipoic Acid 50 MG TABS Take by mouth daily.   Yes [provider]  amLODipine (NORVASC) 5 MG  tablet Take 5 mg by mouth every morning. 07/16/22  Yes [provider]  aspirin EC 81 MG tablet Take 81 mg by mouth daily.   Yes [provider]  carvedilol (COREG) 3.125 MG tablet Take 1 tablet (3.125 mg total) by mouth 2 (two) times daily with a meal. 08/10/22  Yes Arnetha Courser, MD  cholecalciferol (VITAMIN D3) 10 MCG (400 UNIT) TABS tablet Take 400 Units by mouth daily.   Yes [provider]  cloNIDine (CATAPRES) 0.3 MG tablet Take 0.3 mg by mouth 2 (two) times daily. 07/16/22  Yes [provider]  clopidogrel (PLAVIX) 75 MG tablet Take 1 tablet (75 mg total) by mouth daily. 08/11/22  Yes Arnetha Courser, MD  DOCOSAHEXAENOIC ACID PO Take by mouth daily.   Yes [provider]  doxazosin (CARDURA) 4 MG tablet Take 1 tablet (4 mg total) by mouth at bedtime. 08/10/22  Yes Arnetha Courser, MD  DULoxetine (CYMBALTA) 30 MG capsule Take 30 mg by mouth daily after lunch. 07/19/22  Yes [provider]  fenofibrate (TRICOR) 48 MG tablet Take 48 mg by mouth every morning. 09/07/16  Yes [provider]  gabapentin (NEURONTIN) 600 MG tablet Take 600 mg by mouth 3 (three) times daily.   Yes [provider]  glimepiride (AMARYL) 1 MG tablet Take 1 mg by mouth daily with breakfast.   Yes [provider]  hydrALAZINE (APRESOLINE) 25 MG tablet Take 100 mg by mouth  3 (three) times daily. 02/14/16 11/23/23 Yes [provider]  isosorbide mononitrate (IMDUR) 30 MG 24 hr tablet Take 30 mg by mouth every morning. 07/16/22  Yes [provider]  losartan (COZAAR) 100 MG tablet Take 100 mg by mouth every morning.   Yes [provider]  Multiple Vitamin (MULTIVITAMIN) tablet Take 1 tablet by mouth daily.   Yes [provider]  oxyCODONE-acetaminophen (PERCOCET/ROXICET) 5-325 MG tablet Take 2 tablets by mouth 2 (two) times daily. 08/03/22  Yes [provider]  rOPINIRole (REQUIP) 0.5 MG tablet Take 1 tablet  (0.5 mg total) by mouth at bedtime. 08/10/22  Yes Arnetha Courser, MD  rosuvastatin (CRESTOR) 10 MG tablet Take 10 mg by mouth at bedtime. 07/16/22  Yes [provider]  valsartan-hydrochlorothiazide (DIOVAN-HCT) 320-12.5 MG tablet Take 1 tablet by mouth at bedtime. 07/02/16  Yes [provider]  collagenase (SANTYL) ointment Apply 1 Application topically as needed. 02/12/17   [provider]  cyanocobalamin (VITAMIN B12) 1000 MCG tablet Take 1,000 mcg by mouth daily.    [provider]  cyclobenzaprine (FLEXERIL) 10 MG tablet Take 10 mg by mouth 3 (three) times daily as needed. Patient not taking: Reported on 11/23/2023 02/12/17   [provider]  ferrous gluconate (FERGON) 324 MG tablet Take 1 tablet (324 mg total) by mouth daily with breakfast. Patient not taking: Reported on 11/23/2023 08/11/22   Arnetha Courser, MD  sodium zirconium cyclosilicate (LOKELMA) 10 g PACK packet Take 10 g by mouth daily. To start 5 days prior to surgery(PD cath placement) per pts nephrologist Patient not taking: Reported on 11/23/2023    [provider]    Allergies as of 11/16/2023 - Review Complete 09/16/2022  Allergen Reaction Noted   Cephalexin Rash 10/02/2016    Family History  Problem Relation Age of Onset   Cancer Mother    Hypertension Mother    Heart attack Father    Cancer Father    Stroke Sister    Diabetes Brother    Kidney failure Brother     Social History   Socioeconomic History   Marital status: Married    Spouse name: Not on file   Number of children: Not on file   Years of education: Not on file   Highest education level: Not on file  Occupational History   Not on file  Tobacco Use   Smoking status: Never   Smokeless tobacco: Never   Tobacco comments:    when he was 68yrs old.  Vaping Use   Vaping status: Never Used  Substance and Sexual Activity   Alcohol use: Not Currently   Drug use: Yes    Types: Marijuana    Comment:  "about 1x/week"   Sexual activity: Yes    Partners: Female    Birth control/protection: None  Other Topics Concern   Not on file  Social History Narrative   Not on file   Social Drivers of Health   Financial Resource Strain: Low Risk  (10/05/2023)   Received from [facility]   Overall Financial Resource Strain (CARDIA)    Difficulty of Paying Living Expenses: Not hard at all  Food Insecurity: No Food Insecurity (10/05/2023)   Received from [facility]   Hunger Vital Sign    Ran Out of Food in the Last Year: Never true    Worried About Running Out of Food in the Last Year: Never true  Transportation Needs: No Transportation Needs (10/05/2023)  Received from [facility]   PRAPARE - Transportation    Lack of Transportation (Non-Medical): No    In the past 12 months, has lack of transportation kept you from medical appointments or from getting medications?: No  Physical Activity: Not on file  Stress: Not on file  Social Connections: Not on file  Intimate Partner Violence: Not At Risk (08/06/2022)   Humiliation, Afraid, Rape, and Kick questionnaire    Fear of Current or Ex-Partner: No    Emotionally Abused: No    Physically Abused: No    Sexually Abused: No    Review of Systems: See HPI, otherwise negative ROS  Physical Exam: Ht 6\' 1"  (1.854 m)   Wt 113.4 kg   BMI 32.98 kg/m  General:   Alert, cooperative in NAD Head:  Normocephalic and atraumatic. Respiratory:  Normal work of breathing. Cardiovascular:  RRR  Impression/Plan: Lee Saba. is here for cataract surgery.  Risks, benefits, limitations, and alternatives regarding cataract surgery have been reviewed with the patient.  Questions have been answered.  All parties agreeable.   Estanislado Pandy, MD  12/02/2023, 7:13 AM

## 2023-12-02 NOTE — Anesthesia Postprocedure Evaluation (Signed)
Anesthesia Post Note  Patient: Lee Bryant.  Procedure(s) Performed: CATARACT EXTRACTION PHACO AND INTRAOCULAR LENS PLACEMENT (IOC) LEFT DIABETIC OMIDRIA 6.90 00:35.8 (Left: Eye)  Patient location during evaluation: PACU Anesthesia Type: MAC Level of consciousness: awake and alert Pain management: pain level controlled Vital Signs Assessment: post-procedure vital signs reviewed and stable Respiratory status: spontaneous breathing, nonlabored ventilation, respiratory function stable and patient connected to nasal cannula oxygen Cardiovascular status: stable and blood pressure returned to baseline Postop Assessment: no apparent nausea or vomiting Anesthetic complications: no   No notable events documented.   Last Vitals:  Vitals:   12/02/23 1115 12/02/23 1119  BP: (!) 151/79 (!) 157/75  Pulse: 70 71  Resp: 10 15  Temp: 36.8 C 36.8 C  SpO2: 96% 97%    Last Pain:  Vitals:   12/02/23 1119  TempSrc:   PainSc: 0-No pain                 Charels Stambaugh C Chealsea Paske

## 2023-12-02 NOTE — Transfer of Care (Signed)
Immediate Anesthesia Transfer of Care Note  Patient: Lee Bryant.  Procedure(s) Performed: CATARACT EXTRACTION PHACO AND INTRAOCULAR LENS PLACEMENT (IOC) LEFT DIABETIC OMIDRIA 6.90 00:35.8 (Left: Eye)  Patient Location: PACU  Anesthesia Type: MAC  Level of Consciousness: awake, alert  and patient cooperative  Airway and Oxygen Therapy: Patient Spontanous Breathing and Patient connected to supplemental oxygen  Post-op Assessment: Post-op Vital signs reviewed, Patient's Cardiovascular Status Stable, Respiratory Function Stable, Patent Airway and No signs of Nausea or vomiting  Post-op Vital Signs: Reviewed and stable  Complications: No notable events documented.

## 2023-12-02 NOTE — Op Note (Signed)
OPERATIVE NOTE  Lee Bryant [id number] 12/02/2023   PREOPERATIVE DIAGNOSIS: Nuclear sclerotic cataract left eye. H25.12   POSTOPERATIVE DIAGNOSIS: Nuclear sclerotic cataract left eye. H25.12   PROCEDURE:  Phacoemusification with posterior chamber intraocular lens placement of the left eye  Ultrasound time: Procedure(s): CATARACT EXTRACTION PHACO AND INTRAOCULAR LENS PLACEMENT (IOC) LEFT DIABETIC OMIDRIA 6.90 00:35.8 (Left)  LENS:   Implant Name Type Inv. Item Serial No. Manufacturer Lot No. LRB No. Used Action  LENS ENVISTA ASPIRE EAU 23.0 - [id number] Intraocular Lens LENS ENVISTA ASPIRE EAU 23.0 [id number] SIGHTPATH [id number] Left 1 Implanted      SURGEON:  Julious Payer. Rolley Sims, MD   ANESTHESIA:  Topical with tetracaine drops, augmented with 1% preservative-free intracameral lidocaine.   COMPLICATIONS:  None.   DESCRIPTION OF PROCEDURE:  The patient was identified in the holding room and transported to the operating room and placed in the supine position under the operating microscope.  The left eye was identified as the operative eye, which was prepped and draped in the usual sterile ophthalmic fashion.   A 1 millimeter clear-corneal paracentesis was made inferotemporally. Preservative-free 1% lidocaine mixed with 1:1,000 bisulfite-free aqueous solution of epinephrine was injected into the anterior chamber. The anterior chamber was then filled with Viscoat viscoelastic. A 2.4 millimeter keratome was used to make a clear-corneal incision superotemporally. A curvilinear capsulorrhexis was made with a cystotome and capsulorrhexis forceps. Balanced salt solution was used to hydrodissect and hydrodelineate the nucleus. Phacoemulsification was then used to remove the lens nucleus and epinucleus. The remaining cortex was then removed using the irrigation and aspiration handpiece. Provisc was then placed into the capsular bag to distend it for lens placement. An +23.00 D EA intraocular lens  was then injected into the capsular bag. The remaining viscoelastic was aspirated.   Wounds were hydrated with balanced salt solution.  The anterior chamber was inflated to a physiologic pressure with balanced salt solution.  No wound leaks were noted. Moxifloxacin was injected intracamerally.  Timolol and Brimonidine drops were applied to the eye.  The patient was taken to the recovery room in stable condition without complications of anesthesia or surgery.  [employer] 12/02/2023, 11:13 AM

## 2023-12-03 ENCOUNTER — Encounter: Payer: Self-pay | Admitting: Ophthalmology

## 2023-12-06 NOTE — Anesthesia Preprocedure Evaluation (Addendum)
 Anesthesia Evaluation  Patient identified by MRN, date of birth, ID band Patient awake    Reviewed: Allergy & Precautions, NPO status , Patient's Chart, lab work & pertinent test results  History of Anesthesia Complications Negative for: history of anesthetic complications  Airway Mallampati: III  TM Distance: >3 FB Neck ROM: Full    Dental  (+) Missing   Pulmonary neg pulmonary ROS   Pulmonary exam normal breath sounds clear to auscultation       Cardiovascular hypertension, + CAD and + Past MI  Normal cardiovascular exam Rhythm:Regular Rate:Normal  chocardiogram 2D complete: (08/16/2022) IMPRESSIONS  1. Left ventricular ejection fraction, by estimation, is 50 to 55%. The left ventricle has low normal function. The left ventricle demonstrates global hypokinesis. The left ventricular internal cavity size was moderately to severely dilated. There is  moderate concentric left ventricular hypertrophy. Left ventricular diastolic parameters are consistent with Grade II diastolic dysfunction (pseudonormalization).  2. Right ventricular systolic function is normal. The right ventricular size is normal.  3. Left atrial size was moderately dilated.  4. Right atrial size was mild to moderately dilated.  5. The mitral valve is grossly normal. Mild mitral valve regurgitation.  6. The aortic valve is calcified. Aortic valve regurgitation is trivial. Aortic valve sclerosis/calcification is present, without any evidence of aortic stenosis.  7. Aortic dilatation noted. There is moderate dilatation of the aortic root.    NM Myocardial Perfusion SPECT multiple (stress and rest): (06/03/2020) IMPRESSION: Stress test showed reduced LV function at 40%. There is moderate area of moderate hypoperfusion in the inferior wall with partial redistribution. Will need further follow-up to discuss further treatment options and studies. Abnormal stress test.    Cardiac Catheterization: (08/07/2022) Ost RCA to Prox RCA lesion is 100% stenosed.  Mid Cx lesion is 90% stenosed.  Dist Cx lesion is 75% stenosed.  Ramus lesion is 50% stenosed.  Mid LAD lesion is 50% stenosed.  Dist RCA lesion is 100% stenosed.  There is moderate left ventricular systolic dysfunction.  LV end diastolic pressure is mildly elevated.  The left ventricular ejection fraction is 35-45% by visual estimate.    Conclusion  Successful left heart cath right radial approach  Mild to moderate depressed left ventricular function inferior hypokinesis  ejection fraction of around 40% with left ventricular enlargement           Neuro/Psych  PSYCHIATRIC DISORDERS Anxiety     Occasional marijuana use    GI/Hepatic negative GI ROS,,,  Endo/Other  diabetes, Type 2  Obesity   Renal/GU Renal disease (stage V CKD)     Musculoskeletal   Abdominal   Peds  Hematology  (+) Blood dyscrasia, anemia   Anesthesia Other Findings Previous cataract surgery 12-02-23 had versed 2 mg IV and fentanyl 50 mcg IV  Reproductive/Obstetrics                             Anesthesia Physical Anesthesia Plan  ASA: 3  Anesthesia Plan: MAC   Post-op Pain Management:    Induction: Intravenous  PONV Risk Score and Plan: 1 and Treatment may vary due to age or medical condition, Midazolam and TIVA  Airway Management Planned: Natural Airway and Nasal Cannula  Additional Equipment:   Intra-op Plan:   Post-operative Plan:   Informed Consent: I have reviewed the patients History and Physical, chart, labs and discussed the procedure including the risks, benefits and alternatives for the proposed anesthesia with the  patient or authorized representative who has indicated his/her understanding and acceptance.     Dental advisory given  Plan Discussed with: CRNA  Anesthesia Plan Comments: (LMA/GETA backup discussed.  Patient consented for risks of anesthesia  including but not limited to:  - adverse reactions to medications - damage to eyes, teeth, lips or other oral mucosa - nerve damage due to positioning  - sore throat or hoarseness - damage to heart, brain, nerves, lungs, other parts of body or loss of life  Informed patient about role of CRNA in peri- and intra-operative care.  Patient voiced understanding.)        Anesthesia Quick Evaluation

## 2023-12-14 NOTE — Discharge Instructions (Signed)

## 2024-01-06 ENCOUNTER — Ambulatory Visit: Payer: Self-pay | Admitting: Anesthesiology

## 2024-01-06 ENCOUNTER — Ambulatory Visit
Admission: RE | Admit: 2024-01-06 | Discharge: 2024-01-06 | Disposition: A | Discharge status: Home / Self Care | Payer: PPO | Attending: Ophthalmology | Admitting: Ophthalmology

## 2024-01-06 ENCOUNTER — Encounter: Payer: Self-pay | Admitting: Ophthalmology

## 2024-01-06 ENCOUNTER — Encounter
Admission: RE | Disposition: A | Discharge status: Home / Self Care | Payer: Self-pay | Source: Home / Self Care | Attending: Ophthalmology

## 2024-01-06 ENCOUNTER — Other Ambulatory Visit: Payer: Self-pay

## 2024-01-06 DIAGNOSIS — I1311 Hypertensive heart and chronic kidney disease without heart failure, with stage 5 chronic kidney disease, or end stage renal disease: Secondary | ICD-10-CM | POA: Insufficient documentation

## 2024-01-06 DIAGNOSIS — E1122 Type 2 diabetes mellitus with diabetic chronic kidney disease: Secondary | ICD-10-CM | POA: Diagnosis not present

## 2024-01-06 DIAGNOSIS — I080 Rheumatic disorders of both mitral and aortic valves: Secondary | ICD-10-CM | POA: Diagnosis not present

## 2024-01-06 DIAGNOSIS — D631 Anemia in chronic kidney disease: Secondary | ICD-10-CM | POA: Insufficient documentation

## 2024-01-06 DIAGNOSIS — N185 Chronic kidney disease, stage 5: Secondary | ICD-10-CM | POA: Diagnosis not present

## 2024-01-06 DIAGNOSIS — E669 Obesity, unspecified: Secondary | ICD-10-CM | POA: Diagnosis not present

## 2024-01-06 DIAGNOSIS — I2510 Atherosclerotic heart disease of native coronary artery without angina pectoris: Secondary | ICD-10-CM | POA: Diagnosis not present

## 2024-01-06 DIAGNOSIS — Z7984 Long term (current) use of oral hypoglycemic drugs: Secondary | ICD-10-CM | POA: Diagnosis not present

## 2024-01-06 DIAGNOSIS — I252 Old myocardial infarction: Secondary | ICD-10-CM | POA: Diagnosis not present

## 2024-01-06 DIAGNOSIS — F419 Anxiety disorder, unspecified: Secondary | ICD-10-CM | POA: Diagnosis not present

## 2024-01-06 DIAGNOSIS — H2511 Age-related nuclear cataract, right eye: Secondary | ICD-10-CM | POA: Diagnosis not present

## 2024-01-06 DIAGNOSIS — E1136 Type 2 diabetes mellitus with diabetic cataract: Secondary | ICD-10-CM | POA: Insufficient documentation

## 2024-01-06 DIAGNOSIS — I340 Nonrheumatic mitral (valve) insufficiency: Secondary | ICD-10-CM | POA: Insufficient documentation

## 2024-01-06 DIAGNOSIS — Z6834 Body mass index (BMI) 34.0-34.9, adult: Secondary | ICD-10-CM | POA: Insufficient documentation

## 2024-01-06 DIAGNOSIS — Z992 Dependence on renal dialysis: Secondary | ICD-10-CM | POA: Diagnosis not present

## 2024-01-06 HISTORY — PX: CATARACT EXTRACTION W/PHACO: SHX586

## 2024-01-06 LAB — GLUCOSE, CAPILLARY: Glucose-Capillary: 104 mg/dL — ABNORMAL HIGH (ref 70–99)

## 2024-01-06 SURGERY — PHACOEMULSIFICATION, CATARACT, WITH IOL INSERTION
Anesthesia: Monitor Anesthesia Care | Laterality: Right

## 2024-01-06 MED ORDER — FENTANYL CITRATE (PF) 100 MCG/2ML IJ SOLN
INTRAMUSCULAR | Status: AC
  Filled 2024-01-06: qty 2

## 2024-01-06 MED ORDER — TETRACAINE HCL 0.5 % OP SOLN
OPHTHALMIC | Status: AC
  Filled 2024-01-06: qty 4

## 2024-01-06 MED ORDER — MIDAZOLAM HCL 2 MG/2ML IJ SOLN
INTRAMUSCULAR | Status: AC
  Filled 2024-01-06: qty 2

## 2024-01-06 MED ORDER — TETRACAINE HCL 0.5 % OP SOLN
1.0000 [drp] | OPHTHALMIC | Status: DC | PRN
  Administered 2024-01-06 (×3): 1 [drp] via OPHTHALMIC

## 2024-01-06 MED ORDER — LIDOCAINE HCL (PF) 2 % IJ SOLN
INTRAOCULAR | Status: DC | PRN
  Administered 2024-01-06: 1 mL via INTRAOCULAR

## 2024-01-06 MED ORDER — SIGHTPATH DOSE#1 NA HYALUR & NA CHOND-NA HYALUR IO KIT
PACK | INTRAOCULAR | Status: DC | PRN
  Administered 2024-01-06: 1 via OPHTHALMIC

## 2024-01-06 MED ORDER — FENTANYL CITRATE (PF) 100 MCG/2ML IJ SOLN
INTRAMUSCULAR | Status: DC | PRN
Start: 2024-01-06 — End: 2024-01-06
  Administered 2024-01-06: 100 ug via INTRAVENOUS

## 2024-01-06 MED ORDER — MIDAZOLAM HCL 2 MG/2ML IJ SOLN
INTRAMUSCULAR | Status: DC | PRN
  Administered 2024-01-06: 2 mg via INTRAVENOUS

## 2024-01-06 MED ORDER — ARMC OPHTHALMIC DILATING DROPS
OPHTHALMIC | Status: AC
  Filled 2024-01-06: qty 0.5

## 2024-01-06 MED ORDER — ARMC OPHTHALMIC DILATING DROPS
1.0000 | OPHTHALMIC | Status: DC | PRN
  Administered 2024-01-06 (×3): 1 via OPHTHALMIC

## 2024-01-06 MED ORDER — MOXIFLOXACIN HCL 0.5 % OP SOLN
OPHTHALMIC | Status: DC | PRN
  Administered 2024-01-06: .2 mL via OPHTHALMIC

## 2024-01-06 MED ORDER — SIGHTPATH DOSE#1 BSS IO SOLN
INTRAOCULAR | Status: DC | PRN
  Administered 2024-01-06: 15 mL via INTRAOCULAR

## 2024-01-06 MED ORDER — BRIMONIDINE TARTRATE-TIMOLOL 0.2-0.5 % OP SOLN
OPHTHALMIC | Status: DC | PRN
  Administered 2024-01-06: 1 [drp] via OPHTHALMIC

## 2024-01-06 MED ORDER — PHENYLEPHRINE-KETOROLAC 1-0.3 % IO SOLN
INTRAOCULAR | Status: DC | PRN
  Administered 2024-01-06: 64 mL via OPHTHALMIC

## 2024-01-06 SURGICAL SUPPLY — 13 items
CATARACT SUITE SIGHTPATH (MISCELLANEOUS) ×1 IMPLANT
DISSECTOR HYDRO NUCLEUS 50X22 (MISCELLANEOUS) ×1 IMPLANT
DRSG TEGADERM 2-3/8X2-3/4 SM (GAUZE/BANDAGES/DRESSINGS) ×1 IMPLANT
FEE CATARACT SUITE SIGHTPATH (MISCELLANEOUS) ×1 IMPLANT
GLOVE BIOGEL PI IND STRL 8 (GLOVE) ×1 IMPLANT
GLOVE SURG LX STRL 7.5 STRW (GLOVE) ×1 IMPLANT
GLOVE SURG PROTEXIS BL SZ6.5 (GLOVE) ×1 IMPLANT
GLOVE SURG SYN 6.5 PF PI BL (GLOVE) ×1 IMPLANT
LENS ENVISTA 23.5 ×1 IMPLANT
LENS IOL ENVISTA UV+ 23.5 IMPLANT
NDL FILTER BLUNT 18X1 1/2 (NEEDLE) ×1 IMPLANT
NEEDLE FILTER BLUNT 18X1 1/2 (NEEDLE) ×1 IMPLANT
SYR 3ML LL SCALE MARK (SYRINGE) ×1 IMPLANT

## 2024-01-06 NOTE — Op Note (Signed)
 OPERATIVE NOTE  Lee Bryant [id number] 01/06/2024   PREOPERATIVE DIAGNOSIS: Nuclear sclerotic cataract right eye. H25.11   POSTOPERATIVE DIAGNOSIS: Nuclear sclerotic cataract right eye. H25.11   PROCEDURE:  Phacoemusification with posterior chamber intraocular lens placement of the right eye  Ultrasound time: Procedure(s): CATARACT EXTRACTION PHACO AND INTRAOCULAR LENS PLACEMENT (IOC) RIGHT MALYUGIN OMDIRIA DIABETIC 5.75, 00:42.4 (Right)  LENS:   Implant Name Type Inv. Item Serial No. Manufacturer Lot No. LRB No. Used Action  LENS ENVISTA 23.5 - [id number]  LENS ENVISTA 23.5 [id number] [employer]IGHTPATH  Right 1 Implanted      SURGEON:  Julious Payer. Rolley Sims, MD   ANESTHESIA:  Topical with tetracaine drops, augmented with 1% preservative-free intracameral lidocaine.   COMPLICATIONS:  None.   DESCRIPTION OF PROCEDURE:  The patient was identified in the holding room and transported to the operating room and placed in the supine position under the operating microscope.  The right eye was identified as the operative eye, which was prepped and draped in the usual sterile ophthalmic fashion.   A 1 millimeter clear-corneal paracentesis was made superotemporally. Preservative-free 1% lidocaine mixed with 1:1,000 bisulfite-free aqueous solution of epinephrine was injected into the anterior chamber. The anterior chamber was then filled with Viscoat viscoelastic. A 2.4 millimeter keratome was used to make a clear-corneal incision inferotemporally. A curvilinear capsulorrhexis was made with a cystotome and capsulorrhexis forceps. Balanced salt solution was used to hydrodissect and hydrodelineate the nucleus. Phacoemulsification was then used to remove the lens nucleus and epinucleus. The remaining cortex was then removed using the irrigation and aspiration handpiece. Provisc was then placed into the capsular bag to distend it for lens placement. A +23.50 EE intraocular lens was then injected into the  capsular bag. The remaining viscoelastic was aspirated.   Wounds were hydrated with balanced salt solution.  The anterior chamber was inflated to a physiologic pressure with balanced salt solution.  No wound leaks were noted. Moxifloxacin was injected intracamerally.  Timolol and Brimonidine drops were applied to the eye.  The patient was taken to the recovery room in stable condition without complications of anesthesia or surgery.  [employer] 01/06/2024, 7:55 AM

## 2024-01-06 NOTE — H&P (Signed)
 [facility]   Primary Care Physician:  Barbette Reichmann, MD Ophthalmologist: Dr. Deberah Pelton  Pre-Procedure History & Physical: HPI:  Lee Bryant. is a 68 y.o. male here for cataract surgery.   Past Medical History:  Diagnosis Date   Anemia    Anxiety    CAD (coronary artery disease)    had abnormal nuclear stress test 2021 or 2022   Cellulitis    CKD (chronic kidney disease), stage V (HCC)    Decubitus ulcer of sacral region, stage 4 (HCC)    resolved - required multiple surgical interventions   DM (diabetes mellitus), type 2 (HCC)    GERD (gastroesophageal reflux disease)    Grade II diastolic dysfunction    HLD (hyperlipidemia)    Hypertension    Left leg numbness    intermittent   Leg edema    Mild mitral regurgitation by prior echocardiogram    NSTEMI (non-ST elevated myocardial infarction) (HCC) 07/2022   Obesity (BMI 30-39.9)    Peritoneal dialysis status [facility])     Past Surgical History:  Procedure Laterality Date   CAPD INSERTION N/A 09/16/2022   Procedure: LAPAROSCOPIC INSERTION CONTINUOUS AMBULATORY PERITONEAL DIALYSIS  (CAPD) CATHETER, possible omentopexy;  Surgeon: Campbell Lerner, MD;  Location: [facility] ORS;  Service: General;  Laterality: N/A;   CATARACT EXTRACTION W/PHACO Left 12/02/2023   Procedure: CATARACT EXTRACTION PHACO AND INTRAOCULAR LENS PLACEMENT (IOC) LEFT DIABETIC OMIDRIA 6.90 00:35.8;  Surgeon: Estanislado Pandy, MD;  Location: [facility];  Service: Ophthalmology;  Laterality: Left;   COLOSTOMY     COLOSTOMY REVERSAL     ducubitus ulcer debridement N/A 2018   LEFT HEART CATH AND CORONARY ANGIOGRAPHY N/A 08/07/2022   Procedure: LEFT HEART CATH AND CORONARY ANGIOGRAPHY;  Surgeon: Alwyn Pea, MD;  Location: [facility] INVASIVE CV LAB;  Service: Cardiovascular;  Laterality: N/A;   TEMPORARY DIALYSIS CATHETER Right 08/07/2022   Procedure: TEMPORARY DIALYSIS CATHETER;  Surgeon: Annice Needy, MD;  Location: [facility]  INVASIVE CV LAB;  Service: Cardiovascular;  Laterality: Right;    Prior to Admission medications   Medication Sig Start Date End Date Taking? Authorizing Provider  Alpha-Lipoic Acid 50 MG TABS Take by mouth daily.   Yes [provider]  amLODipine (NORVASC) 5 MG tablet Take 5 mg by mouth every morning. 07/16/22  Yes [provider]  aspirin EC 81 MG tablet Take 81 mg by mouth daily.   Yes [provider]  carvedilol (COREG) 3.125 MG tablet Take 1 tablet (3.125 mg total) by mouth 2 (two) times daily with a meal. 08/10/22  Yes Arnetha Courser, MD  cholecalciferol (VITAMIN D3) 10 MCG (400 UNIT) TABS tablet Take 400 Units by mouth daily.   Yes [provider]  cloNIDine (CATAPRES) 0.3 MG tablet Take 0.3 mg by mouth 2 (two) times daily. 07/16/22  Yes [provider]  clopidogrel (PLAVIX) 75 MG tablet Take 1 tablet (75 mg total) by mouth daily. 08/11/22  Yes Arnetha Courser, MD  collagenase (SANTYL) ointment Apply 1 Application topically as needed. 02/12/17  Yes [provider]  cyanocobalamin (VITAMIN B12) 1000 MCG tablet Take 1,000 mcg by mouth daily.   Yes [provider]  DOCOSAHEXAENOIC ACID PO Take by mouth daily.   Yes [provider]  doxazosin (CARDURA) 4 MG tablet Take 1 tablet (4 mg total) by mouth at bedtime. 08/10/22  Yes Arnetha Courser, MD  DULoxetine (CYMBALTA) 30 MG capsule Take 30 mg by mouth daily after lunch. 07/19/22  Yes [provider]  fenofibrate (TRICOR) 48 MG tablet Take 48 mg by mouth every morning. 09/07/16  Yes [provider]  gabapentin (NEURONTIN) 600 MG tablet Take 600 mg by mouth 3 (three) times daily.   Yes [provider]  glimepiride (AMARYL) 1 MG tablet Take 1 mg by mouth daily with breakfast.   Yes [provider]  isosorbide mononitrate (IMDUR) 30 MG 24 hr tablet Take 30 mg by mouth every morning. 07/16/22  Yes [provider]  losartan (COZAAR) 100 MG  tablet Take 100 mg by mouth every morning.   Yes [provider]  Multiple Vitamin (MULTIVITAMIN) tablet Take 1 tablet by mouth daily.   Yes [provider]  oxyCODONE-acetaminophen (PERCOCET/ROXICET) 5-325 MG tablet Take 2 tablets by mouth 2 (two) times daily. 08/03/22  Yes [provider]  rOPINIRole (REQUIP) 0.5 MG tablet Take 1 tablet (0.5 mg total) by mouth at bedtime. 08/10/22  Yes Arnetha Courser, MD  rosuvastatin (CRESTOR) 10 MG tablet Take 10 mg by mouth at bedtime. 07/16/22  Yes [provider]  valsartan-hydrochlorothiazide (DIOVAN-HCT) 320-12.5 MG tablet Take 1 tablet by mouth at bedtime. 07/02/16  Yes [provider]  cyclobenzaprine (FLEXERIL) 10 MG tablet Take 10 mg by mouth 3 (three) times daily as needed. Patient not taking: Reported on 11/23/2023 02/12/17   [provider]  ferrous gluconate (FERGON) 324 MG tablet Take 1 tablet (324 mg total) by mouth daily with breakfast. Patient not taking: Reported on 01/06/2024 08/11/22   Arnetha Courser, MD  hydrALAZINE (APRESOLINE) 25 MG tablet Take 100 mg by mouth 3 (three) times daily. 02/14/16 11/23/23  [provider]  sodium zirconium cyclosilicate (LOKELMA) 10 g PACK packet Take 10 g by mouth daily. To start 5 days prior to surgery(PD cath placement) per pts nephrologist Patient not taking: Reported on 11/23/2023    [provider]    Allergies as of 11/16/2023 - Review Complete 09/16/2022  Allergen Reaction Noted   Cephalexin Rash 10/02/2016    Family History  Problem Relation Age of Onset   Cancer Mother    Hypertension Mother    Heart attack Father    Cancer Father    Stroke Sister    Diabetes Brother    Kidney failure Brother     Social History   Socioeconomic History   Marital status: Married    Spouse name: Not on file   Number of children: Not on file   Years of education: Not on file   Highest education level: Not on file  Occupational History   Not  on file  Tobacco Use   Smoking status: Never   Smokeless tobacco: Never   Tobacco comments:    when he was 68yrs old.  Vaping Use   Vaping status: Never Used  Substance and Sexual Activity   Alcohol use: Not Currently   Drug use: Yes    Types: Marijuana    Comment: "about 1x/week"   Sexual activity: Yes    Partners: Female    Birth control/protection: None  Other Topics Concern   Not on file  Social History Narrative   Not on file   Social Drivers of Health   Financial Resource Strain: Low Risk  (10/05/2023)   Received from [facility]   Overall Financial Resource Strain (CARDIA)    Difficulty of Paying Living Expenses: Not hard at all  Food Insecurity: No Food Insecurity (10/05/2023)   Received from [facility]  Hunger Vital Sign    Ran Out of Food in the Last Year: Never true    Worried About Running Out of Food in the Last Year: Never true  Transportation Needs: No Transportation Needs (10/05/2023)   Received from [facility]   PRAPARE - Transportation    Lack of Transportation (Non-Medical): No    In the past 12 months, has lack of transportation kept you from medical appointments or from getting medications?: No  Physical Activity: Not on file  Stress: Not on file  Social Connections: Not on file  Intimate Partner Violence: Not At Risk (08/06/2022)   Humiliation, Afraid, Rape, and Kick questionnaire    Fear of Current or Ex-Partner: No    Emotionally Abused: No    Physically Abused: No    Sexually Abused: No    Review of Systems: See HPI, otherwise negative ROS  Physical Exam: BP (!) 173/89   Temp 98.8 F (37.1 C) (Temporal)   Resp 13   Ht 6' 0.99" (1.854 m)   Wt 118.2 kg   SpO2 94%   BMI 34.39 kg/m  General:   Alert, cooperative in NAD Head:  Normocephalic and atraumatic. Respiratory:  Normal work of breathing. Cardiovascular:  RRR  Impression/Plan: Ellin Saba. is here for  cataract surgery.  Risks, benefits, limitations, and alternatives regarding cataract surgery have been reviewed with the patient.  Questions have been answered.  All parties agreeable.   Estanislado Pandy, MD  01/06/2024, 7:21 AM

## 2024-01-06 NOTE — Anesthesia Postprocedure Evaluation (Signed)
 Anesthesia Post Note  Patient: Lee Bryant.  Procedure(s) Performed: CATARACT EXTRACTION PHACO AND INTRAOCULAR LENS PLACEMENT (IOC) RIGHT MALYUGIN OMDIRIA DIABETIC 5.75, 00:42.4 (Right)  Patient location during evaluation: PACU Anesthesia Type: MAC Level of consciousness: awake and alert, oriented and patient cooperative Pain management: pain level controlled Vital Signs Assessment: post-procedure vital signs reviewed and stable Respiratory status: spontaneous breathing, nonlabored ventilation and respiratory function stable Cardiovascular status: blood pressure returned to baseline and stable Postop Assessment: adequate PO intake Anesthetic complications: no   No notable events documented.   Last Vitals:  Vitals:   01/06/24 0800 01/06/24 0804  BP:  (!) 168/80  Pulse: 71 74  Resp: 11 12  Temp:  (!) 36.4 C  SpO2: 95% 94%    Last Pain:  Vitals:   01/06/24 0804  TempSrc:   PainSc: 0-No pain                 Reed Breech

## 2024-01-06 NOTE — Transfer of Care (Signed)
 Immediate Anesthesia Transfer of Care Note  Patient: Lee Bryant.  Procedure(s) Performed: CATARACT EXTRACTION PHACO AND INTRAOCULAR LENS PLACEMENT (IOC) RIGHT MALYUGIN OMDIRIA DIABETIC 5.75, 00:42.4 (Right)  Patient Location: PACU  Anesthesia Type: MAC  Level of Consciousness: awake, alert  and patient cooperative  Airway and Oxygen Therapy: Patient Spontanous Breathing and Patient connected to supplemental oxygen  Post-op Assessment: Post-op Vital signs reviewed, Patient's Cardiovascular Status Stable, Respiratory Function Stable, Patent Airway and No signs of Nausea or vomiting  Post-op Vital Signs: Reviewed and stable  Complications: No notable events documented.

## 2024-01-07 ENCOUNTER — Encounter: Payer: Self-pay | Admitting: Ophthalmology

## 2024-02-17 DIAGNOSIS — N186 End stage renal disease: Secondary | ICD-10-CM | POA: Diagnosis not present

## 2024-02-17 DIAGNOSIS — D509 Iron deficiency anemia, unspecified: Secondary | ICD-10-CM | POA: Diagnosis not present

## 2024-02-17 DIAGNOSIS — Z992 Dependence on renal dialysis: Secondary | ICD-10-CM | POA: Diagnosis not present

## 2024-02-17 DIAGNOSIS — Z23 Encounter for immunization: Secondary | ICD-10-CM | POA: Diagnosis not present

## 2024-03-19 DIAGNOSIS — Z992 Dependence on renal dialysis: Secondary | ICD-10-CM | POA: Diagnosis not present

## 2024-03-19 DIAGNOSIS — N186 End stage renal disease: Secondary | ICD-10-CM | POA: Diagnosis not present

## 2024-03-19 DIAGNOSIS — Z23 Encounter for immunization: Secondary | ICD-10-CM | POA: Diagnosis not present

## 2024-04-17 DIAGNOSIS — N186 End stage renal disease: Secondary | ICD-10-CM | POA: Diagnosis not present

## 2024-04-17 DIAGNOSIS — Z992 Dependence on renal dialysis: Secondary | ICD-10-CM | POA: Diagnosis not present

## 2024-04-18 DIAGNOSIS — N186 End stage renal disease: Secondary | ICD-10-CM | POA: Diagnosis not present

## 2024-04-18 DIAGNOSIS — D509 Iron deficiency anemia, unspecified: Secondary | ICD-10-CM | POA: Diagnosis not present

## 2024-04-18 DIAGNOSIS — Z992 Dependence on renal dialysis: Secondary | ICD-10-CM | POA: Diagnosis not present

## 2024-04-26 DIAGNOSIS — H40003 Preglaucoma, unspecified, bilateral: Secondary | ICD-10-CM | POA: Diagnosis not present

## 2024-05-12 DIAGNOSIS — Z961 Presence of intraocular lens: Secondary | ICD-10-CM | POA: Diagnosis not present

## 2024-05-12 DIAGNOSIS — H40003 Preglaucoma, unspecified, bilateral: Secondary | ICD-10-CM | POA: Diagnosis not present

## 2024-05-18 DIAGNOSIS — N186 End stage renal disease: Secondary | ICD-10-CM | POA: Diagnosis not present

## 2024-05-18 DIAGNOSIS — Z992 Dependence on renal dialysis: Secondary | ICD-10-CM | POA: Diagnosis not present

## 2024-05-19 DIAGNOSIS — N186 End stage renal disease: Secondary | ICD-10-CM | POA: Diagnosis not present

## 2024-05-19 DIAGNOSIS — Z992 Dependence on renal dialysis: Secondary | ICD-10-CM | POA: Diagnosis not present

## 2024-05-19 DIAGNOSIS — D509 Iron deficiency anemia, unspecified: Secondary | ICD-10-CM | POA: Diagnosis not present

## 2024-05-29 DIAGNOSIS — Z992 Dependence on renal dialysis: Secondary | ICD-10-CM | POA: Diagnosis not present

## 2024-05-29 DIAGNOSIS — R200 Anesthesia of skin: Secondary | ICD-10-CM | POA: Diagnosis not present

## 2024-05-29 DIAGNOSIS — R808 Other proteinuria: Secondary | ICD-10-CM | POA: Diagnosis not present

## 2024-05-29 DIAGNOSIS — I10 Essential (primary) hypertension: Secondary | ICD-10-CM | POA: Diagnosis not present

## 2024-05-29 DIAGNOSIS — G8929 Other chronic pain: Secondary | ICD-10-CM | POA: Diagnosis not present

## 2024-05-29 DIAGNOSIS — E11621 Type 2 diabetes mellitus with foot ulcer: Secondary | ICD-10-CM | POA: Diagnosis not present

## 2024-05-29 DIAGNOSIS — N186 End stage renal disease: Secondary | ICD-10-CM | POA: Diagnosis not present

## 2024-05-29 DIAGNOSIS — D631 Anemia in chronic kidney disease: Secondary | ICD-10-CM | POA: Diagnosis not present

## 2024-05-29 DIAGNOSIS — J849 Interstitial pulmonary disease, unspecified: Secondary | ICD-10-CM | POA: Diagnosis not present

## 2024-05-29 DIAGNOSIS — M48062 Spinal stenosis, lumbar region with neurogenic claudication: Secondary | ICD-10-CM | POA: Diagnosis not present

## 2024-05-29 DIAGNOSIS — I2510 Atherosclerotic heart disease of native coronary artery without angina pectoris: Secondary | ICD-10-CM | POA: Diagnosis not present

## 2024-05-29 DIAGNOSIS — Z6834 Body mass index (BMI) 34.0-34.9, adult: Secondary | ICD-10-CM | POA: Diagnosis not present

## 2024-06-05 DIAGNOSIS — R200 Anesthesia of skin: Secondary | ICD-10-CM | POA: Diagnosis not present

## 2024-06-05 DIAGNOSIS — D631 Anemia in chronic kidney disease: Secondary | ICD-10-CM | POA: Diagnosis not present

## 2024-06-05 DIAGNOSIS — R808 Other proteinuria: Secondary | ICD-10-CM | POA: Diagnosis not present

## 2024-06-05 DIAGNOSIS — Z1211 Encounter for screening for malignant neoplasm of colon: Secondary | ICD-10-CM | POA: Diagnosis not present

## 2024-06-05 DIAGNOSIS — G5793 Unspecified mononeuropathy of bilateral lower limbs: Secondary | ICD-10-CM | POA: Diagnosis not present

## 2024-06-05 DIAGNOSIS — E1165 Type 2 diabetes mellitus with hyperglycemia: Secondary | ICD-10-CM | POA: Diagnosis not present

## 2024-06-05 DIAGNOSIS — R29898 Other symptoms and signs involving the musculoskeletal system: Secondary | ICD-10-CM | POA: Diagnosis not present

## 2024-06-05 DIAGNOSIS — N186 End stage renal disease: Secondary | ICD-10-CM | POA: Diagnosis not present

## 2024-06-05 DIAGNOSIS — G8929 Other chronic pain: Secondary | ICD-10-CM | POA: Diagnosis not present

## 2024-06-05 DIAGNOSIS — M545 Low back pain, unspecified: Secondary | ICD-10-CM | POA: Diagnosis not present

## 2024-06-05 DIAGNOSIS — Z6834 Body mass index (BMI) 34.0-34.9, adult: Secondary | ICD-10-CM | POA: Diagnosis not present

## 2024-06-05 DIAGNOSIS — Z992 Dependence on renal dialysis: Secondary | ICD-10-CM | POA: Diagnosis not present

## 2024-06-18 DIAGNOSIS — N186 End stage renal disease: Secondary | ICD-10-CM | POA: Diagnosis not present

## 2024-06-18 DIAGNOSIS — Z992 Dependence on renal dialysis: Secondary | ICD-10-CM | POA: Diagnosis not present

## 2024-06-19 DIAGNOSIS — Z992 Dependence on renal dialysis: Secondary | ICD-10-CM | POA: Diagnosis not present

## 2024-06-19 DIAGNOSIS — D509 Iron deficiency anemia, unspecified: Secondary | ICD-10-CM | POA: Diagnosis not present

## 2024-06-19 DIAGNOSIS — N186 End stage renal disease: Secondary | ICD-10-CM | POA: Diagnosis not present

## 2024-06-26 ENCOUNTER — Other Ambulatory Visit: Payer: Self-pay | Admitting: Family Medicine

## 2024-06-26 DIAGNOSIS — M5416 Radiculopathy, lumbar region: Secondary | ICD-10-CM

## 2024-06-26 DIAGNOSIS — M4802 Spinal stenosis, cervical region: Secondary | ICD-10-CM | POA: Diagnosis not present

## 2024-06-26 DIAGNOSIS — M5412 Radiculopathy, cervical region: Secondary | ICD-10-CM

## 2024-06-26 DIAGNOSIS — M1612 Unilateral primary osteoarthritis, left hip: Secondary | ICD-10-CM | POA: Diagnosis not present

## 2024-06-27 ENCOUNTER — Ambulatory Visit
Admission: RE | Admit: 2024-06-27 | Discharge: 2024-06-27 | Disposition: A | Discharge status: Home / Self Care | Source: Ambulatory Visit | Attending: Family Medicine | Admitting: Family Medicine

## 2024-06-27 DIAGNOSIS — M50221 Other cervical disc displacement at C4-C5 level: Secondary | ICD-10-CM | POA: Diagnosis not present

## 2024-06-27 DIAGNOSIS — M5021 Other cervical disc displacement,  high cervical region: Secondary | ICD-10-CM | POA: Diagnosis not present

## 2024-06-27 DIAGNOSIS — M5126 Other intervertebral disc displacement, lumbar region: Secondary | ICD-10-CM | POA: Diagnosis not present

## 2024-06-27 DIAGNOSIS — M5416 Radiculopathy, lumbar region: Secondary | ICD-10-CM | POA: Diagnosis not present

## 2024-06-27 DIAGNOSIS — M47816 Spondylosis without myelopathy or radiculopathy, lumbar region: Secondary | ICD-10-CM | POA: Diagnosis not present

## 2024-06-27 DIAGNOSIS — M5412 Radiculopathy, cervical region: Secondary | ICD-10-CM

## 2024-06-27 DIAGNOSIS — M48061 Spinal stenosis, lumbar region without neurogenic claudication: Secondary | ICD-10-CM | POA: Diagnosis not present

## 2024-06-27 DIAGNOSIS — M4802 Spinal stenosis, cervical region: Secondary | ICD-10-CM | POA: Diagnosis not present

## 2024-06-27 DIAGNOSIS — M47812 Spondylosis without myelopathy or radiculopathy, cervical region: Secondary | ICD-10-CM | POA: Diagnosis not present

## 2024-06-27 DIAGNOSIS — M5136 Other intervertebral disc degeneration, lumbar region with discogenic back pain only: Secondary | ICD-10-CM | POA: Diagnosis not present

## 2024-06-30 ENCOUNTER — Inpatient Hospital Stay
Admission: RE | Admit: 2024-06-30 | Discharge: 2024-06-30 | Disposition: A | Discharge status: Home / Self Care | Payer: Self-pay | Source: Ambulatory Visit | Attending: Neurosurgery | Admitting: Neurosurgery

## 2024-06-30 ENCOUNTER — Other Ambulatory Visit: Payer: Self-pay | Admitting: Family Medicine

## 2024-06-30 DIAGNOSIS — Z049 Encounter for examination and observation for unspecified reason: Secondary | ICD-10-CM

## 2024-06-30 NOTE — Progress Notes (Deleted)
 Referring Physician:  Carlisle Benton CROME, FNP [address] [place],  [place] [address]  Primary Physician:  Lee Manna, MD  History of Present Illness: 06/30/2024 [name] is here today with a chief complaint of ***  low back pain with left leg numbness and feeling of imbalance  Leg pain? Falls?  Duration: *** Location: *** Quality: *** Severity: ***  Precipitating: aggravated by *** Modifying factors: made better by *** Weakness: none Timing: *** Bowel/Bladder Dysfunction: none  Conservative measures:  Physical therapy: *** has participated in? Where and When? Multimodal medical therapy including regular antiinflammatories: *** Cymbalta , Flexeril , Percocet, Gabapentin  Injections: *** no epidural steroid injections  Past Surgery: ***no spine surgery  Lee Bryant. has ***no symptoms of cervical myelopathy.  The symptoms are causing a significant impact on the patient's life.   I have utilized the care everywhere function in epic to review the outside records available from external health systems.   Review of Systems:  A 10 point review of systems is negative, except for the pertinent positives and negatives detailed in the HPI.  Past Medical History: Past Medical History:  Diagnosis Date   Anemia    Anxiety    CAD (coronary artery disease)    had abnormal nuclear stress test 2021 or 2022   Cellulitis    CKD (chronic kidney disease), stage V (HCC)    Decubitus ulcer of sacral region, stage 4 (HCC)    resolved - required multiple surgical interventions   DM (diabetes mellitus), type 2 (HCC)    GERD (gastroesophageal reflux disease)    Grade II diastolic dysfunction    HLD (hyperlipidemia)    Hypertension    Left leg numbness    intermittent   Leg edema    Mild mitral regurgitation by prior echocardiogram    NSTEMI (non-ST elevated myocardial infarction) (HCC) 07/2022   Obesity (BMI 30-39.9)    Peritoneal dialysis status  [facility])     Past Surgical History: Past Surgical History:  Procedure Laterality Date   CAPD INSERTION N/A 09/16/2022   Procedure: LAPAROSCOPIC INSERTION CONTINUOUS AMBULATORY PERITONEAL DIALYSIS  (CAPD) CATHETER, possible omentopexy;  Surgeon: Lee Shope, MD;  Location: [facility] ORS;  Service: General;  Laterality: N/A;   CATARACT EXTRACTION W/PHACO Left 12/02/2023   Procedure: CATARACT EXTRACTION PHACO AND INTRAOCULAR LENS PLACEMENT (IOC) LEFT DIABETIC OMIDRIA  6.90 00:35.8;  Surgeon: Lee Feliciano Hugger, MD;  Location: [facility];  Service: Ophthalmology;  Laterality: Left;   CATARACT EXTRACTION W/PHACO Right 01/06/2024   Procedure: CATARACT EXTRACTION PHACO AND INTRAOCULAR LENS PLACEMENT (IOC) RIGHT MALYUGIN OMDIRIA DIABETIC 5.75, 00:42.4;  Surgeon: Lee Feliciano Hugger, MD;  Location: [facility];  Service: Ophthalmology;  Laterality: Right;   COLOSTOMY     COLOSTOMY REVERSAL     ducubitus ulcer debridement N/A 2018   LEFT HEART CATH AND CORONARY ANGIOGRAPHY N/A 08/07/2022   Procedure: LEFT HEART CATH AND CORONARY ANGIOGRAPHY;  Surgeon: Lee Cara BIRCH, MD;  Location: [facility] INVASIVE CV LAB;  Service: Cardiovascular;  Laterality: N/A;   TEMPORARY DIALYSIS CATHETER Right 08/07/2022   Procedure: TEMPORARY DIALYSIS CATHETER;  Surgeon: Lee Selinda RAMAN, MD;  Location: [facility] INVASIVE CV LAB;  Service: Cardiovascular;  Laterality: Right;    Allergies: Allergies as of 07/04/2024 - Review Complete 01/06/2024  Allergen Reaction Noted   Cephalexin Rash 10/02/2016    Medications:  Current Outpatient Medications:    Alpha-Lipoic Acid 50 MG TABS, Take by mouth daily., Disp: , Rfl:    amLODipine  (NORVASC )  5 MG tablet, Take 5 mg by mouth every morning., Disp: , Rfl:    aspirin  EC 81 MG tablet, Take 81 mg by mouth daily., Disp: , Rfl:    carvedilol  (COREG ) 3.125 MG tablet, Take 1 tablet (3.125 mg total) by mouth 2 (two) times daily with a meal., Disp: 60 tablet, Rfl: 1    cholecalciferol (VITAMIN D3) 10 MCG (400 UNIT) TABS tablet, Take 400 Units by mouth daily., Disp: , Rfl:    cloNIDine  (CATAPRES ) 0.3 MG tablet, Take 0.3 mg by mouth 2 (two) times daily., Disp: , Rfl:    clopidogrel  (PLAVIX ) 75 MG tablet, Take 1 tablet (75 mg total) by mouth daily., Disp: 30 tablet, Rfl: 1   collagenase  (SANTYL ) ointment, Apply 1 Application topically as needed., Disp: , Rfl:    cyanocobalamin (VITAMIN B12) 1000 MCG tablet, Take 1,000 mcg by mouth daily., Disp: , Rfl:    cyclobenzaprine  (FLEXERIL ) 10 MG tablet, Take 10 mg by mouth 3 (three) times daily as needed. (Patient not taking: Reported on 11/23/2023), Disp: , Rfl:    DOCOSAHEXAENOIC ACID PO, Take by mouth daily., Disp: , Rfl:    doxazosin  (CARDURA ) 4 MG tablet, Take 1 tablet (4 mg total) by mouth at bedtime., Disp: 30 tablet, Rfl: 0   DULoxetine  (CYMBALTA ) 30 MG capsule, Take 30 mg by mouth daily after lunch., Disp: , Rfl:    fenofibrate  (TRICOR ) 48 MG tablet, Take 48 mg by mouth every morning., Disp: , Rfl:    ferrous gluconate  (FERGON) 324 MG tablet, Take 1 tablet (324 mg total) by mouth daily with breakfast. (Patient not taking: Reported on 01/06/2024), Disp: 90 tablet, Rfl: 3   gabapentin  (NEURONTIN ) 600 MG tablet, Take 600 mg by mouth 3 (three) times daily., Disp: , Rfl:    glimepiride (AMARYL) 1 MG tablet, Take 1 mg by mouth daily with breakfast., Disp: , Rfl:    hydrALAZINE  (APRESOLINE ) 25 MG tablet, Take 100 mg by mouth 3 (three) times daily., Disp: , Rfl:    isosorbide  mononitrate (IMDUR ) 30 MG 24 hr tablet, Take 30 mg by mouth every morning., Disp: , Rfl:    losartan  (COZAAR ) 100 MG tablet, Take 100 mg by mouth every morning., Disp: , Rfl:    Multiple Vitamin (MULTIVITAMIN) tablet, Take 1 tablet by mouth daily., Disp: , Rfl:    oxyCODONE -acetaminophen  (PERCOCET/ROXICET) 5-325 MG tablet, Take 2 tablets by mouth 2 (two) times daily., Disp: , Rfl:    rOPINIRole  (REQUIP ) 0.5 MG tablet, Take 1 tablet (0.5 mg total) by mouth  at bedtime., Disp: 30 tablet, Rfl: 1   rosuvastatin  (CRESTOR ) 10 MG tablet, Take 10 mg by mouth at bedtime., Disp: , Rfl:    sodium zirconium cyclosilicate (LOKELMA) 10 g PACK packet, Take 10 g by mouth daily. To start 5 days prior to surgery(PD cath placement) per pts nephrologist (Patient not taking: Reported on 11/23/2023), Disp: , Rfl:    valsartan-hydrochlorothiazide (DIOVAN-HCT) 320-12.5 MG tablet, Take 1 tablet by mouth at bedtime., Disp: , Rfl:   Social History: Social History   Tobacco Use   Smoking status: Never   Smokeless tobacco: Never   Tobacco comments:    when he was 68yrs old.  Vaping Use   Vaping status: Never Used  Substance Use Topics   Alcohol use: Not Currently   Drug use: Yes    Types: Marijuana    Comment: about 1x/week    Family Medical History: Family History  Problem Relation Age of Onset   Cancer Mother  Hypertension Mother    Heart attack Father    Cancer Father    Stroke Sister    Diabetes Brother    Kidney failure Brother     Physical Examination: There were no vitals filed for this visit.  General: Patient is in no apparent distress. Attention to examination is appropriate.  Neck:   Supple.  Full range of motion.  Respiratory: Patient is breathing without any difficulty.   NEUROLOGICAL:     Awake, alert, oriented to person, place, and time.  Speech is clear and fluent.   Cranial Nerves: Pupils equal round and reactive to light.  Facial tone is symmetric.  Facial sensation is symmetric. Shoulder shrug is symmetric. Tongue protrusion is midline.  There is no pronator drift.  Strength: Side Biceps Triceps Deltoid Interossei Grip Wrist Ext. Wrist Flex.  R 5 5 5 5 5 5 5   L 5 5 5 5 5 5 5    Side Iliopsoas Quads Hamstring PF DF EHL  R 5 5 5 5 5 5   L 5 5 5 5 5 5    Reflexes are ***2+ and symmetric at the biceps, triceps, brachioradialis, patella and achilles.   Hoffman's is absent.   Bilateral upper and lower extremity sensation is  intact to light touch.    No evidence of dysmetria noted.  Gait is normal.     Medical Decision Making  Imaging: ***  I have personally reviewed the images and agree with the above interpretation.  Assessment and Plan: Mr. Cosgriff is a pleasant 68 y.o. male with ***      Thank you for involving me in the care of this patient.      Chester K. Clois MD, [facility] Neurosurgery

## 2024-07-04 ENCOUNTER — Ambulatory Visit: Admitting: Neurosurgery

## 2024-07-13 ENCOUNTER — Ambulatory Visit: Admitting: Neurosurgery

## 2024-07-18 DIAGNOSIS — N186 End stage renal disease: Secondary | ICD-10-CM | POA: Diagnosis not present

## 2024-07-18 DIAGNOSIS — Z992 Dependence on renal dialysis: Secondary | ICD-10-CM | POA: Diagnosis not present

## 2024-07-20 DIAGNOSIS — Z992 Dependence on renal dialysis: Secondary | ICD-10-CM | POA: Diagnosis not present

## 2024-07-20 DIAGNOSIS — N186 End stage renal disease: Secondary | ICD-10-CM | POA: Diagnosis not present

## 2024-07-20 DIAGNOSIS — Z23 Encounter for immunization: Secondary | ICD-10-CM | POA: Diagnosis not present

## 2024-07-20 DIAGNOSIS — D509 Iron deficiency anemia, unspecified: Secondary | ICD-10-CM | POA: Diagnosis not present

## 2024-07-21 NOTE — Progress Notes (Signed)
 Referring Physician:  Carlisle Benton CROME, FNP [address] [place],  [place] [address]  Primary Physician:  Lee Manna, MD  History of Present Illness: 07/25/2024 [name] is here today with a chief complaint of left leg numbness and discomfort when he walks over a short distance.  When he sits down, it improves.  This has been bothering him and limiting his day-to-day activity.  He has not tried any conservative management.  He has long history of a neck injury in approximately 2012.  He was recommended to have surgery at that time.  He declined.  He does have balance issues.  He denies any other issues.  Bowel/Bladder Dysfunction: none  Conservative measures: HEP Physical therapy: has not participated in Multimodal medical therapy including regular antiinflammatories:  Cymbalta , Flexeril , Percocet, Gabapentin  Injections: no epidural steroid injections  Past Surgery: no spine surgery  Lee Bryant. has symptoms of cervical myelopathy - imbalance.  The symptoms are causing a significant impact on the patient's life.   I have utilized the care everywhere function in epic to review the outside records available from external health systems.   Review of Systems:  A 10 point review of systems is negative, except for the pertinent positives and negatives detailed in the HPI.  Past Medical History: Past Medical History:  Diagnosis Date   Anemia    Anxiety    CAD (coronary artery disease)    had abnormal nuclear stress test 2021 or 2022   Cellulitis    CKD (chronic kidney disease), stage V (HCC)    Decubitus ulcer of sacral region, stage 4 (HCC)    resolved - required multiple surgical interventions   DM (diabetes mellitus), type 2 (HCC)    GERD (gastroesophageal reflux disease)    Grade II diastolic dysfunction    HLD (hyperlipidemia)    Hypertension    Left leg numbness    intermittent   Leg edema    Mild mitral regurgitation by prior  echocardiogram    NSTEMI (non-ST elevated myocardial infarction) (HCC) 07/2022   Obesity (BMI 30-39.9)    Peritoneal dialysis status     Past Surgical History: Past Surgical History:  Procedure Laterality Date   CAPD INSERTION N/A 09/16/2022   Procedure: LAPAROSCOPIC INSERTION CONTINUOUS AMBULATORY PERITONEAL DIALYSIS  (CAPD) CATHETER, possible omentopexy;  Surgeon: Lane Shope, MD;  Location: [facility];  Service: General;  Laterality: N/A;   CATARACT EXTRACTION W/PHACO Left 12/02/2023   Procedure: CATARACT EXTRACTION PHACO AND INTRAOCULAR LENS PLACEMENT (IOC) LEFT DIABETIC OMIDRIA  6.90 00:35.8;  Surgeon: Enola Feliciano Hugger, MD;  Location: [facility];  Service: Ophthalmology;  Laterality: Left;   CATARACT EXTRACTION W/PHACO Right 01/06/2024   Procedure: CATARACT EXTRACTION PHACO AND INTRAOCULAR LENS PLACEMENT (IOC) RIGHT MALYUGIN OMDIRIA DIABETIC 5.75, 00:42.4;  Surgeon: Enola Feliciano Hugger, MD;  Location: [facility];  Service: Ophthalmology;  Laterality: Right;   COLOSTOMY     COLOSTOMY REVERSAL     ducubitus ulcer debridement N/A 2018   LEFT HEART CATH AND CORONARY ANGIOGRAPHY N/A 08/07/2022   Procedure: LEFT HEART CATH AND CORONARY ANGIOGRAPHY;  Surgeon: Florencio Cara BIRCH, MD;  Location: [facility];  Service: Cardiovascular;  Laterality: N/A;   TEMPORARY DIALYSIS CATHETER Right 08/07/2022   Procedure: TEMPORARY DIALYSIS CATHETER;  Surgeon: Marea Selinda RAMAN, MD;  Location: [facility];  Service: Cardiovascular;  Laterality: Right;    Allergies: Allergies as of 07/25/2024 - Review Complete 01/06/2024  Allergen Reaction Noted  Cephalexin Rash 10/02/2016    Medications:  Current Outpatient Medications:    Alpha-Lipoic Acid 50 MG TABS, Take by mouth daily., Disp: , Rfl:    amLODipine  (NORVASC ) 5 MG tablet, Take 5 mg by mouth every morning., Disp: , Rfl:    aspirin  EC 81 MG tablet, Take 81 mg by mouth daily., Disp: , Rfl:    carvedilol   (COREG ) 3.125 MG tablet, Take 1 tablet (3.125 mg total) by mouth 2 (two) times daily with a meal., Disp: 60 tablet, Rfl: 1   cholecalciferol (VITAMIN D3) 10 MCG (400 UNIT) TABS tablet, Take 400 Units by mouth daily., Disp: , Rfl:    cloNIDine  (CATAPRES ) 0.3 MG tablet, Take 0.3 mg by mouth 2 (two) times daily., Disp: , Rfl:    clopidogrel  (PLAVIX ) 75 MG tablet, Take 1 tablet (75 mg total) by mouth daily., Disp: 30 tablet, Rfl: 1   collagenase  (SANTYL ) ointment, Apply 1 Application topically as needed., Disp: , Rfl:    cyanocobalamin (VITAMIN B12) 1000 MCG tablet, Take 1,000 mcg by mouth daily., Disp: , Rfl:    cyclobenzaprine  (FLEXERIL ) 10 MG tablet, Take 10 mg by mouth 3 (three) times daily as needed., Disp: , Rfl:    DOCOSAHEXAENOIC ACID PO, Take by mouth daily., Disp: , Rfl:    doxazosin  (CARDURA ) 4 MG tablet, Take 1 tablet (4 mg total) by mouth at bedtime., Disp: 30 tablet, Rfl: 0   DULoxetine  (CYMBALTA ) 30 MG capsule, Take 30 mg by mouth daily after lunch., Disp: , Rfl:    fenofibrate  (TRICOR ) 48 MG tablet, Take 48 mg by mouth every morning., Disp: , Rfl:    ferrous gluconate  (FERGON) 324 MG tablet, Take 1 tablet (324 mg total) by mouth daily with breakfast., Disp: 90 tablet, Rfl: 3   gabapentin  (NEURONTIN ) 600 MG tablet, Take 600 mg by mouth 3 (three) times daily., Disp: , Rfl:    glimepiride (AMARYL) 1 MG tablet, Take 1 mg by mouth daily with breakfast., Disp: , Rfl:    hydrALAZINE  (APRESOLINE ) 25 MG tablet, Take 100 mg by mouth 3 (three) times daily., Disp: , Rfl:    isosorbide  mononitrate (IMDUR ) 30 MG 24 hr tablet, Take 30 mg by mouth every morning., Disp: , Rfl:    losartan  (COZAAR ) 100 MG tablet, Take 100 mg by mouth every morning., Disp: , Rfl:    Multiple Vitamin (MULTIVITAMIN) tablet, Take 1 tablet by mouth daily., Disp: , Rfl:    oxyCODONE -acetaminophen  (PERCOCET/ROXICET) 5-325 MG tablet, Take 2 tablets by mouth 2 (two) times daily., Disp: , Rfl:    rOPINIRole  (REQUIP ) 0.5 MG  tablet, Take 1 tablet (0.5 mg total) by mouth at bedtime., Disp: 30 tablet, Rfl: 1   rosuvastatin  (CRESTOR ) 10 MG tablet, Take 10 mg by mouth at bedtime., Disp: , Rfl:    sodium zirconium cyclosilicate (LOKELMA) 10 g PACK packet, Take 10 g by mouth daily. To start 5 days prior to surgery(PD cath placement) per pts nephrologist, Disp: , Rfl:    valsartan-hydrochlorothiazide (DIOVAN-HCT) 320-12.5 MG tablet, Take 1 tablet by mouth at bedtime., Disp: , Rfl:   Social History: Social History   Tobacco Use   Smoking status: Never   Smokeless tobacco: Never   Tobacco comments:    when he was 68yrs old.  Vaping Use   Vaping status: Never Used  Substance Use Topics   Alcohol use: Not Currently   Drug use: Yes    Types: Marijuana    Comment: about 1x/week    Family Medical History:  Family History  Problem Relation Age of Onset   Cancer Mother    Hypertension Mother    Heart attack Father    Cancer Father    Stroke Sister    Diabetes Brother    Kidney failure Brother     Physical Examination: Vitals:   07/25/24 1607  BP: 126/82    General: Patient is in no apparent distress. Attention to examination is appropriate.  Neck:   Supple.  Full range of motion.  Respiratory: Patient is breathing without any difficulty.   NEUROLOGICAL:     Awake, alert, oriented to person, place, and time.  Speech is clear and fluent.   Cranial Nerves: Pupils equal round and reactive to light.  Facial tone is symmetric.  Facial sensation is symmetric. Shoulder shrug is symmetric. Tongue protrusion is midline.  There is no pronator drift.  Strength: Side Biceps Triceps Deltoid Interossei Grip Wrist Ext. Wrist Flex.  R 5 5 5 5 5 5 5   L 5 5 5 5 5 5 5    Side Iliopsoas Quads Hamstring PF DF EHL  R 5 5 5 5 5 5   L 5 5 5 5 5 5    Reflexes are 2+ and symmetric at the biceps, triceps, brachioradialis, patella and achilles.   Hoffman's is absent.   Bilateral upper and lower extremity sensation is  intact to light touch.    No evidence of dysmetria noted.  Gait is wide-based.  He cannot perform tandem gait.     Medical Decision Making  Imaging: MRI cervical spine 06/27/2024 IMPRESSION: 1. Advanced multilevel cervical spondylosis with resultant moderate to severe diffuse spinal stenosis at C3-4 through C6-7, most pronounced at C4-5. 2. Patchy cord signal abnormality at the level of C4-5, consistent with chronic myelomalacia. 3. Multifactorial degenerative changes with resultant multilevel foraminal narrowing as above. Notable findings include severe left C4 foraminal stenosis, severe right C5 and C6 foraminal narrowing, with moderate left C7 foraminal stenosis.     Electronically Signed   By: Morene Hoard M.D.   On: 06/27/2024 18:51  MRI lumbar spine 06/27/2024 IMPRESSION: 1. Multifactorial degenerative changes at L3-4 and L4-5 with resultant moderate spinal stenosis, with moderate to severe bilateral L3 and L4 foraminal narrowing. 2. Right subarticular disc extrusion with inferior migration at L1-2 with resultant mild canal and moderate right lateral recess stenosis. 3. Moderate bilateral L5 foraminal stenosis related to disc bulge, reactive endplate spurring, and facet hypertrophy.     Electronically Signed   By: Morene Hoard M.D.   On: 06/27/2024 18:58 I have personally reviewed the images and agree with the above interpretation.  Assessment and Plan: Mr. Buchberger is a pleasant 68 y.o. male with severe cervical stenosis with history of incomplete spinal cord injury in 2012.  He has obvious imbalance.  He has symptomatic cervical myelopathy.  I recommended surgical intervention for this.  I would typically recommend a C3-7 anterior cervical discectomy and fusion given his multilevel disease.  He has declined that for now.  He also has lumbar stenosis causing neurogenic claudication.  I will send him to physical therapy and for an injection.  I  discussed with him that he would need to have intervention on his neck before considering any lower back intervention.  He expressed understanding.  I will see him back in 8 weeks.  I spent a total of 30 minutes in this patient's care today. This time was spent reviewing pertinent records including imaging studies, obtaining and confirming history, performing a directed  evaluation, formulating and discussing my recommendations, and documenting the visit within the medical record.        Thank you for involving me in the care of this patient.      Georgean Spainhower K. Clois MD, [facility]

## 2024-07-25 ENCOUNTER — Ambulatory Visit: Admitting: Neurosurgery

## 2024-07-25 ENCOUNTER — Encounter: Payer: Self-pay | Admitting: Neurosurgery

## 2024-07-25 VITALS — BP 126/82 | Ht 73.0 in | Wt 253.1 lb

## 2024-07-25 DIAGNOSIS — M48062 Spinal stenosis, lumbar region with neurogenic claudication: Secondary | ICD-10-CM

## 2024-07-25 DIAGNOSIS — G9589 Other specified diseases of spinal cord: Secondary | ICD-10-CM

## 2024-07-25 DIAGNOSIS — M4802 Spinal stenosis, cervical region: Secondary | ICD-10-CM | POA: Diagnosis not present

## 2024-07-25 DIAGNOSIS — G959 Disease of spinal cord, unspecified: Secondary | ICD-10-CM

## 2024-08-18 DIAGNOSIS — Z992 Dependence on renal dialysis: Secondary | ICD-10-CM | POA: Diagnosis not present

## 2024-08-18 DIAGNOSIS — N186 End stage renal disease: Secondary | ICD-10-CM | POA: Diagnosis not present

## 2024-08-20 DIAGNOSIS — Z992 Dependence on renal dialysis: Secondary | ICD-10-CM | POA: Diagnosis not present

## 2024-08-20 DIAGNOSIS — D509 Iron deficiency anemia, unspecified: Secondary | ICD-10-CM | POA: Diagnosis not present

## 2024-09-17 DIAGNOSIS — Z992 Dependence on renal dialysis: Secondary | ICD-10-CM | POA: Diagnosis not present

## 2024-09-17 DIAGNOSIS — N186 End stage renal disease: Secondary | ICD-10-CM | POA: Diagnosis not present

## 2024-09-19 ENCOUNTER — Ambulatory Visit: Admitting: Neurosurgery
